# Patient Record
Sex: Male | Born: 1990
Health system: Southern US, Community
[De-identification: ages and names within clinical notes are randomized; demographics above are authoritative.]

## PROBLEM LIST (undated history)

## (undated) ENCOUNTER — Emergency Department (HOSPITAL_COMMUNITY): Disposition: A | Discharge status: Home / Self Care | Payer: Self-pay

## (undated) DIAGNOSIS — J45909 Unspecified asthma, uncomplicated: Secondary | ICD-10-CM

## (undated) DIAGNOSIS — M549 Dorsalgia, unspecified: Secondary | ICD-10-CM

## (undated) HISTORY — PX: TYMPANOSTOMY TUBE PLACEMENT: SHX32

---
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
## 2011-01-08 NOTE — Discharge Summary (Signed)
[name], [name]              ACCOUNT NO.:  [id number]   MEDICAL RECORD NO.:  [id number]          PATIENT TYPE:  INP   LOCATION:  0204                          FACILITY:  [facility]   PHYSICIAN:  Lalla Brothers, MDDATE OF BIRTH:  11/21/1990   DATE OF ADMISSION:  10/25/2005  DATE OF DISCHARGE:  11/01/2005                                 DISCHARGE SUMMARY   IDENTIFICATION:  This 67-1/20-year-old male, eighth grade student at  [employer], was admitted emergently voluntarily in  transfer from [facility] Emergency Department for inpatient  stabilization and treatment of suicide risk and dangerous manic disruptive  behavior.  The patient was injuring his head and hand by striking them on  objects despite 1-1/2 months of outpatient treatment at [facility].  He has not benefited thus far from Wellbutrin 150 mg XL  every morning and clonidine 0.2 mg at bedtime except he can get off to sleep  a little better but cannot stay asleep, becoming cumulatively sleep-  deprived.  For full details, please see the typed admission assessment.   SYNOPSIS OF PRESENT ILLNESS:  The patient had been in the emergency  department September 24, 2005 for suicidal ideation and had a positive urine  cannabis at that time.  The patient is having severe mood swings with  unpredictable loss of control and cannot contract for safety at the time of  admission.  He has a court date for stealing a wallet with the court hearing  November 01, 2005.  Last cannabis reportedly was one week ago.  He smokes one  pack per week of cigarettes for the last year.  He is stressed by father  taking 19 medications including oxygen and insulin and being depressed.  Sister is depressed.  Mother was hospitalized recently for depression,  apparently being discharged one week ago.  The patient has an angulation  deformity of the right fifth metacarpal from a boxer's fracture in November  of  2005.  He is again striking this same fist on objects.  Both parents are  on antidepressants with mother having history of alcohol abuse.  Parents  report they have bipolar disorder.  The patient reports dyspnea episodically  and uses an albuterol inhaler MDI as needed.  He is not sexually active  historically.  The patient is not employed.   INITIAL MENTAL STATUS EXAM:  There was no organicity evident.  The patient  had a highly labile mood, varying from severe dysphoria to hypomanic denial.  Manic activation was often accompanied with rage.  He was unrealistic and  inappropriate in mood for disruptiveness.  He was hypersensitive with  rejection sensitivity.  He was disorganized but had no definite  hallucinations.  He remained passively suicidal, aggressive and self-  destructive.   LABORATORY DATA:  CBC was normal except hemoconcentration with hemoglobin  15.4 with upper limit of normal 14.6 with hematocrit 45.6 with upper limit  of normal 44.  White count was normal at 10,400, MCV at 87.4 and platelet  count 376,000.  Venous blood gas was normal with pH 7.295  and PCO2 of 59  with bicarb 29.  Basic metabolic panel initially revealed sodium normal at  139, potassium 4.6, BUN 13 and random glucose 93.  Basic metabolic panel,  the following day, revealed sodium normal at 139, potassium 4.3, random  glucose 88, creatinine 0.9, calcium 9.9, albumin 4.6, AST 19, ALT 12, GGT  13.  Three days later, on Depakote 1000 mg nightly, the patient's sodium was  normal at 140, potassium 3.9, fasting glucose 89, creatinine 0.9, calcium  9.4, albumin 4, AST 17 and ALT 11.  Lipase was normal at 23.  Free T4 was  normal at 1.26 and TSH at 2.053.  After two nights of Depakote 1000 mg ER at  bedtime, the patient's 10-hour Depakote level was 59 mcg/mL.  After two  additional nights of Depakote 1500 mg ER nightly, the patient's Depakote  level trough value just before the next evening dose was 117 mcg/mL and  the  patient's Depakote was held for one day and restarted at a reduced dose.  Urine drug screen was positive for cannabis; otherwise negative including  blood alcohol and such was not quantitated.  Urinalysis was normal with  specific gravity of 1.021 and pH 7.  RPR was nonreactive.  Urine probe for  gonorrhea and chlamydia trachomatis by DNA amplification were both negative.  X-ray of the right hand in the emergency department revealed an old healed  angulated right fifth metacarpal fracture and mild soft tissue swelling over  third and fourth metacarpal; otherwise negative with a 15 degree angulation.   HOSPITAL COURSE AND TREATMENT:  General medical exam, by Yolande Jolly, P.A.-  C., noted negative exam except contusion of the right hand.  Exam was  otherwise intact and the patient denied sexual activity.  He had significant  orthostasis on admission, being underhydrated and undernourished.  His blood  pressure was 125/74 with heart rate of 54 (supine) and 88/48 with heart rate  of 80 (standing).  On the subsequent hospital day, the blood pressure was  135/72 with heart rate of 53 (supine) and 108/69 with heart rate of 94  (standing).  Vital signs gradually normalized as Depakote was titrated up  and trazodone was used p.r.n. for sleep.  Trazodone was gradually decreased  as Depakote established efficacy.  The patient developed viral pharyngitis  and gastritis like several other peers on the unit, such that his Depakote  was held.  The patient did manifest gradual progress during the hospital  stay.  By the time of discharge, clinically he was having little or no  Depakote side effects and crisis and safety plans were outlined if needed.  The patient was discharged home in improved condition.  He had no other  intraabdominal organ dysfunction.  The patient required no seclusion or  restraint during the hospital stay.  He made some definite progress, particularly in his ability to be  sincere and verbal in family sessions with  parents.  He had to work through his demands to leave which he generally  directed towards parents several times but, by the time of discharge, he had  disengaged from such coping by anger and manipulation.  They addressed  specifically improved family communication and function without substance  use by the time of discharge.  The patient had court to attend and then  would have two days before he returned to school in order to reestablish  function with the family.  Depakote was decreased at the time of discharge.  He  required no seclusion or restraint during the hospital stay.   FINAL DIAGNOSES:  AXIS I:  Bipolar disorder, mixed, severe.  Oppositional  defiant disorder.  Cannabis abuse.  Parent-child problem.  Other specified  family circumstances.  Rule out attention-deficit hyperactivity disorder  (provisional diagnosis).  AXIS II:  Diagnosis deferred.  AXIS III:  Contusion, right hand with healed angulated right fifth  metacarpal fracture from the past, cigarette and cannabis smoking with  episodic dyspnea, to rule out asthma.  AXIS IV:  Stressors:  Family--severe, acute and chronic; phase of life--  severe, acute and chronic; school--moderate, acute and chronic.  AXIS V:  GAF on admission 36; highest in the last year 65; discharge GAF 52.   CONDITION ON DISCHARGE:  The patient is discharged to parents in improved  condition, free of suicidal ideation.   ACTIVITY/DIET:  He follows a regular diet and has no restrictions on  physical activity.  Crisis and safety plans are outlined if needed.  He is  prescribed the following medication.   DISCHARGE MEDICATIONS:  1.  Depakote 500 mg ER, to take 2 tablets every bedtime; quantity #60 with      no refill prescribed.  2.  Trazodone 50 mg tablet, to take 1/2 or 1 at bedtime if needed for      insomnia, quantity #30 with no refill.  3.  He has albuterol inhaler at home, to follow established  directions if      needed.   Wellbutrin and clonidine were discontinued.   FOLLOW UP:  He will see Eugenia Mcalpine November 05, 2005 at 11 a.m. for therapy.  He will see Dr. Lucianne Muss for medication management November 02, 2005 at 10:45 a.m.      Lalla Brothers, MD  Electronically Signed     GEJ/MEDQ  D:  11/05/2005  T:  11/06/2005  Job:  [id number]   cc:   Dr. Leander Rams [facility]  [address]  [place], [place]  fax [phone]   Eugenia Mcalpine  [facility]  [address]  [place], [place]  fax [phone]

## 2011-05-14 LAB — DIFFERENTIAL
Basophils Absolute: 0
Basophils Relative: 0
Eosinophils Absolute: 0.1
Eosinophils Relative: 2
Lymphocytes Relative: 24
Lymphs Abs: 1.7
Monocytes Absolute: 0.7
Monocytes Relative: 10
Neutro Abs: 4.5
Neutrophils Relative %: 64

## 2011-05-14 LAB — CBC
HCT: 44.8
Hemoglobin: 16
MCHC: 35.7
MCV: 85.6
Platelets: 244
RBC: 5.23
RDW: 12.6
WBC: 7.1

## 2011-05-14 LAB — BASIC METABOLIC PANEL
BUN: 13
CO2: 25
Calcium: 9.5
Chloride: 101
Creatinine, Ser: 0.91
Glucose, Bld: 92
Potassium: 3.3 — ABNORMAL LOW
Sodium: 136

## 2011-05-14 LAB — URINALYSIS, ROUTINE W REFLEX MICROSCOPIC
Glucose, UA: NEGATIVE
Hgb urine dipstick: NEGATIVE
Ketones, ur: 15 — AB
Leukocytes, UA: NEGATIVE
Nitrite: NEGATIVE
Protein, ur: 30 — AB
Specific Gravity, Urine: >1.03 — ABNORMAL HIGH
Urobilinogen, UA: 0.2
pH: 6

## 2011-05-14 LAB — RAPID URINE DRUG SCREEN, HOSP PERFORMED
Amphetamines: NOT DETECTED
Barbiturates: NOT DETECTED
Benzodiazepines: NOT DETECTED
Cocaine: NOT DETECTED
Opiates: NOT DETECTED
Tetrahydrocannabinol: POSITIVE — AB

## 2011-05-14 LAB — URINE MICROSCOPIC-ADD ON

## 2011-05-14 LAB — VALPROIC ACID LEVEL: Valproic Acid Lvl: 45 — ABNORMAL LOW

## 2011-05-14 LAB — ETHANOL: Alcohol, Ethyl (B): <5

## 2011-06-07 LAB — DIFFERENTIAL
Basophils Absolute: 0
Eosinophils Relative: 1
Lymphocytes Relative: 22 — ABNORMAL LOW
Lymphs Abs: 1.6
Neutro Abs: 4.8

## 2011-06-07 LAB — URINALYSIS, ROUTINE W REFLEX MICROSCOPIC
Bilirubin Urine: NEGATIVE
Glucose, UA: NEGATIVE
Hgb urine dipstick: NEGATIVE
Ketones, ur: NEGATIVE
Nitrite: NEGATIVE
Protein, ur: NEGATIVE
Specific Gravity, Urine: 1.028
Urobilinogen, UA: 1
pH: 6

## 2011-06-07 LAB — RAPID URINE DRUG SCREEN, HOSP PERFORMED
Amphetamines: NOT DETECTED
Tetrahydrocannabinol: POSITIVE — AB

## 2011-06-07 LAB — HEPATIC FUNCTION PANEL
ALT: 12
ALT: 12
AST: 14
AST: 18
Albumin: 3.8
Albumin: 4
Alkaline Phosphatase: 69 — ABNORMAL LOW
Bilirubin, Direct: 0.1
Bilirubin, Direct: 0.2
Indirect Bilirubin: 1.3 — ABNORMAL HIGH
Total Bilirubin: 0.5
Total Bilirubin: 1.5 — ABNORMAL HIGH
Total Protein: 7.1

## 2011-06-07 LAB — CBC
HCT: 49 — ABNORMAL HIGH
Platelets: 288
RDW: 12.7
WBC: 7.2

## 2011-06-07 LAB — LIPID PANEL
Cholesterol: 152
HDL: 31 — ABNORMAL LOW
LDL Cholesterol: 89
Total CHOL/HDL Ratio: 4.9
Triglycerides: 161 — ABNORMAL HIGH
VLDL: 32

## 2011-06-07 LAB — RPR: RPR Ser Ql: NONREACTIVE

## 2011-06-07 LAB — TSH: TSH: 1.284

## 2011-06-07 LAB — BASIC METABOLIC PANEL
BUN: 10
Calcium: 9.7
Potassium: 3.4 — ABNORMAL LOW
Sodium: 137

## 2011-06-07 LAB — GAMMA GT: GGT: 12

## 2011-06-07 LAB — GC/CHLAMYDIA PROBE AMP, URINE
Chlamydia, Swab/Urine, PCR: NEGATIVE
GC Probe Amp, Urine: NEGATIVE

## 2011-06-07 LAB — T4, FREE: Free T4: 1.12

## 2011-06-07 LAB — ETHANOL: Alcohol, Ethyl (B): <5

## 2011-06-07 LAB — RAPID STREP SCREEN (MED CTR MEBANE ONLY): Streptococcus, Group A Screen (Direct): NEGATIVE

## 2013-02-25 ENCOUNTER — Encounter (HOSPITAL_COMMUNITY): Payer: Self-pay | Admitting: Emergency Medicine

## 2013-02-25 ENCOUNTER — Emergency Department (HOSPITAL_COMMUNITY)
Admission: EM | Admit: 2013-02-25 | Discharge: 2013-02-26 | Disposition: A | Discharge status: Home / Self Care | Payer: Self-pay | Attending: Emergency Medicine | Admitting: Emergency Medicine

## 2013-02-25 DIAGNOSIS — Y929 Unspecified place or not applicable: Secondary | ICD-10-CM | POA: Insufficient documentation

## 2013-02-25 DIAGNOSIS — Y9319 Activity, other involving water and watercraft: Secondary | ICD-10-CM | POA: Insufficient documentation

## 2013-02-25 DIAGNOSIS — H7321 Unspecified myringitis, right ear: Secondary | ICD-10-CM

## 2013-02-25 DIAGNOSIS — S199XXA Unspecified injury of neck, initial encounter: Secondary | ICD-10-CM | POA: Insufficient documentation

## 2013-02-25 DIAGNOSIS — S0993XA Unspecified injury of face, initial encounter: Secondary | ICD-10-CM | POA: Insufficient documentation

## 2013-02-25 DIAGNOSIS — H7290 Unspecified perforation of tympanic membrane, unspecified ear: Secondary | ICD-10-CM | POA: Insufficient documentation

## 2013-02-25 DIAGNOSIS — W1809XA Striking against other object with subsequent fall, initial encounter: Secondary | ICD-10-CM | POA: Insufficient documentation

## 2013-02-25 MED ORDER — ANTIPYRINE-BENZOCAINE 5.4-1.4 % OT SOLN: 3.0000 [drp] | Freq: Four times a day (QID) | OTIC | Status: DC | PRN

## 2013-02-25 MED ORDER — NAPROXEN 500 MG PO TABS: 500.0000 mg | ORAL_TABLET | Freq: Two times a day (BID) | ORAL | Status: DC

## 2013-02-25 MED ORDER — ONDANSETRON 4 MG PO TBDP
8.0000 mg | ORAL_TABLET | Freq: Once | ORAL | Status: AC
  Administered 2013-02-26: 8 mg via ORAL
  Filled 2013-02-25: qty 2

## 2013-02-25 MED ORDER — OXYCODONE-ACETAMINOPHEN 5-325 MG PO TABS
2.0000 | ORAL_TABLET | Freq: Once | ORAL | Status: AC
  Administered 2013-02-25: 2 via ORAL
  Filled 2013-02-25: qty 2

## 2013-02-25 MED ORDER — HYDROCODONE-ACETAMINOPHEN 5-325 MG PO TABS: 2.0000 | ORAL_TABLET | ORAL | Status: DC | PRN
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
## 2013-07-20 ENCOUNTER — Encounter (HOSPITAL_BASED_OUTPATIENT_CLINIC_OR_DEPARTMENT_OTHER): Payer: Self-pay | Admitting: Emergency Medicine

## 2013-07-20 ENCOUNTER — Emergency Department (HOSPITAL_BASED_OUTPATIENT_CLINIC_OR_DEPARTMENT_OTHER)
Admission: EM | Admit: 2013-07-20 | Discharge: 2013-07-20 | Disposition: A | Discharge status: Home / Self Care | Payer: Self-pay | Attending: Emergency Medicine | Admitting: Emergency Medicine

## 2013-07-20 ENCOUNTER — Emergency Department (HOSPITAL_BASED_OUTPATIENT_CLINIC_OR_DEPARTMENT_OTHER): Payer: Self-pay

## 2013-07-20 DIAGNOSIS — X500XXA Overexertion from strenuous movement or load, initial encounter: Secondary | ICD-10-CM | POA: Insufficient documentation

## 2013-07-20 DIAGNOSIS — Z791 Long term (current) use of non-steroidal anti-inflammatories (NSAID): Secondary | ICD-10-CM | POA: Insufficient documentation

## 2013-07-20 DIAGNOSIS — S93602A Unspecified sprain of left foot, initial encounter: Secondary | ICD-10-CM

## 2013-07-20 DIAGNOSIS — Y9239 Other specified sports and athletic area as the place of occurrence of the external cause: Secondary | ICD-10-CM | POA: Insufficient documentation

## 2013-07-20 DIAGNOSIS — Y9351 Activity, roller skating (inline) and skateboarding: Secondary | ICD-10-CM | POA: Insufficient documentation

## 2013-07-20 DIAGNOSIS — S93609A Unspecified sprain of unspecified foot, initial encounter: Secondary | ICD-10-CM | POA: Insufficient documentation

## 2013-07-20 DIAGNOSIS — F17200 Nicotine dependence, unspecified, uncomplicated: Secondary | ICD-10-CM | POA: Insufficient documentation

## 2013-07-20 IMAGING — CR DG ANKLE COMPLETE 3+V*L*
3 series · 3 of 3 positions shown · non-contrast
Comparison: [DATE]

CLINICAL DATA: Skateboarding injury, ankle pain

EXAM:
LEFT ANKLE COMPLETE - 3+ VIEW

[t ankle joint ap left]
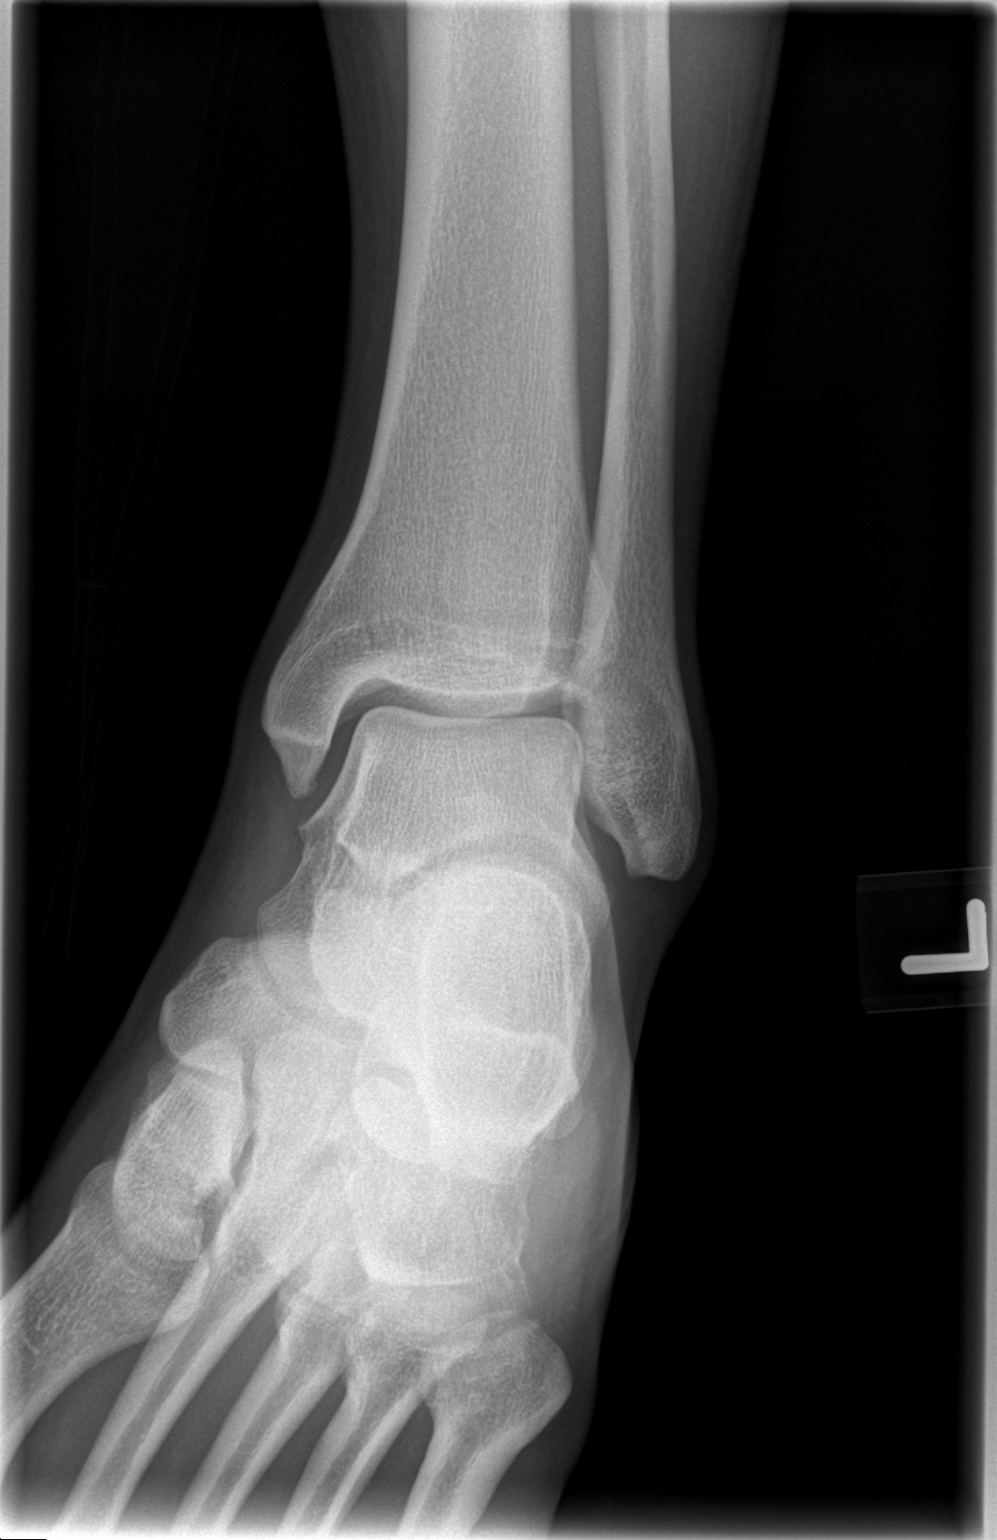

[t ankle joint oblique left]
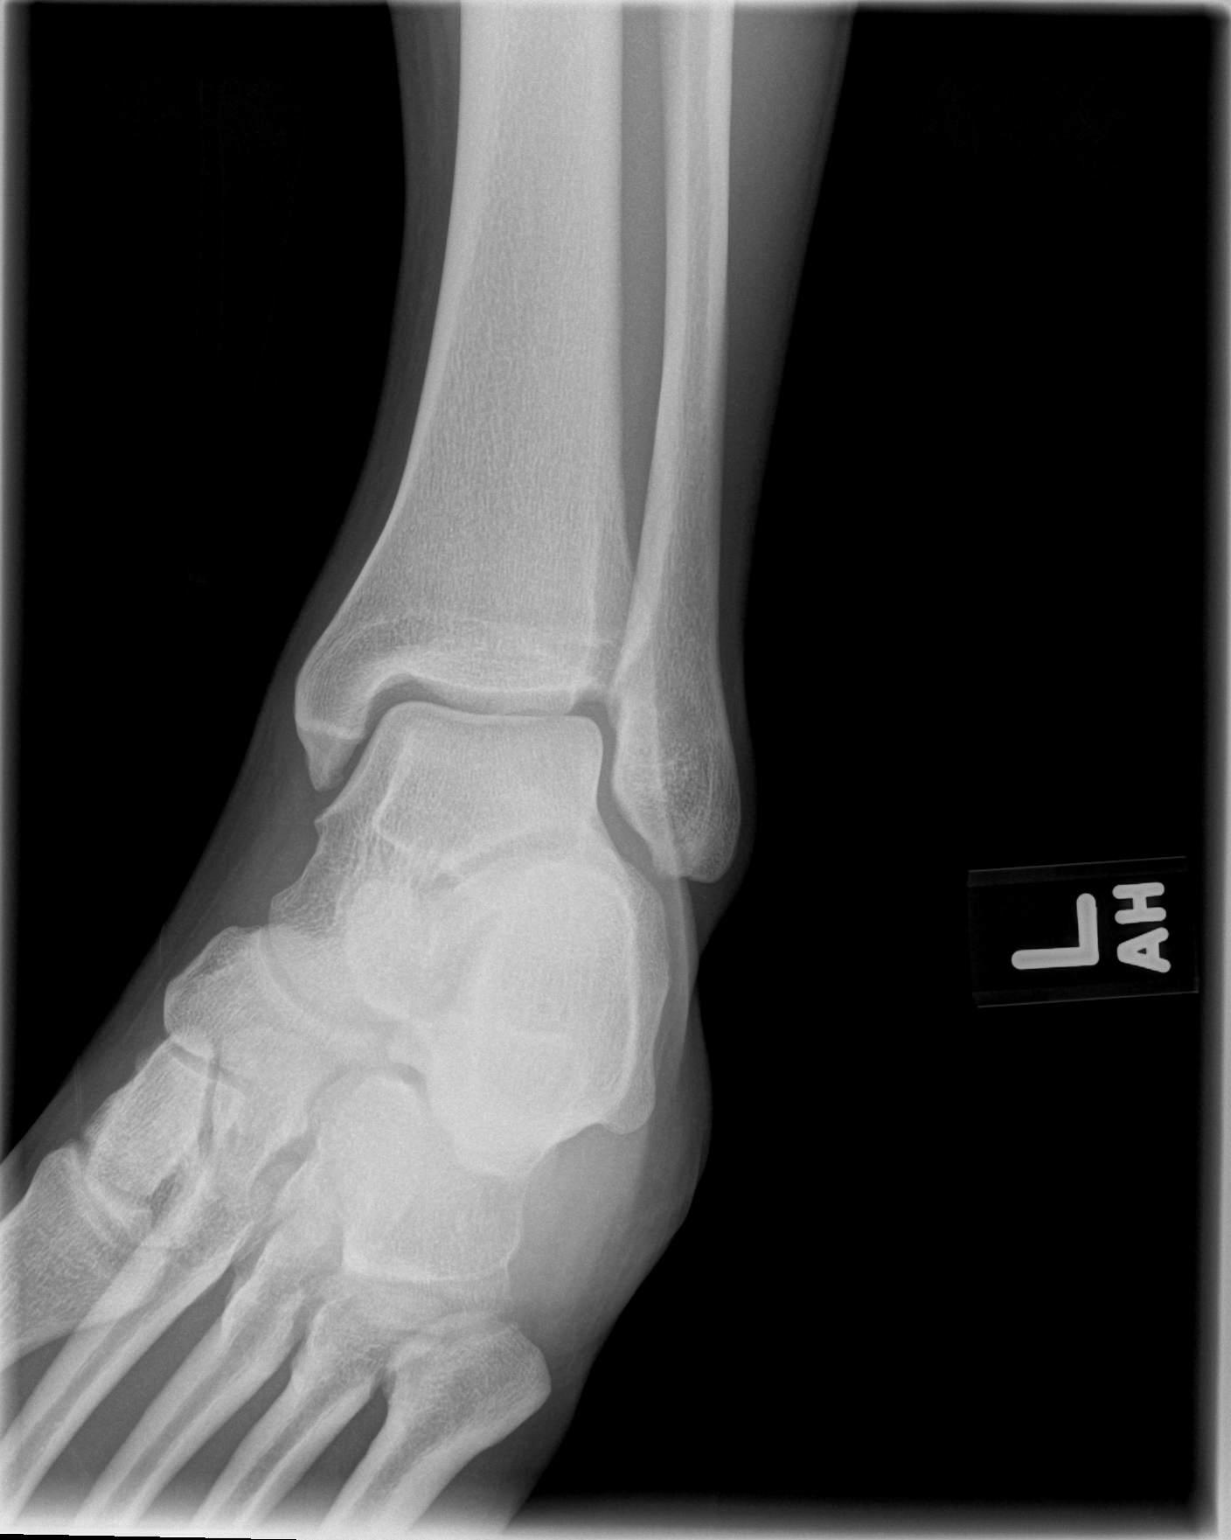

[t ankle joint lat left]
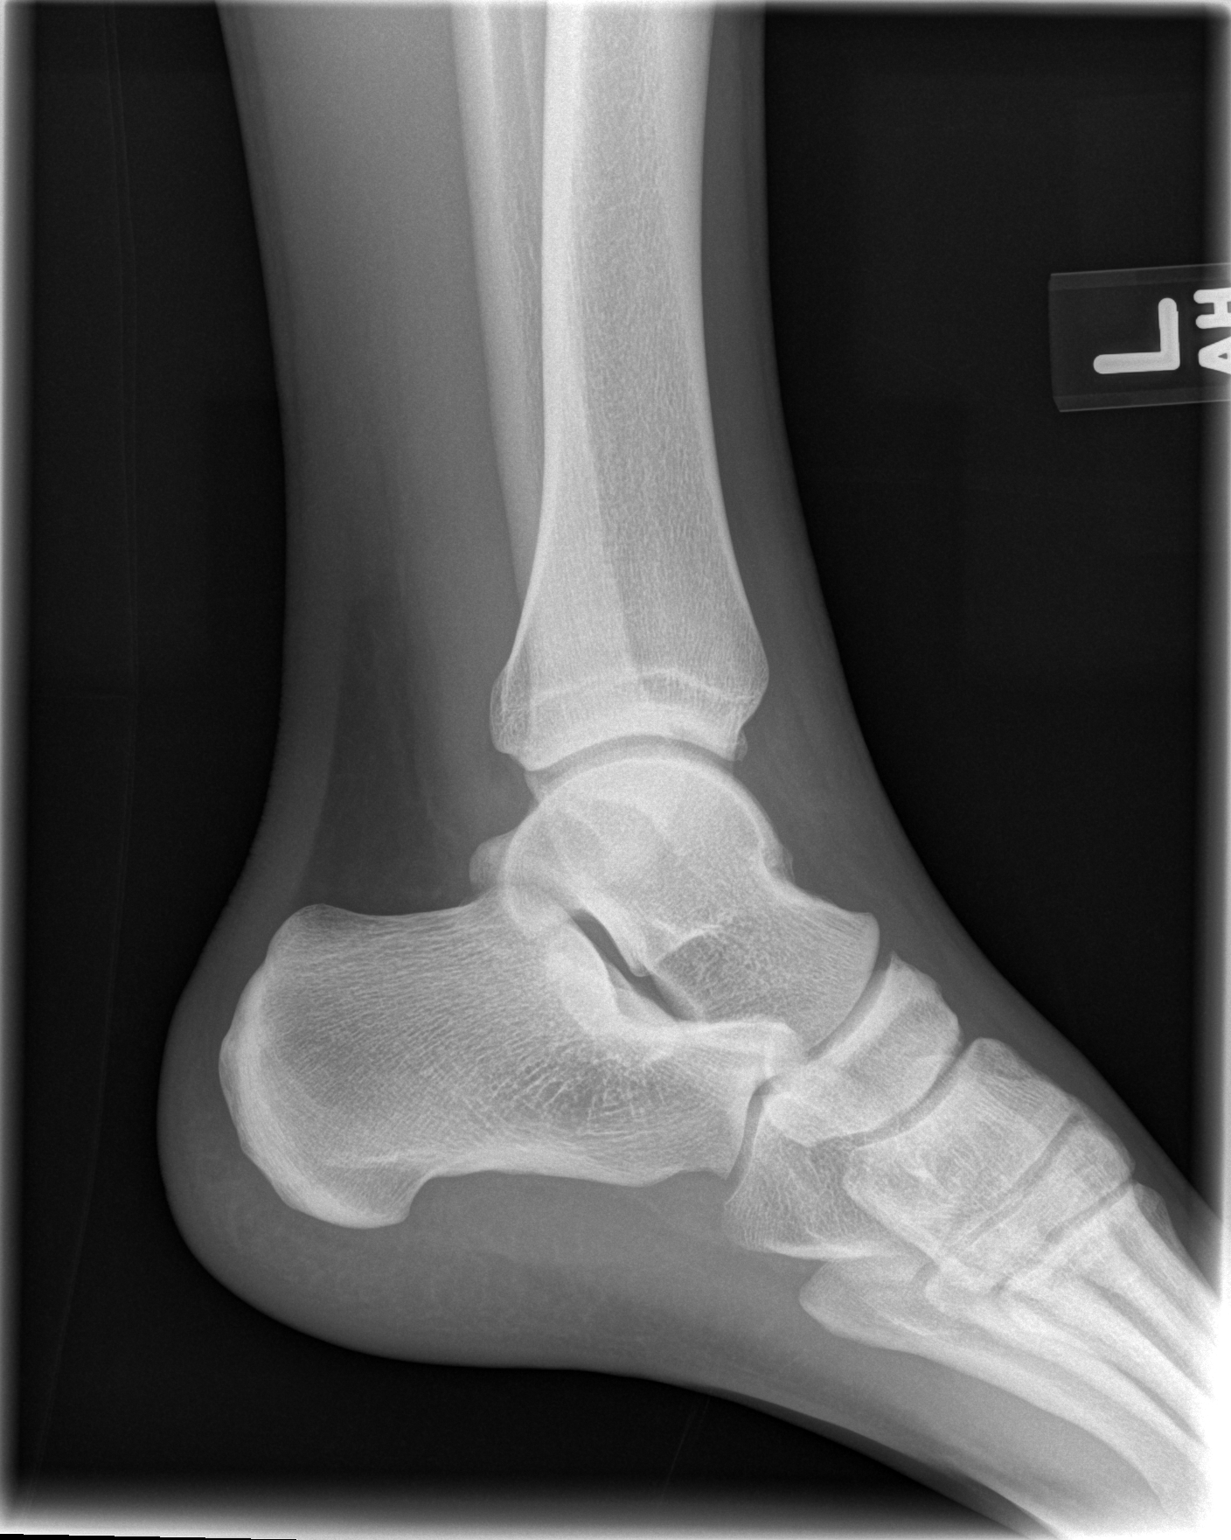

[3 of 3 positions shown; findings below may reference images not displayed]

FINDINGS: No fracture or dislocation is seen.

The ankle mortise is intact.

The base of the fifth metatarsal is unremarkable.

The visualized soft tissues are unremarkable.
IMPRESSION: No fracture or dislocation is seen.

## 2013-07-20 MED ORDER — IBUPROFEN 800 MG PO TABS: 800.0000 mg | ORAL_TABLET | Freq: Three times a day (TID) | ORAL | Status: DC

## 2013-07-20 MED ORDER — HYDROCODONE-ACETAMINOPHEN 5-325 MG PO TABS
2.0000 | ORAL_TABLET | Freq: Once | ORAL | Status: AC
  Administered 2013-07-20: 2 via ORAL
  Filled 2013-07-20: qty 2

## 2013-07-20 MED ORDER — HYDROCODONE-ACETAMINOPHEN 5-325 MG PO TABS: 2.0000 | ORAL_TABLET | ORAL | Status: DC | PRN

## 2013-07-20 NOTE — ED Provider Notes (Signed)
CSN: [id number]     Arrival date & time 07/20/13  2005 History   First MD Initiated Contact with Patient 07/20/13 2138     Chief Complaint  Patient presents with  . Ankle Injury   (Consider location/radiation/quality/duration/timing/severity/associated sxs/prior Treatment) Patient is a 22 y.o. male presenting with lower extremity injury. The history is provided by the patient. No language interpreter was used.  Ankle Injury This is a new problem. The current episode started today. The problem occurs constantly. The problem has been gradually worsening. Associated symptoms include joint swelling and myalgias. Nothing aggravates the symptoms. He has tried nothing for the symptoms. The treatment provided no relief.  Pt turned his ankle while skateboarding  History reviewed. No pertinent past medical history. History reviewed. No pertinent past surgical history. No family history on file. History  Substance Use Topics  . Smoking status: Current Every Day Smoker  . Smokeless tobacco: Not on file  . Alcohol Use: No    Review of Systems  Musculoskeletal: Positive for joint swelling and myalgias.  All other systems reviewed and are negative.    Allergies  Review of patient's allergies indicates no known allergies.  Home Medications   Current Outpatient Rx  Name  Route  Sig  Dispense  Refill  . antipyrine-benzocaine (AURALGAN) otic solution   Right Ear   Place 3 drops into the right ear 4 (four) times daily as needed for pain.   10 mL   0   . HYDROcodone-acetaminophen (NORCO/VICODIN) 5-325 MG per tablet   Oral   Take 2 tablets by mouth every 4 (four) hours as needed for pain.   10 tablet   0   . naproxen (NAPROSYN) 500 MG tablet   Oral   Take 1 tablet (500 mg total) by mouth 2 (two) times daily with a meal.   30 tablet   0    BP 107/62  Pulse 97  Temp(Src) 97.9 F (36.6 C) (Oral)  Resp 16  Ht 5\' 10"  (1.778 m)  Wt 150 lb (68.04 kg)  BMI 21.52 kg/m2  SpO2  99% Physical Exam  Nursing note and vitals reviewed. Constitutional: He appears well-developed and well-nourished.  HENT:  Head: Normocephalic.  Eyes: Pupils are equal, round, and reactive to light.  Cardiovascular: Normal rate.   Musculoskeletal: He exhibits tenderness.  Tender base of 5th metacarpal and foot  Neurological: He is alert.  Skin: Skin is warm.  Psychiatric: He has a normal mood and affect.    ED Course  Procedures (including critical care time) Labs Review Labs Reviewed - No data to display Imaging Review Dg Ankle Complete Left  07/20/2013   CLINICAL DATA:  Skateboarding injury, ankle pain  EXAM: LEFT ANKLE COMPLETE - 3+ VIEW  COMPARISON:  04/04/2005  FINDINGS: No fracture or dislocation is seen.  The ankle mortise is intact.  The base of the fifth metatarsal is unremarkable.  The visualized soft tissues are unremarkable.  IMPRESSION: No fracture or dislocation is seen.   Electronically Signed   By: Charline Bills M.D.   On: 07/20/2013 20:52    EKG Interpretation   None       MDM   1. Foot sprain, left, initial encounter    Ace wrap,  Post op shoe,  Crutches,  Follow up with Dr. Pearletha Forge for recheck in 1 week    Elson Areas, PA-C 07/20/13 2204

## 2013-07-20 NOTE — ED Notes (Signed)
Left ankle injury while skating approx 1 hour PTA

## 2013-07-20 NOTE — ED Provider Notes (Signed)
Medical screening examination/treatment/procedure(s) were performed by non-physician practitioner and as supervising physician I was immediately available for consultation/collaboration.  EKG Interpretation   None         William Clemente Dewey, MD 07/20/13 2310 

## 2014-05-14 ENCOUNTER — Encounter (HOSPITAL_COMMUNITY): Payer: Self-pay | Admitting: Emergency Medicine

## 2014-05-14 ENCOUNTER — Emergency Department (HOSPITAL_COMMUNITY)
Admission: EM | Admit: 2014-05-14 | Discharge: 2014-05-14 | Disposition: A | Discharge status: Home / Self Care | Payer: Self-pay | Attending: Emergency Medicine | Admitting: Emergency Medicine

## 2014-05-14 ENCOUNTER — Emergency Department (HOSPITAL_COMMUNITY): Payer: Self-pay

## 2014-05-14 DIAGNOSIS — Z791 Long term (current) use of non-steroidal anti-inflammatories (NSAID): Secondary | ICD-10-CM | POA: Insufficient documentation

## 2014-05-14 DIAGNOSIS — R05 Cough: Secondary | ICD-10-CM | POA: Insufficient documentation

## 2014-05-14 DIAGNOSIS — F17200 Nicotine dependence, unspecified, uncomplicated: Secondary | ICD-10-CM | POA: Insufficient documentation

## 2014-05-14 DIAGNOSIS — J069 Acute upper respiratory infection, unspecified: Secondary | ICD-10-CM | POA: Insufficient documentation

## 2014-05-14 DIAGNOSIS — J40 Bronchitis, not specified as acute or chronic: Secondary | ICD-10-CM | POA: Insufficient documentation

## 2014-05-14 DIAGNOSIS — R059 Cough, unspecified: Secondary | ICD-10-CM | POA: Insufficient documentation

## 2014-05-14 IMAGING — CR DG CHEST 2V
2 series · 2 of 2 positions shown · non-contrast
Comparison: [DATE].

CLINICAL DATA: Cough.

EXAM:
CHEST  2 VIEW

[view not recorded (1 of 2)]
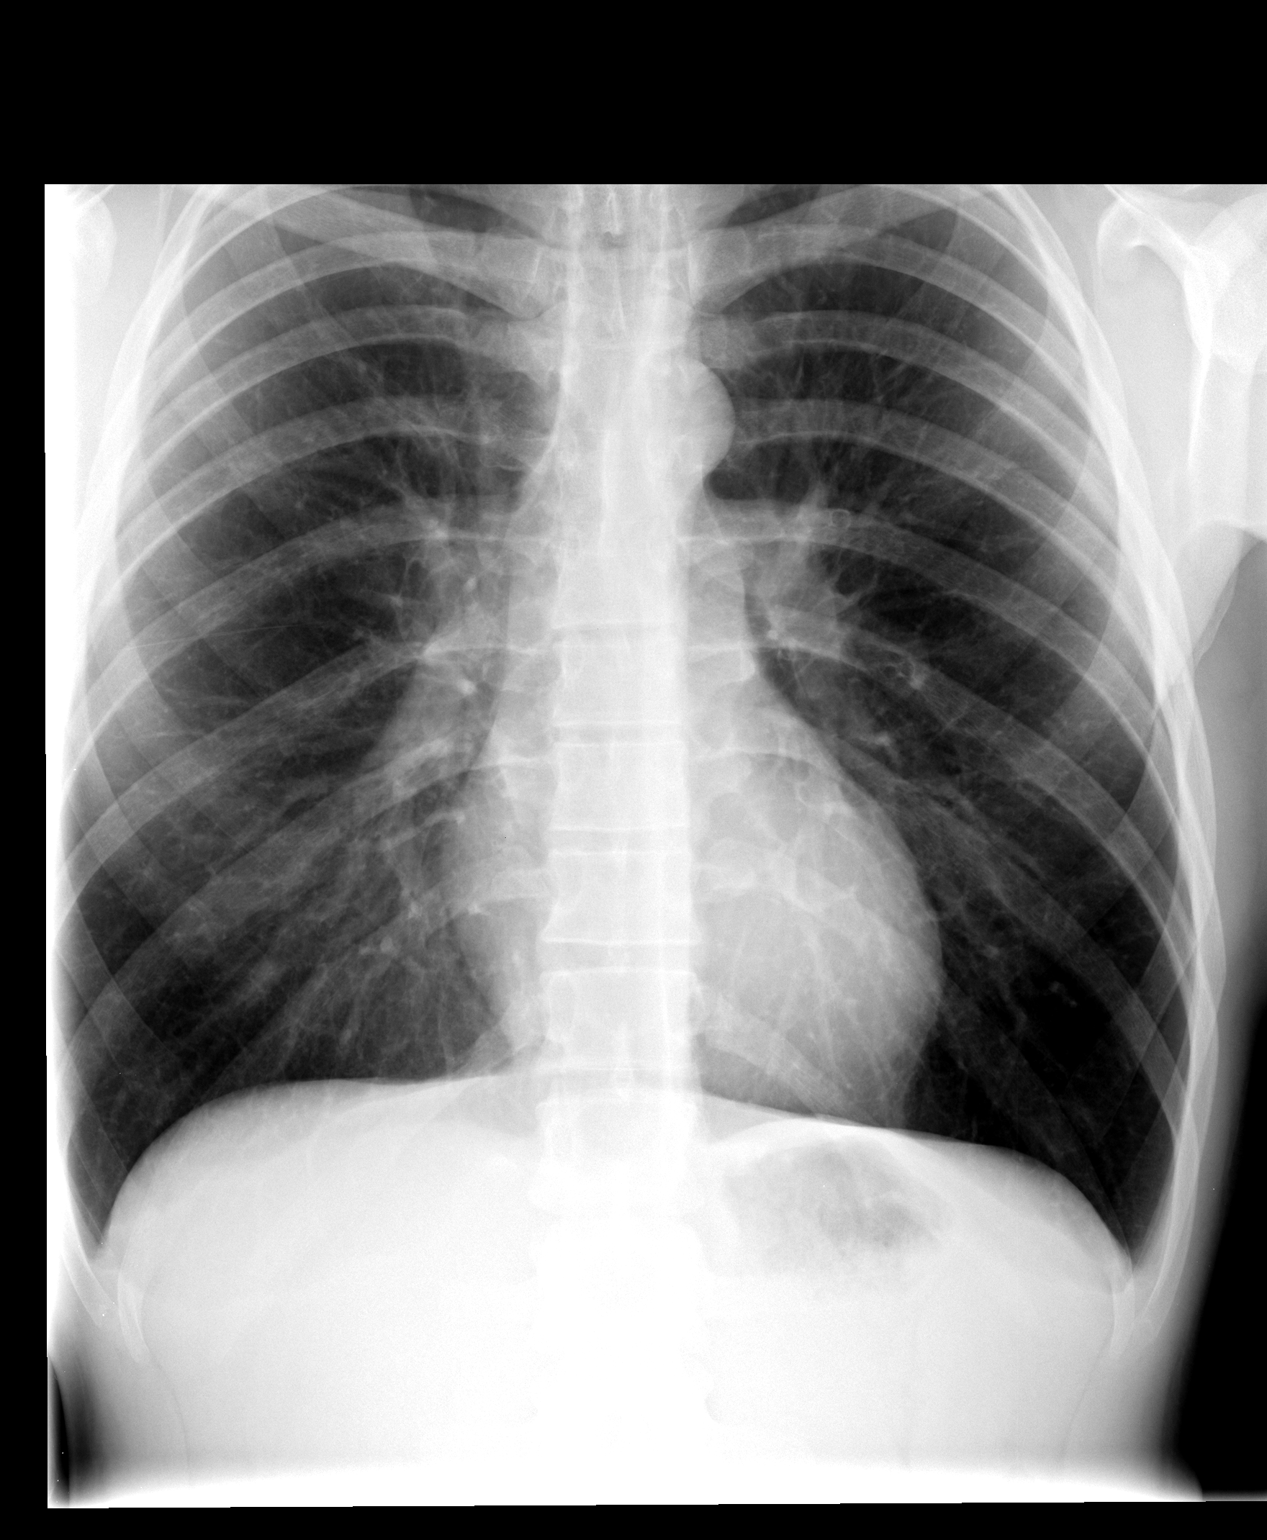

[view not recorded (2 of 2)]
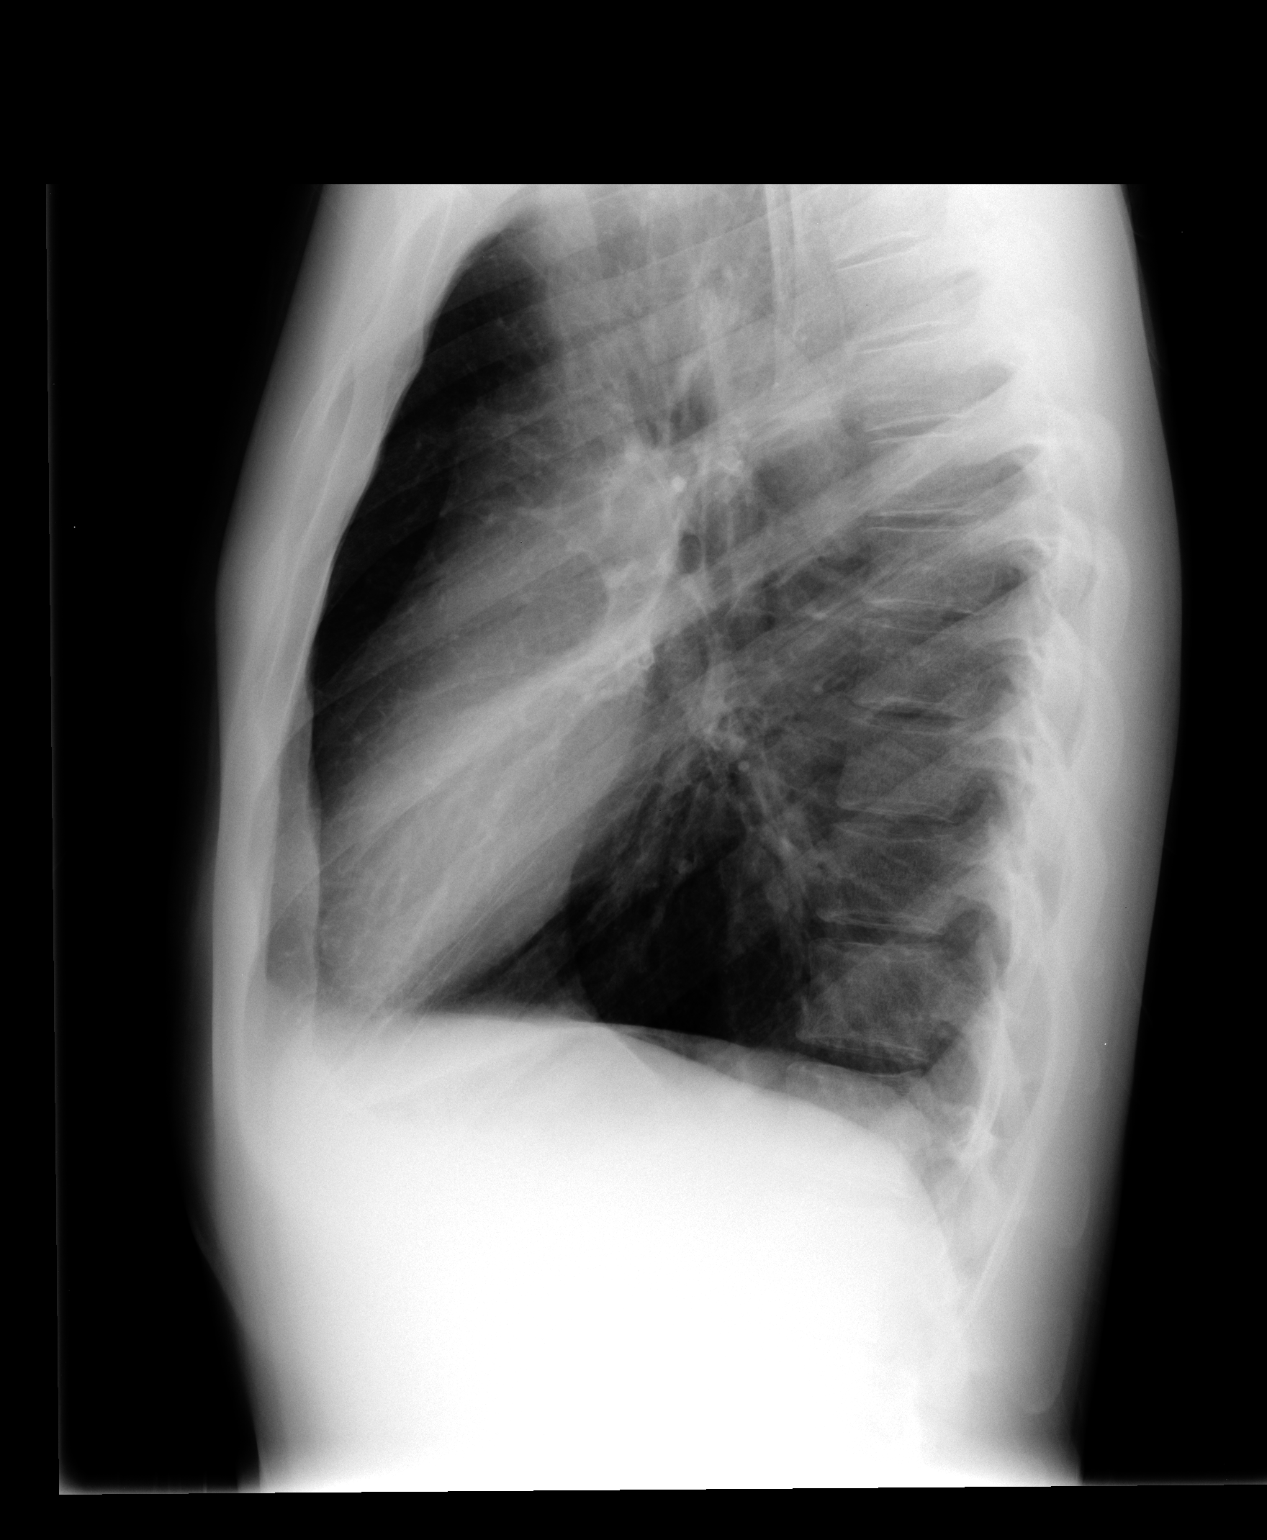

[2 of 2 positions shown; findings below may reference images not displayed]

FINDINGS: The heart size and mediastinal contours are within normal limits. No
pneumothorax or pleural effusion is noted. Mild hyperexpansion of
the lungs is noted. Both lungs are clear. The visualized skeletal
structures are unremarkable.
IMPRESSION: Mild hyperexpansion of the lungs. No acute cardiopulmonary
abnormality seen.

## 2014-05-14 MED ORDER — DEXAMETHASONE 4 MG PO TABS: ORAL_TABLET | ORAL | Status: DC

## 2014-05-14 MED ORDER — PSEUDOEPHEDRINE HCL ER 120 MG PO TB12: 120.0000 mg | ORAL_TABLET | Freq: Two times a day (BID) | ORAL | Status: DC

## 2014-05-14 MED ORDER — IBUPROFEN 800 MG PO TABS
800.0000 mg | ORAL_TABLET | Freq: Once | ORAL | Status: AC
  Administered 2014-05-14: 800 mg via ORAL
  Filled 2014-05-14: qty 1

## 2014-05-14 MED ORDER — HYDROCOD POLST-CHLORPHEN POLST 10-8 MG/5ML PO LQCR
5.0000 mL | Freq: Once | ORAL | Status: AC
  Administered 2014-05-14: 5 mL via ORAL
  Filled 2014-05-14: qty 5

## 2014-05-14 MED ORDER — PROMETHAZINE-CODEINE 6.25-10 MG/5ML PO SYRP: 5.0000 mL | ORAL_SOLUTION | Freq: Four times a day (QID) | ORAL | Status: DC | PRN

## 2014-05-14 MED ORDER — PREDNISONE 10 MG PO TABS
60.0000 mg | ORAL_TABLET | Freq: Once | ORAL | Status: AC
  Administered 2014-05-14: 60 mg via ORAL
  Filled 2014-05-14 (×2): qty 1

## 2014-05-14 NOTE — ED Provider Notes (Signed)
CSN: [id number]     Arrival date & time 05/14/14  1543 History   First MD Initiated Contact with Patient 05/14/14 1631     Chief Complaint  Patient presents with  . Cough     (Consider location/radiation/quality/duration/timing/severity/associated sxs/prior Treatment) Patient is a 23 y.o. male presenting with cough. The history is provided by the patient.  Cough Cough characteristics:  Productive Sputum characteristics:  Yellow Severity:  Moderate Onset quality:  Gradual Duration:  1 day Timing:  Intermittent Progression:  Worsening Chronicity:  New Smoker: yes   Context: sick contacts   Relieved by:  Nothing Ineffective treatments:  None tried Associated symptoms: no chest pain, no eye discharge, no fever, no shortness of breath and no wheezing   Risk factors: no recent travel     History reviewed. No pertinent past medical history. Past Surgical History  Procedure Laterality Date  . Tympanostomy tube placement     History reviewed. No pertinent family history. History  Substance Use Topics  . Smoking status: Current Every Day Smoker  . Smokeless tobacco: Not on file  . Alcohol Use: No    Review of Systems  Constitutional: Negative for fever and activity change.       All ROS Neg except as noted in HPI  HENT: Negative for nosebleeds.   Eyes: Negative for photophobia and discharge.  Respiratory: Positive for cough. Negative for shortness of breath and wheezing.   Cardiovascular: Negative for chest pain and palpitations.  Gastrointestinal: Negative for abdominal pain and blood in stool.  Genitourinary: Negative for dysuria, frequency and hematuria.  Musculoskeletal: Negative for arthralgias, back pain and neck pain.  Skin: Negative.   Neurological: Negative for dizziness, seizures and speech difficulty.  Psychiatric/Behavioral: Negative for hallucinations and confusion.      Allergies  Review of patient's allergies indicates no known allergies.  Home  Medications   Prior to Admission medications   Medication Sig Start Date End Date Taking? Authorizing Provider  antipyrine-benzocaine Lyla Son) otic solution Place 3 drops into the right ear 4 (four) times daily as needed for pain. 02/25/13   Vida Roller, MD  HYDROcodone-acetaminophen (NORCO/VICODIN) 5-325 MG per tablet Take 2 tablets by mouth every 4 (four) hours as needed for pain. 02/25/13   Vida Roller, MD  HYDROcodone-acetaminophen (NORCO/VICODIN) 5-325 MG per tablet Take 2 tablets by mouth every 4 (four) hours as needed. 07/20/13   Elson Areas, PA-C  ibuprofen (ADVIL,MOTRIN) 800 MG tablet Take 1 tablet (800 mg total) by mouth 3 (three) times daily. 07/20/13   Elson Areas, PA-C  naproxen (NAPROSYN) 500 MG tablet Take 1 tablet (500 mg total) by mouth 2 (two) times daily with a meal. 02/25/13   Vida Roller, MD   BP 124/73  Pulse 99  Temp(Src) 98.4 F (36.9 C) (Oral)  Resp 16  Ht  (1.778 m)  Wt 150 lb (68.04 kg)  BMI 21.52 kg/m2  SpO2 100% Physical Exam  Nursing note and vitals reviewed. Constitutional: He is oriented to person, place, and time. He appears well-developed and well-nourished.  Non-toxic appearance.  HENT:  Head: Normocephalic.  Right Ear: Tympanic membrane and external ear normal.  Left Ear: Tympanic membrane and external ear normal.  Nasal congestion.  Eyes: EOM and lids are normal. Pupils are equal, round, and reactive to light.  Neck: Normal range of motion. Neck supple. Carotid bruit is not present.  Cardiovascular: Normal rate, regular rhythm, normal heart sounds, intact distal pulses and normal pulses.  Pulmonary/Chest: Breath sounds normal. No respiratory distress. He exhibits tenderness.  Course breath sounds. No consolidation. Pt speaks in complete sentences.  Abdominal: Soft. Bowel sounds are normal. There is no tenderness. There is no guarding.  Musculoskeletal: Normal range of motion.  Lymphadenopathy:       Head (right side): No  submandibular adenopathy present.       Head (left side): No submandibular adenopathy present.    He has no cervical adenopathy.  Neurological: He is alert and oriented to person, place, and time. He has normal strength. No cranial nerve deficit or sensory deficit.  Skin: Skin is warm and dry.  Psychiatric: He has a normal mood and affect. His speech is normal.    ED Course  Procedures (including critical care time) Labs Review Labs Reviewed - No data to display  Imaging Review Dg Chest 2 View  05/14/2014   CLINICAL DATA:  Cough.  EXAM: CHEST  2 VIEW  COMPARISON:  May 08, 2007.  FINDINGS: The heart size and mediastinal contours are within normal limits. No pneumothorax or pleural effusion is noted. Mild hyperexpansion of the lungs is noted. Both lungs are clear. The visualized skeletal structures are unremarkable.  IMPRESSION: Mild hyperexpansion of the lungs. No acute cardiopulmonary abnormality seen.   Electronically Signed   By: Roque Lias M.D.   On: 05/14/2014 16:40     EKG Interpretation None      MDM He patient denies any history of aspiration. There's been no injury or trauma to the chest, or airway. The patient has not had high fevers. There's been no hemoptysis reported. The patient does state he has a lot of nasal congestion and sinus pressure.  Chest x-ray is negative for acute changes. There is some hyperexpansion of the lungs present. The patient speaks in complete sentences throughout his emergency department visit.  The plan at this time is for the patient to be treated with Tylenol every 4 hours, or ibuprofen every 6 hours. The patient is to use promethazine codeine cough medication, Sudafed, and Decadron. Patient is to see his primary physician, or return to the emergency department if no changes or improvement.    Final diagnoses:  None    **I have reviewed nursing notes, vital signs, and all appropriate lab and imaging results for this  patient.Kathie Dike, PA-C 05/14/14 1715

## 2014-05-14 NOTE — Discharge Instructions (Signed)
Cough, Adult  PLEASE INCREASE WATER AND JUICES. PLEASE USE TYLENOL EVERY 4 HOURS, OR IBUPROFEN EVERY 6 HOURS FOR FEVERS AND ACHING. USE PRESCRIBED MEDICATIONS AS SUGGESTED. PROMETHAZINE-CODEINE COUGH MEDICATION MAY CAUSE DROWSINESS,DO NOT DRIVE, OPERATE MACHINES, OR PARTICIPATE IN ACTIVITY THAT REQUIRES STEADY FOCUS.                                                                                                                                      Cough is a reflex. It helps you clear your throat and airways. A cough can help heal your body. A cough can last 2 or 3 weeks (acute) or may last more than 8 weeks (chronic). Some common causes of a cough can include an infection, allergy, or a cold. HOME CARE  Only take medicine as told by your doctor.  If given, take your medicines (antibiotics) as told. Finish them even if you start to feel better.  Use a cold steam vaporizer or humidifier in your home. This can help loosen thick spit (secretions).  Sleep so you are almost sitting up (semi-upright). Use pillows to do this. This helps reduce coughing.  Rest as needed.  Stop smoking if you smoke. GET HELP RIGHT AWAY IF:  You have yellowish-white fluid (pus) in your thick spit.  Your cough gets worse.  Your medicine does not reduce coughing, and you are losing sleep.  You cough up blood.  You have trouble breathing.  Your pain gets worse and medicine does not help.  You have a fever. MAKE SURE YOU:   Understand these instructions.  Will watch your condition.  Will get help right away if you are not doing well or get worse. Document Released: 04/22/2011 Document Revised: 12/24/2013 Document Reviewed: 04/22/2011 [facility] Patient Information 2015 [place], [place]. This information is not intended to replace advice given to you by your health care provider. Make sure you discuss any questions you have with your health care provider.  Upper Respiratory Infection, Adult An upper  respiratory infection (URI) is also known as the common cold. It is often caused by a type of germ (virus). Colds are easily spread (contagious). You can pass it to others by kissing, coughing, sneezing, or drinking out of the same glass. Usually, you get better in 1 or 2 weeks.  HOME CARE   Only take medicine as told by your doctor.  Use a warm mist humidifier or breathe in steam from a hot shower.  Drink enough water and fluids to keep your pee (urine) clear or pale yellow.  Get plenty of rest.  Return to work when your temperature is back to normal or as told by your doctor. You may use a face mask and wash your hands to stop your cold from spreading. GET HELP RIGHT AWAY IF:   After the first few days, you feel you are getting worse.  You have questions about your medicine.  You have  chills, shortness of breath, or brown or red spit (mucus).  You have yellow or brown snot (nasal discharge) or pain in the face, especially when you bend forward.  You have a fever, puffy (swollen) neck, pain when you swallow, or white spots in the back of your throat.  You have a bad headache, ear pain, sinus pain, or chest pain.  You have a high-pitched whistling sound when you breathe in and out (wheezing).  You have a lasting cough or cough up blood.  You have sore muscles or a stiff neck. MAKE SURE YOU:   Understand these instructions.  Will watch your condition.  Will get help right away if you are not doing well or get worse. Document Released: 01/26/2008 Document Revised: 11/01/2011 Document Reviewed: 11/14/2013 [facility] Patient Information 2015 [place], [place]. This information is not intended to replace advice given to you by your health care provider. Make sure you discuss any questions you have with your health care provider.

## 2014-05-14 NOTE — ED Notes (Signed)
Patient complaining of productive cough since yesterday.

## 2014-05-16 ENCOUNTER — Emergency Department (HOSPITAL_COMMUNITY)
Admission: EM | Admit: 2014-05-16 | Discharge: 2014-05-16 | Disposition: A | Discharge status: Home / Self Care | Payer: Self-pay | Attending: Emergency Medicine | Admitting: Emergency Medicine

## 2014-05-16 ENCOUNTER — Encounter (HOSPITAL_COMMUNITY): Payer: Self-pay | Admitting: Emergency Medicine

## 2014-05-16 DIAGNOSIS — R059 Cough, unspecified: Secondary | ICD-10-CM | POA: Insufficient documentation

## 2014-05-16 DIAGNOSIS — J069 Acute upper respiratory infection, unspecified: Secondary | ICD-10-CM | POA: Insufficient documentation

## 2014-05-16 DIAGNOSIS — IMO0001 Reserved for inherently not codable concepts without codable children: Secondary | ICD-10-CM | POA: Insufficient documentation

## 2014-05-16 DIAGNOSIS — R630 Anorexia: Secondary | ICD-10-CM | POA: Insufficient documentation

## 2014-05-16 DIAGNOSIS — Z791 Long term (current) use of non-steroidal anti-inflammatories (NSAID): Secondary | ICD-10-CM | POA: Insufficient documentation

## 2014-05-16 DIAGNOSIS — Z79899 Other long term (current) drug therapy: Secondary | ICD-10-CM | POA: Insufficient documentation

## 2014-05-16 DIAGNOSIS — R51 Headache: Secondary | ICD-10-CM | POA: Insufficient documentation

## 2014-05-16 DIAGNOSIS — F17200 Nicotine dependence, unspecified, uncomplicated: Secondary | ICD-10-CM | POA: Insufficient documentation

## 2014-05-16 DIAGNOSIS — R05 Cough: Secondary | ICD-10-CM | POA: Insufficient documentation

## 2014-05-16 DIAGNOSIS — J029 Acute pharyngitis, unspecified: Secondary | ICD-10-CM | POA: Insufficient documentation

## 2014-05-16 MED ORDER — DEXAMETHASONE 4 MG PO TABS
6.0000 mg | ORAL_TABLET | Freq: Once | ORAL | Status: AC
  Administered 2014-05-16: 6 mg via ORAL
  Filled 2014-05-16: qty 2

## 2014-05-16 MED ORDER — HYDROCODONE-ACETAMINOPHEN 5-325 MG PO TABS
1.0000 | ORAL_TABLET | Freq: Once | ORAL | Status: AC
  Administered 2014-05-16: 1 via ORAL
  Filled 2014-05-16: qty 1

## 2014-05-16 MED ORDER — ALBUTEROL SULFATE HFA 108 (90 BASE) MCG/ACT IN AERS
2.0000 | INHALATION_SPRAY | RESPIRATORY_TRACT | Status: DC | PRN
  Administered 2014-05-16: 2 via RESPIRATORY_TRACT
  Filled 2014-05-16: qty 6.7

## 2014-05-16 NOTE — Discharge Instructions (Signed)
Upper Respiratory Infection, Adult An upper respiratory infection (URI) is also sometimes known as the common cold. The upper respiratory tract includes the nose, sinuses, throat, trachea, and bronchi. Bronchi are the airways leading to the lungs. Most people improve within 1 week, but symptoms can last up to 2 weeks. A residual cough may last even longer.  CAUSES Many different viruses can infect the tissues lining the upper respiratory tract. The tissues become irritated and inflamed and often become very moist. Mucus production is also common. A cold is contagious. You can easily spread the virus to others by oral contact. This includes kissing, sharing a glass, coughing, or sneezing. Touching your mouth or nose and then touching a surface, which is then touched by another person, can also spread the virus. SYMPTOMS  Symptoms typically develop 1 to 3 days after you come in contact with a cold virus. Symptoms vary from person to person. They may include:  Runny nose.  Sneezing.  Nasal congestion.  Sinus irritation.  Sore throat.  Loss of voice (laryngitis).  Cough.  Fatigue.  Muscle aches.  Loss of appetite.  Headache.  Low-grade fever. DIAGNOSIS  You might diagnose your own cold based on familiar symptoms, since most people get a cold 2 to 3 times a year. Your caregiver can confirm this based on your exam. Most importantly, your caregiver can check that your symptoms are not due to another disease such as strep throat, sinusitis, pneumonia, asthma, or epiglottitis. Blood tests, throat tests, and X-rays are not necessary to diagnose a common cold, but they may sometimes be helpful in excluding other more serious diseases. Your caregiver will decide if any further tests are required. RISKS AND COMPLICATIONS  You may be at risk for a more severe case of the common cold if you smoke cigarettes, have chronic heart disease (such as heart failure) or lung disease (such as asthma), or if  you have a weakened immune system. The very young and very old are also at risk for more serious infections. Bacterial sinusitis, middle ear infections, and bacterial pneumonia can complicate the common cold. The common cold can worsen asthma and chronic obstructive pulmonary disease (COPD). Sometimes, these complications can require emergency medical care and may be life-threatening. PREVENTION  The best way to protect against getting a cold is to practice good hygiene. Avoid oral or hand contact with people with cold symptoms. Wash your hands often if contact occurs. There is no clear evidence that vitamin C, vitamin E, echinacea, or exercise reduces the chance of developing a cold. However, it is always recommended to get plenty of rest and practice good nutrition. TREATMENT  Treatment is directed at relieving symptoms. There is no cure. Antibiotics are not effective, because the infection is caused by a virus, not by bacteria. Treatment may include:  Increased fluid intake. Sports drinks offer valuable electrolytes, sugars, and fluids.  Breathing heated mist or steam (vaporizer or shower).  Eating chicken soup or other clear broths, and maintaining good nutrition.  Getting plenty of rest.  Using gargles or lozenges for comfort.  Controlling fevers with ibuprofen or acetaminophen as directed by your caregiver.  Increasing usage of your inhaler if you have asthma. Zinc gel and zinc lozenges, taken in the first 24 hours of the common cold, can shorten the duration and lessen the severity of symptoms. Pain medicines may help with fever, muscle aches, and throat pain. A variety of non-prescription medicines are available to treat congestion and runny nose. Your caregiver   can make recommendations and may suggest nasal or lung inhalers for other symptoms.  HOME CARE INSTRUCTIONS   Only take over-the-counter or prescription medicines for pain, discomfort, or fever as directed by your  caregiver.  Use a warm mist humidifier or inhale steam from a shower to increase air moisture. This may keep secretions moist and make it easier to breathe.  Drink enough water and fluids to keep your urine clear or pale yellow.  Rest as needed.  Return to work when your temperature has returned to normal or as your caregiver advises. You may need to stay home longer to avoid infecting others. You can also use a face mask and careful hand washing to prevent spread of the virus. SEEK MEDICAL CARE IF:   After the first few days, you feel you are getting worse rather than better.  You need your caregiver's advice about medicines to control symptoms.  You develop chills, worsening shortness of breath, or brown or red sputum. These may be signs of pneumonia.  You develop yellow or brown nasal discharge or pain in the face, especially when you bend forward. These may be signs of sinusitis.  You develop a fever, swollen neck glands, pain with swallowing, or white areas in the back of your throat. These may be signs of strep throat. SEEK IMMEDIATE MEDICAL CARE IF:   You have a fever.  You develop severe or persistent headache, ear pain, sinus pain, or chest pain.  You develop wheezing, a prolonged cough, cough up blood, or have a change in your usual mucus (if you have chronic lung disease).  You develop sore muscles or a stiff neck. Document Released: 02/02/2001 Document Revised: 11/01/2011 Document Reviewed: 11/14/2013 [employer] Patient Information 2015 [employer]. This information is not intended to replace advice given to you by your health care provider. Make sure you discuss any questions you have with your health care provider.  

## 2014-05-16 NOTE — ED Provider Notes (Signed)
CSN: [id number]     Arrival date & time 05/16/14  0127 History   First MD Initiated Contact with Patient 05/16/14 0133     Chief Complaint  Patient presents with  . Cough     (Consider location/radiation/quality/duration/timing/severity/associated sxs/prior Treatment) Patient is a 23 y.o. male presenting with cough. The history is provided by the patient.  Cough Associated symptoms: headaches, myalgias, shortness of breath, sore throat and wheezing   Associated symptoms: no chest pain and no rash    patient presents with a nonproductive cough last few days. Seen in ER yesterday. Was given prescriptions, but states she's unable to afford the medicine and has not gotten better. He does ache all over. Mild shortness of breath. He does smoke. He's been using his father's inhaler.  History reviewed. No pertinent past medical history. Past Surgical History  Procedure Laterality Date  . Tympanostomy tube placement     No family history on file. History  Substance Use Topics  . Smoking status: Current Every Day Smoker  . Smokeless tobacco: Not on file  . Alcohol Use: No    Review of Systems  Constitutional: Positive for appetite change. Negative for activity change.  HENT: Positive for sore throat.   Respiratory: Positive for cough, shortness of breath and wheezing. Negative for chest tightness.   Cardiovascular: Negative for chest pain and leg swelling.  Gastrointestinal: Negative for nausea, vomiting, abdominal pain and diarrhea.  Genitourinary: Negative for flank pain.  Musculoskeletal: Positive for myalgias. Negative for back pain and neck stiffness.  Skin: Negative for rash.  Neurological: Positive for headaches. Negative for weakness.      Allergies  Review of patient's allergies indicates no known allergies.  Home Medications   Prior to Admission medications   Medication Sig Start Date End Date Taking? Authorizing Provider  antipyrine-benzocaine Lyla Son) otic  solution Place 3 drops into the right ear 4 (four) times daily as needed for pain. 02/25/13  Yes Vida Roller, MD  dexamethasone (DECADRON) 4 MG tablet 1 po bid with food 05/14/14  Yes Kathie Dike, PA-C  HYDROcodone-acetaminophen (NORCO/VICODIN) 5-325 MG per tablet Take 2 tablets by mouth every 4 (four) hours as needed for pain. 02/25/13  Yes Vida Roller, MD  HYDROcodone-acetaminophen (NORCO/VICODIN) 5-325 MG per tablet Take 2 tablets by mouth every 4 (four) hours as needed. 07/20/13  Yes Lonia Skinner Sofia, PA-C  ibuprofen (ADVIL,MOTRIN) 800 MG tablet Take 1 tablet (800 mg total) by mouth 3 (three) times daily. 07/20/13  Yes Lonia Skinner Sofia, PA-C  naproxen (NAPROSYN) 500 MG tablet Take 1 tablet (500 mg total) by mouth 2 (two) times daily with a meal. 02/25/13  Yes Vida Roller, MD  promethazine-codeine [facility] WITH CODEINE) 6.25-10 MG/5ML syrup Take 5 mLs by mouth every 6 (six) hours as needed. 05/14/14   Kathie Dike, PA-C  pseudoephedrine (SUDAFED 12 HOUR) 120 MG 12 hr tablet Take 1 tablet (120 mg total) by mouth 2 (two) times daily. 05/14/14   Kathie Dike, PA-C   BP 144/102  Pulse 85  Temp(Src) 97.8 F (36.6 C) (Oral)  Resp 20  Ht  (1.778 m)  Wt 150 lb (68.04 kg)  BMI 21.52 kg/m2  SpO2 97% Physical Exam  Constitutional: He appears well-developed and well-nourished.  Patient appears uncomfortable  HENT:  Mouth/Throat: No oropharyngeal exudate.  Eyes: Pupils are equal, round, and reactive to light.  Neck: Neck supple.  Cardiovascular: Normal rate and regular rhythm.   Pulmonary/Chest: He has wheezes.  Few scattered wheezes.  Abdominal: Soft. There is no tenderness.  Musculoskeletal: He exhibits no edema.  Neurological: He is alert.    ED Course  Procedures (including critical care time) Labs Review Labs Reviewed - No data to display  Imaging Review Dg Chest 2 View  05/14/2014   CLINICAL DATA:  Cough.  EXAM: CHEST  2 VIEW  COMPARISON:  May 08, 2007.   FINDINGS: The heart size and mediastinal contours are within normal limits. No pneumothorax or pleural effusion is noted. Mild hyperexpansion of the lungs is noted. Both lungs are clear. The visualized skeletal structures are unremarkable.  IMPRESSION: Mild hyperexpansion of the lungs. No acute cardiopulmonary abnormality seen.   Electronically Signed   By: Roque Lias M.D.   On: 05/14/2014 16:40     EKG Interpretation None      MDM   Final diagnoses:  URI (upper respiratory infection)    Patient with URI. Unable to afford medicines. Was given Decadron here which hopefully would give him a couple days of relief. Given inhaler. Will need to attempt to fill antitussives.    Juliet Rude. Rubin Payor, MD 05/16/14 [phone]

## 2014-05-16 NOTE — ED Notes (Signed)
Patient states was seen here yesterday and given prescriptions; patient states didn't have the money to pay for them and didn't get them filled.  Patient states he is not feeling any better.

## 2014-05-17 NOTE — ED Provider Notes (Signed)
Medical screening examination/treatment/procedure(s) were performed by non-physician practitioner and as supervising physician I was immediately available for consultation/collaboration.   EKG Interpretation None       Parneet Glantz, MD 05/17/14 2011 

## 2014-06-29 ENCOUNTER — Emergency Department (HOSPITAL_COMMUNITY): Payer: Self-pay

## 2014-06-29 ENCOUNTER — Encounter (HOSPITAL_COMMUNITY): Payer: Self-pay | Admitting: Emergency Medicine

## 2014-06-29 ENCOUNTER — Emergency Department (HOSPITAL_COMMUNITY)
Admission: EM | Admit: 2014-06-29 | Discharge: 2014-06-29 | Disposition: A | Discharge status: Home / Self Care | Payer: Self-pay | Attending: Emergency Medicine | Admitting: Emergency Medicine

## 2014-06-29 DIAGNOSIS — Z720 Tobacco use: Secondary | ICD-10-CM | POA: Insufficient documentation

## 2014-06-29 DIAGNOSIS — Z791 Long term (current) use of non-steroidal anti-inflammatories (NSAID): Secondary | ICD-10-CM | POA: Insufficient documentation

## 2014-06-29 DIAGNOSIS — S31114A Laceration without foreign body of abdominal wall, left lower quadrant without penetration into peritoneal cavity, initial encounter: Secondary | ICD-10-CM | POA: Insufficient documentation

## 2014-06-29 DIAGNOSIS — S31119A Laceration without foreign body of abdominal wall, unspecified quadrant without penetration into peritoneal cavity, initial encounter: Secondary | ICD-10-CM

## 2014-06-29 DIAGNOSIS — Z7952 Long term (current) use of systemic steroids: Secondary | ICD-10-CM | POA: Insufficient documentation

## 2014-06-29 DIAGNOSIS — S79912A Unspecified injury of left hip, initial encounter: Secondary | ICD-10-CM

## 2014-06-29 DIAGNOSIS — Z79899 Other long term (current) drug therapy: Secondary | ICD-10-CM | POA: Insufficient documentation

## 2014-06-29 LAB — CBC WITH DIFFERENTIAL/PLATELET
BASOS PCT: 0 % (ref 0–1)
Basophils Absolute: 0 10*3/uL (ref 0.0–0.1)
EOS ABS: 0 10*3/uL (ref 0.0–0.7)
Eosinophils Relative: 0 % (ref 0–5)
HCT: 48 % (ref 39.0–52.0)
Hemoglobin: 17.3 g/dL — ABNORMAL HIGH (ref 13.0–17.0)
Lymphocytes Relative: 22 % (ref 12–46)
Lymphs Abs: 2.7 10*3/uL (ref 0.7–4.0)
MCH: 30.7 pg (ref 26.0–34.0)
MCHC: 36 g/dL (ref 30.0–36.0)
MCV: 85.1 fL (ref 78.0–100.0)
Monocytes Absolute: 0.8 10*3/uL (ref 0.1–1.0)
Monocytes Relative: 6 % (ref 3–12)
NEUTROS PCT: 72 % (ref 43–77)
Neutro Abs: 9 10*3/uL — ABNORMAL HIGH (ref 1.7–7.7)
PLATELETS: 336 10*3/uL (ref 150–400)
RBC: 5.64 MIL/uL (ref 4.22–5.81)
RDW: 12.1 % (ref 11.5–15.5)
WBC: 12.5 10*3/uL — ABNORMAL HIGH (ref 4.0–10.5)

## 2014-06-29 LAB — URINALYSIS, ROUTINE W REFLEX MICROSCOPIC
Bilirubin Urine: NEGATIVE
Glucose, UA: NEGATIVE mg/dL
Ketones, ur: NEGATIVE mg/dL
LEUKOCYTES UA: NEGATIVE
Nitrite: NEGATIVE
PROTEIN: 100 mg/dL — AB
Specific Gravity, Urine: 1.015 (ref 1.005–1.030)
UROBILINOGEN UA: 0.2 mg/dL (ref 0.0–1.0)
pH: 8 (ref 5.0–8.0)

## 2014-06-29 LAB — BASIC METABOLIC PANEL
Anion gap: 30 — ABNORMAL HIGH (ref 5–15)
BUN: 14 mg/dL (ref 6–23)
CO2: 15 mEq/L — ABNORMAL LOW (ref 19–32)
Calcium: 10.3 mg/dL (ref 8.4–10.5)
Chloride: 91 mEq/L — ABNORMAL LOW (ref 96–112)
Creatinine, Ser: 1.27 mg/dL (ref 0.50–1.35)
GFR, EST NON AFRICAN AMERICAN: 79 mL/min — AB (ref >90)
Glucose, Bld: 212 mg/dL — ABNORMAL HIGH (ref 70–99)
POTASSIUM: 3.3 meq/L — AB (ref 3.7–5.3)
SODIUM: 136 meq/L — AB (ref 137–147)

## 2014-06-29 LAB — URINE MICROSCOPIC-ADD ON
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
## 2014-06-29 MED ORDER — NAPROXEN SODIUM 550 MG PO TABS: 550.0000 mg | ORAL_TABLET | Freq: Two times a day (BID) | ORAL | Status: DC

## 2014-06-29 MED ORDER — FENTANYL CITRATE 0.05 MG/ML IJ SOLN: 100.0000 ug | Freq: Once | INTRAMUSCULAR | Status: DC

## 2014-06-29 MED ORDER — SODIUM CHLORIDE 0.9 % IV SOLN
INTRAVENOUS | Status: DC
  Administered 2014-06-29 (×2): via INTRAVENOUS

## 2014-06-29 MED ORDER — CEFAZOLIN SODIUM 1-5 GM-% IV SOLN
1.0000 g | Freq: Once | INTRAVENOUS | Status: AC
  Administered 2014-06-29: 1 g via INTRAVENOUS
  Filled 2014-06-29: qty 50

## 2014-06-29 MED ORDER — FENTANYL CITRATE 0.05 MG/ML IJ SOLN
100.0000 ug | Freq: Once | INTRAMUSCULAR | Status: AC
  Administered 2014-06-29: 100 ug via INTRAVENOUS

## 2014-06-29 MED ORDER — CEPHALEXIN 500 MG PO CAPS: 500.0000 mg | ORAL_CAPSULE | Freq: Four times a day (QID) | ORAL | Status: DC

## 2014-06-29 MED ORDER — FENTANYL CITRATE 0.05 MG/ML IJ SOLN
100.0000 ug | Freq: Once | INTRAMUSCULAR | Status: AC
  Administered 2014-06-29: 100 ug via INTRAVENOUS
  Filled 2014-06-29: qty 2

## 2014-06-29 MED ORDER — FENTANYL CITRATE 0.05 MG/ML IJ SOLN
INTRAMUSCULAR | Status: AC
  Administered 2014-06-29: 100 ug via INTRAVENOUS
  Filled 2014-06-29: qty 2

## 2014-06-29 MED ORDER — IOHEXOL 300 MG/ML  SOLN
100.0000 mL | Freq: Once | INTRAMUSCULAR | Status: AC | PRN
  Administered 2014-06-29: 100 mL via INTRAVENOUS

## 2014-06-29 MED ORDER — LIDOCAINE-EPINEPHRINE (PF) 1 %-1:200000 IJ SOLN
INTRAMUSCULAR | Status: AC
  Administered 2014-06-29: 06:00:00
  Filled 2014-06-29: qty 10

## 2014-06-29 MED ORDER — LIDOCAINE-EPINEPHRINE (PF) 1 %-1:200000 IJ SOLN
INTRAMUSCULAR | Status: AC
  Administered 2014-06-29: 05:00:00
  Filled 2014-06-29: qty 10

## 2014-06-29 MED ORDER — POVIDONE-IODINE 10 % EX SOLN
CUTANEOUS | Status: AC
  Administered 2014-06-29: 06:00:00
  Filled 2014-06-29: qty 118

## 2014-06-29 NOTE — ED Notes (Signed)
Patients belonging collected and sealed in paper bags at this time
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
## 2014-06-29 NOTE — ED Notes (Signed)
Discharge instructions given, pt demonstrated teach back and verbal understanding. No concerns voiced.  
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
## 2014-06-29 NOTE — ED Notes (Signed)
Wound covered with non adherent dressing and abd pad, secured with surgical tape.
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
## 2014-11-06 ENCOUNTER — Encounter (HOSPITAL_BASED_OUTPATIENT_CLINIC_OR_DEPARTMENT_OTHER): Payer: Self-pay

## 2014-11-06 ENCOUNTER — Emergency Department (HOSPITAL_BASED_OUTPATIENT_CLINIC_OR_DEPARTMENT_OTHER)
Admission: EM | Admit: 2014-11-06 | Discharge: 2014-11-06 | Disposition: A | Discharge status: Home / Self Care | Payer: Self-pay | Attending: Emergency Medicine | Admitting: Emergency Medicine

## 2014-11-06 ENCOUNTER — Emergency Department (HOSPITAL_BASED_OUTPATIENT_CLINIC_OR_DEPARTMENT_OTHER): Payer: Self-pay

## 2014-11-06 DIAGNOSIS — S60221A Contusion of right hand, initial encounter: Secondary | ICD-10-CM | POA: Insufficient documentation

## 2014-11-06 DIAGNOSIS — Y998 Other external cause status: Secondary | ICD-10-CM | POA: Insufficient documentation

## 2014-11-06 DIAGNOSIS — Z720 Tobacco use: Secondary | ICD-10-CM | POA: Insufficient documentation

## 2014-11-06 DIAGNOSIS — Z791 Long term (current) use of non-steroidal anti-inflammatories (NSAID): Secondary | ICD-10-CM | POA: Insufficient documentation

## 2014-11-06 DIAGNOSIS — Y9389 Activity, other specified: Secondary | ICD-10-CM | POA: Insufficient documentation

## 2014-11-06 DIAGNOSIS — Z792 Long term (current) use of antibiotics: Secondary | ICD-10-CM | POA: Insufficient documentation

## 2014-11-06 DIAGNOSIS — Y9289 Other specified places as the place of occurrence of the external cause: Secondary | ICD-10-CM | POA: Insufficient documentation

## 2014-11-06 DIAGNOSIS — W293XXA Contact with powered garden and outdoor hand tools and machinery, initial encounter: Secondary | ICD-10-CM | POA: Insufficient documentation
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
## 2014-11-06 MED ORDER — IBUPROFEN 800 MG PO TABS: 800.0000 mg | ORAL_TABLET | Freq: Three times a day (TID) | ORAL | Status: DC

## 2014-11-06 MED ORDER — IBUPROFEN 800 MG PO TABS
800.0000 mg | ORAL_TABLET | Freq: Once | ORAL | Status: AC
  Administered 2014-11-06: 800 mg via ORAL
  Filled 2014-11-06: qty 1

## 2014-11-06 NOTE — Discharge Instructions (Signed)
Take ibuprofen as directed for pain. Rest, ice and elevate your hands.  Hand Contusion A hand contusion is a deep bruise on your hand area. Contusions are the result of an injury that caused bleeding under the skin. The contusion may turn blue, purple, or yellow. Minor injuries will give you a painless contusion, but more severe contusions may stay painful and swollen for a few weeks. CAUSES  A contusion is usually caused by a blow, trauma, or direct force to an area of the body. SYMPTOMS   Swelling and redness of the injured area.  Discoloration of the injured area.  Tenderness and soreness of the injured area.  Pain. DIAGNOSIS  The diagnosis can be made by taking a history and performing a physical exam. An X-ray, CT scan, or MRI may be needed to determine if there were any associated injuries, such as broken bones (fractures). TREATMENT  Often, the best treatment for a hand contusion is resting, elevating, icing, and applying cold compresses to the injured area. Over-the-counter medicines may also be recommended for pain control. HOME CARE INSTRUCTIONS   Put ice on the injured area.  Put ice in a plastic bag.  Place a towel between your skin and the bag.  Leave the ice on for 15-20 minutes, 03-04 times a day.  Only take over-the-counter or prescription medicines as directed by your caregiver. Your caregiver may recommend avoiding anti-inflammatory medicines (aspirin, ibuprofen, and naproxen) for 48 hours because these medicines may increase bruising.  If told, use an elastic wrap as directed. This can help reduce swelling. You may remove the wrap for sleeping, showering, and bathing. If your fingers become numb, cold, or blue, take the wrap off and reapply it more loosely.  Elevate your hand with pillows to reduce swelling.  Avoid overusing your hand if it is painful. SEEK IMMEDIATE MEDICAL CARE IF:   You have increased redness, swelling, or pain in your hand.  Your  swelling or pain is not relieved with medicines.  You have loss of feeling in your hand or are unable to move your fingers.  Your hand turns cold or blue.  You have pain when you move your fingers.  Your hand becomes warm to the touch.  Your contusion does not improve in 2 days. MAKE SURE YOU:   Understand these instructions.  Will watch your condition.  Will get help right away if you are not doing well or get worse. Document Released: 01/29/2002 Document Revised: 05/03/2012 Document Reviewed: 01/31/2012 [facility] Patient Information 2015 [place], [place]. This information is not intended to replace advice given to you by your health care provider. Make sure you discuss any questions you have with your health care provider. RICE: Routine Care for Injuries The routine care of many injuries includes Rest, Ice, Compression, and Elevation (RICE). HOME CARE INSTRUCTIONS  Rest is needed to allow your body to heal. Routine activities can usually be resumed when comfortable. Injured tendons and bones can take up to 6 weeks to heal. Tendons are the cord-like structures that attach muscle to bone.  Ice following an injury helps keep the swelling down and reduces pain.  Put ice in a plastic bag.  Place a towel between your skin and the bag.  Leave the ice on for 15-20 minutes, 3-4 times a day, or as directed by your health care provider. Do this while awake, for the first 24 to 48 hours. After that, continue as directed by your caregiver.  Compression helps keep swelling down. It  also gives support and helps with discomfort. If an elastic bandage has been applied, it should be removed and reapplied every 3 to 4 hours. It should not be applied tightly, but firmly enough to keep swelling down. Watch fingers or toes for swelling, bluish discoloration, coldness, numbness, or excessive pain. If any of these problems occur, remove the bandage and reapply loosely. Contact your caregiver if these  problems continue.  Elevation helps reduce swelling and decreases pain. With extremities, such as the arms, hands, legs, and feet, the injured area should be placed near or above the level of the heart, if possible. SEEK IMMEDIATE MEDICAL CARE IF:  You have persistent pain and swelling.  You develop redness, numbness, or unexpected weakness.  Your symptoms are getting worse rather than improving after several days. These symptoms may indicate that further evaluation or further X-rays are needed. Sometimes, X-rays may not show a small broken bone (fracture) until 1 week or 10 days later. Make a follow-up appointment with your caregiver. Ask when your X-ray results will be ready. Make sure you get your X-ray results. Document Released: 11/21/2000 Document Revised: 08/14/2013 Document Reviewed: 01/08/2011 [facility] Patient Information 2015 [place] [employer]. This information is not intended to replace advice given to you by your health care provider. Make sure you discuss any questions you have with your health care provider.

## 2014-11-06 NOTE — ED Notes (Signed)
Patient transported to x-ray. ?
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
## 2014-11-06 NOTE — ED Notes (Signed)
Patient transported to X-ray 

## 2014-11-06 NOTE — ED Notes (Signed)
Pt reports a tree trunk rolled onto his right hand today while he was using a chainsaw for trimming. Now c/o right hand pain.

## 2015-10-13 ENCOUNTER — Emergency Department (HOSPITAL_COMMUNITY)
Admission: EM | Admit: 2015-10-13 | Discharge: 2015-10-13 | Discharge status: Against Medical Advice | Payer: Self-pay | Attending: Emergency Medicine | Admitting: Emergency Medicine

## 2015-10-13 ENCOUNTER — Encounter (HOSPITAL_COMMUNITY): Payer: Self-pay

## 2015-10-13 ENCOUNTER — Emergency Department (HOSPITAL_COMMUNITY): Payer: Self-pay

## 2015-10-13 DIAGNOSIS — Y9289 Other specified places as the place of occurrence of the external cause: Secondary | ICD-10-CM | POA: Insufficient documentation

## 2015-10-13 DIAGNOSIS — Y9389 Activity, other specified: Secondary | ICD-10-CM | POA: Insufficient documentation

## 2015-10-13 DIAGNOSIS — T401X1A Poisoning by heroin, accidental (unintentional), initial encounter: Secondary | ICD-10-CM | POA: Insufficient documentation

## 2015-10-13 DIAGNOSIS — F17200 Nicotine dependence, unspecified, uncomplicated: Secondary | ICD-10-CM | POA: Insufficient documentation

## 2015-10-13 DIAGNOSIS — R4182 Altered mental status, unspecified: Secondary | ICD-10-CM | POA: Insufficient documentation

## 2015-10-13 DIAGNOSIS — Y998 Other external cause status: Secondary | ICD-10-CM | POA: Insufficient documentation

## 2015-10-13 LAB — COMPREHENSIVE METABOLIC PANEL
ALBUMIN: 4.8 g/dL (ref 3.5–5.0)
ALK PHOS: 65 U/L (ref 38–126)
ALT: 29 U/L (ref 17–63)
AST: 24 U/L (ref 15–41)
Anion gap: 10 (ref 5–15)
BUN: 11 mg/dL (ref 6–20)
CO2: 26 mmol/L (ref 22–32)
Calcium: 9.5 mg/dL (ref 8.9–10.3)
Chloride: 103 mmol/L (ref 101–111)
Creatinine, Ser: 1.01 mg/dL (ref 0.61–1.24)
GFR calc Af Amer: >60 mL/min (ref >60)
GFR calc non Af Amer: >60 mL/min (ref >60)
GLUCOSE: 123 mg/dL — AB (ref 65–99)
POTASSIUM: 4.5 mmol/L (ref 3.5–5.1)
Sodium: 139 mmol/L (ref 135–145)
Total Bilirubin: 0.6 mg/dL (ref 0.3–1.2)
Total Protein: 7.6 g/dL (ref 6.5–8.1)

## 2015-10-13 LAB — CBC WITH DIFFERENTIAL/PLATELET
BASOS ABS: 0 10*3/uL (ref 0.0–0.1)
BASOS PCT: 0 %
Eosinophils Absolute: 0.1 10*3/uL (ref 0.0–0.7)
Eosinophils Relative: 0 %
HEMATOCRIT: 47.1 % (ref 39.0–52.0)
HEMOGLOBIN: 15.1 g/dL (ref 13.0–17.0)
Lymphocytes Relative: 9 %
Lymphs Abs: 1.1 10*3/uL (ref 0.7–4.0)
MCH: 28.4 pg (ref 26.0–34.0)
MCHC: 32.1 g/dL (ref 30.0–36.0)
MCV: 88.7 fL (ref 78.0–100.0)
Monocytes Absolute: 1 10*3/uL (ref 0.1–1.0)
Monocytes Relative: 8 %
NEUTROS ABS: 10.4 10*3/uL — AB (ref 1.7–7.7)
NEUTROS PCT: 83 %
Platelets: 265 10*3/uL (ref 150–400)
RBC: 5.31 MIL/uL (ref 4.22–5.81)
RDW: 13.1 % (ref 11.5–15.5)
WBC: 12.5 10*3/uL — ABNORMAL HIGH (ref 4.0–10.5)

## 2015-10-13 IMAGING — CT CT HEAD W/O CM
2 series · 17 of 30 positions shown, 20 images · non-contrast
Comparison: None.

CLINICAL DATA: 24-year-old with heroin overdose. Patient given
Narcan prior to arrival to the emergency department. [place]

EXAM:
CT HEAD WITHOUT CONTRAST
TECHNIQUE: Contiguous axial images were obtained from the base of the skull
through the vertex without intravenous contrast.

[Series 2: head w/o · axial · non-contrast · 0.41mm/px · z∈[-117,+3]mm · 9 of 30 slices shown, 12 images]
[im 3/30  brain]
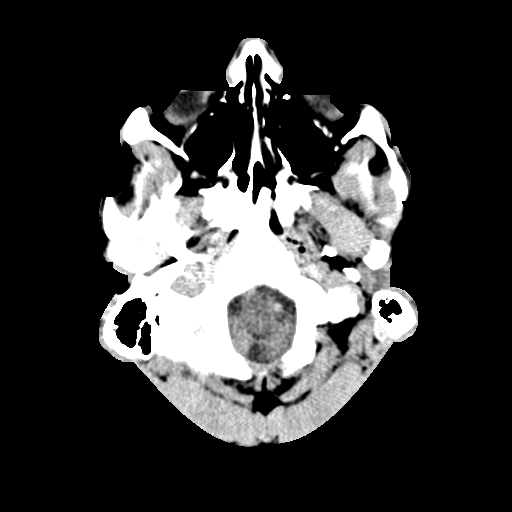
[im 3/30  bone]
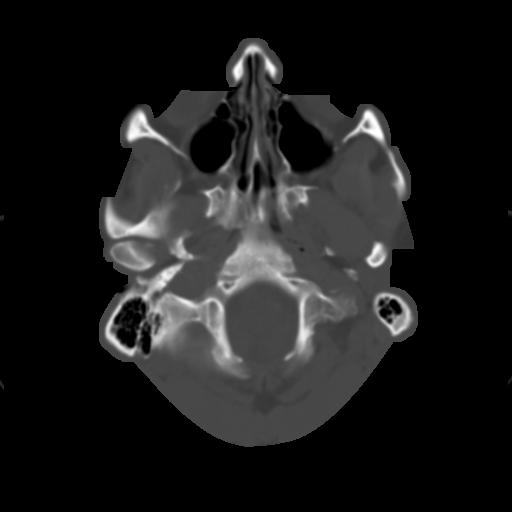
[im 6/30  brain]
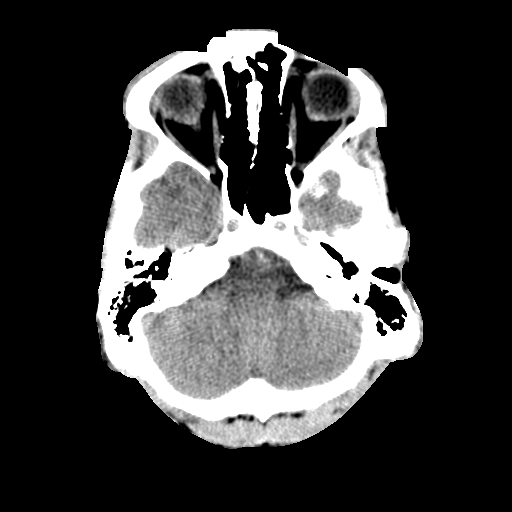
[im 9/30  brain]
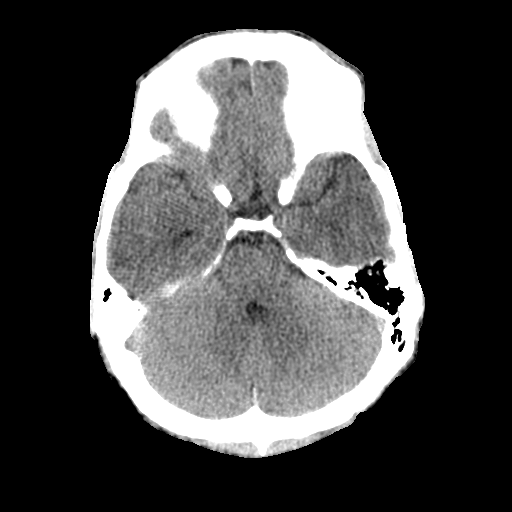
[im 12/30  brain]
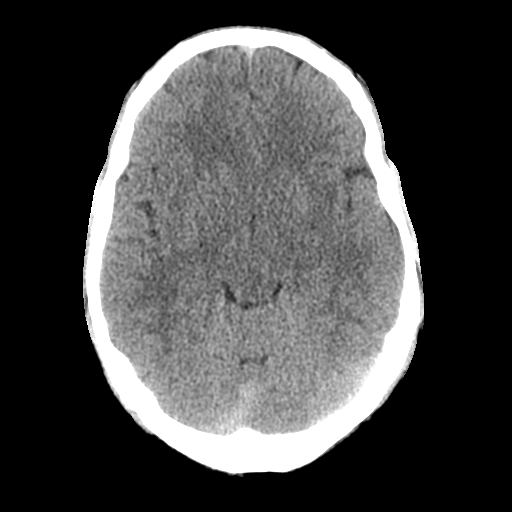
[im 15/30  brain]
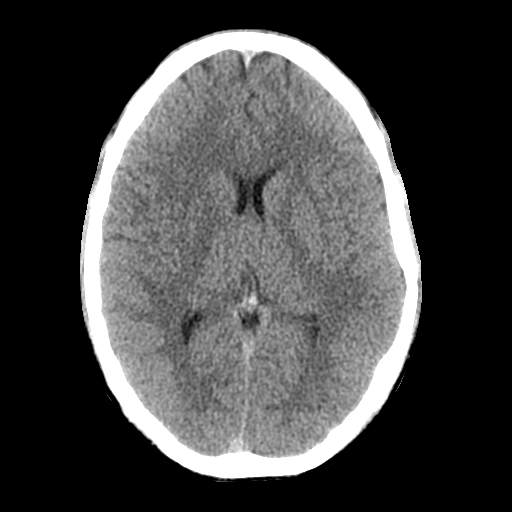
[im 15/30  bone]
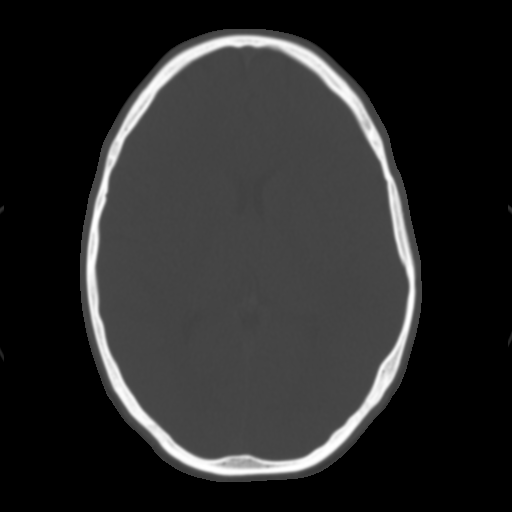
[im 18/30  brain]
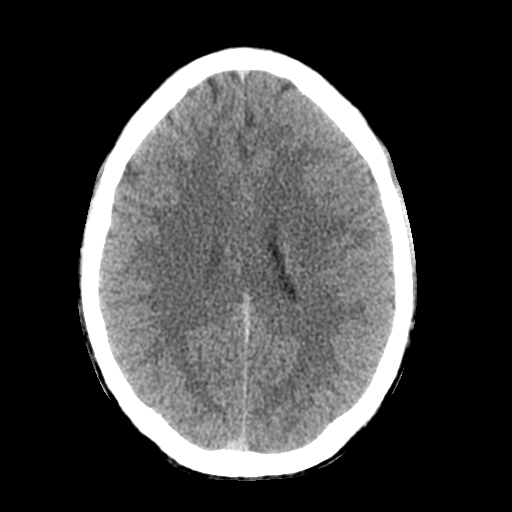
[im 21/30  brain]
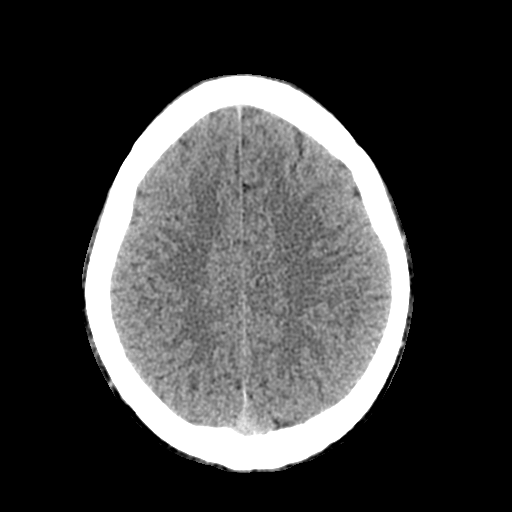
[im 24/30  brain]
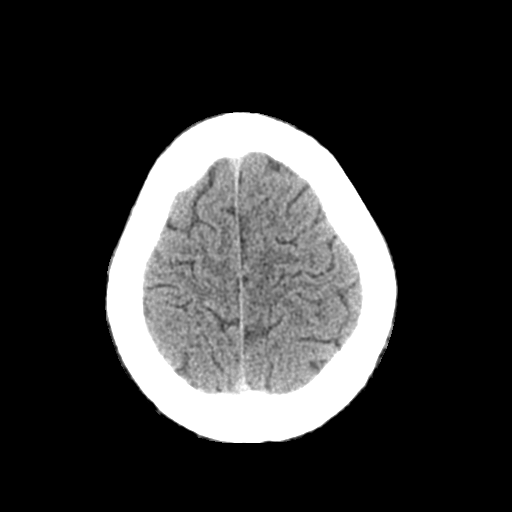
[im 27/30  brain]
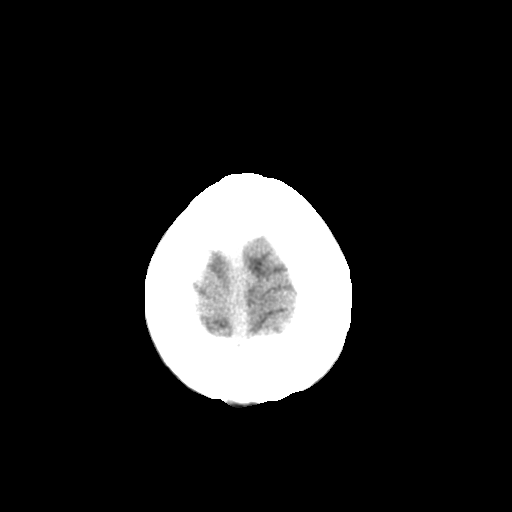
[im 27/30  bone]
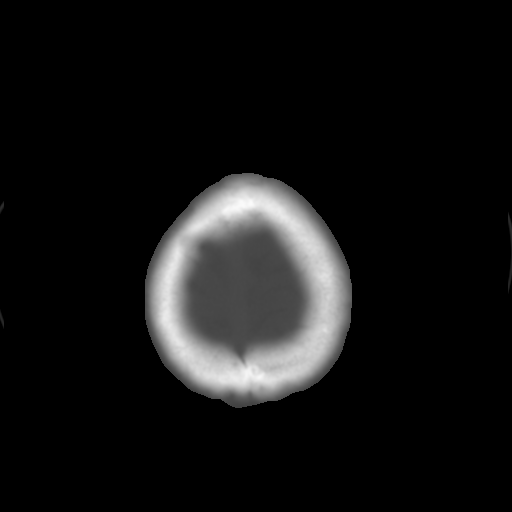

[Series 3: bone windows · axial · 0.41mm/px · z∈[-112,+2]mm · 8 of 50 slices shown]
[im 6/50  bone]
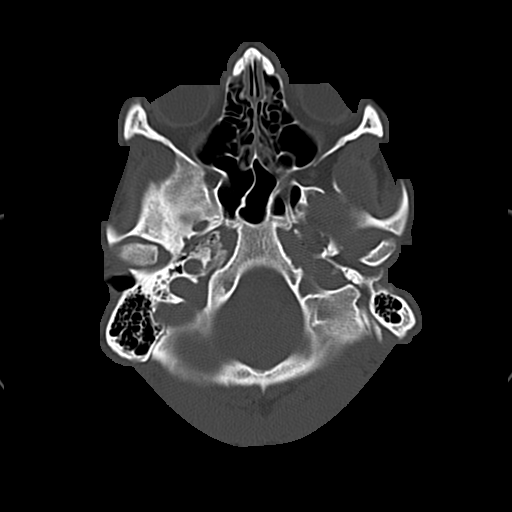
[im 11/50  bone]
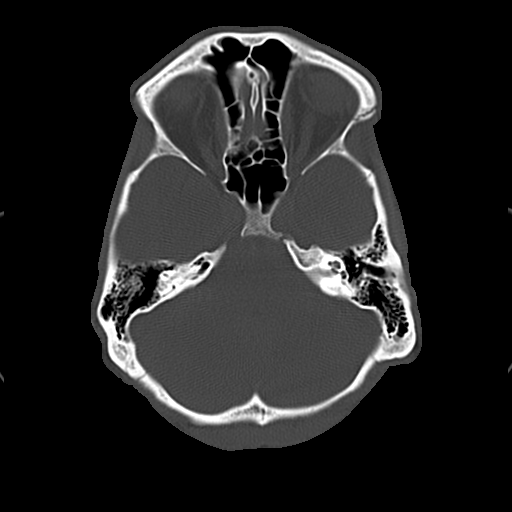
[im 17/50  bone]
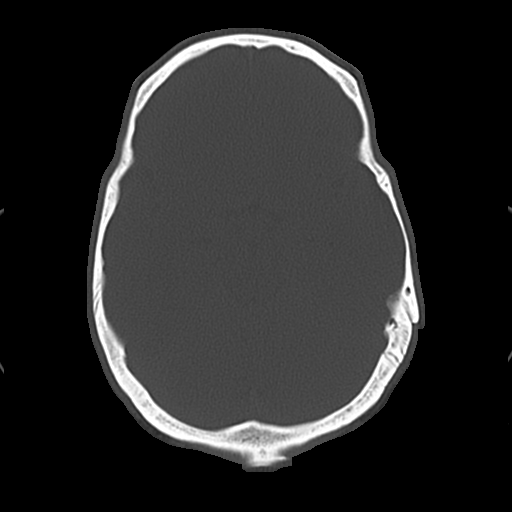
[im 22/50  bone]
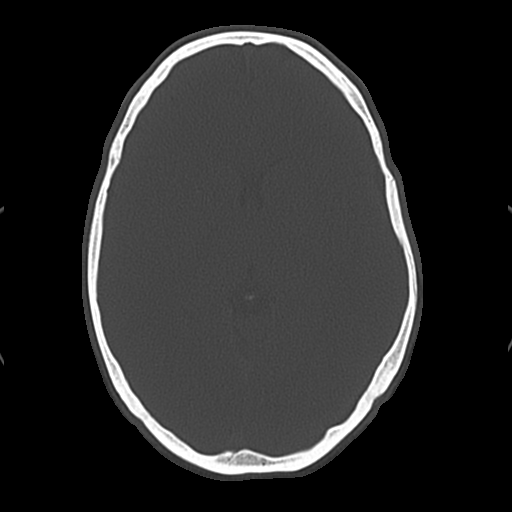
[im 28/50  bone]
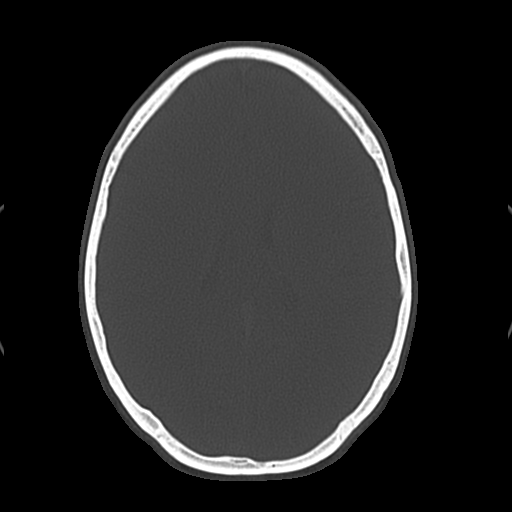
[im 33/50  bone]
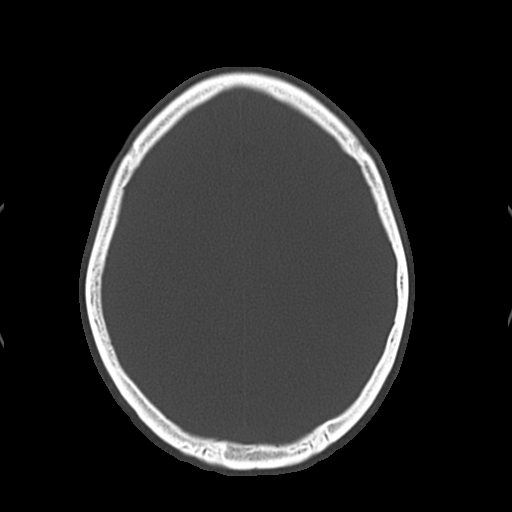
[im 39/50  bone]
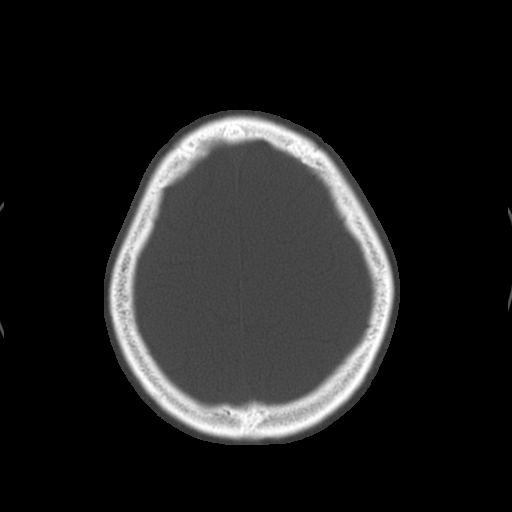
[im 44/50  bone]
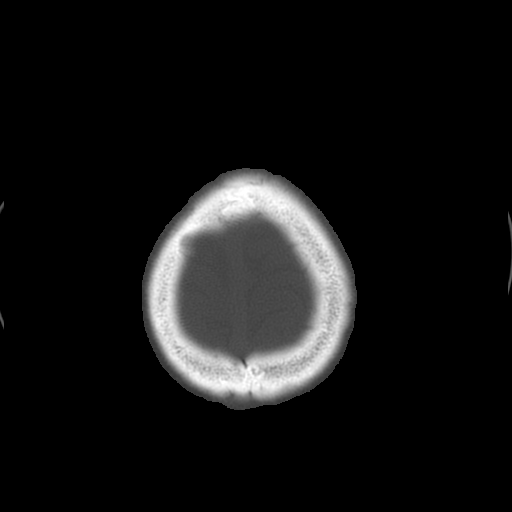

[17 of 30 positions shown; findings below may reference images not displayed]

FINDINGS: Ventricular system normal in size and appearance for age. No mass
lesion. No midline shift. No acute hemorrhage or hematoma. No
extra-axial fluid collections. No evidence of acute infarction. No
focal brain parenchymal abnormalities.

No focal osseous abnormalities involving the skull. Visualized
paranasal sinuses, bilateral mastoid air cells, and bilateral middle
ear cavities well-aerated.
IMPRESSION: Normal examination.

## 2015-10-13 MED ORDER — NALOXONE HCL 2 MG/2ML IJ SOSY
2.0000 mg | PREFILLED_SYRINGE | Freq: Once | INTRAMUSCULAR | Status: AC
  Administered 2015-10-13: 2 mg via INTRAVENOUS
  Filled 2015-10-13: qty 2

## 2015-10-13 MED ORDER — NALOXONE HCL 2 MG/2ML IJ SOSY
1.0000 mg | PREFILLED_SYRINGE | Freq: Once | INTRAMUSCULAR | Status: AC
  Administered 2015-10-13: 1 mg via INTRAVENOUS
  Filled 2015-10-13: qty 2

## 2015-10-13 MED ORDER — ONDANSETRON HCL 4 MG/2ML IJ SOLN
4.0000 mg | Freq: Once | INTRAMUSCULAR | Status: AC
  Administered 2015-10-13: 4 mg via INTRAVENOUS
  Filled 2015-10-13: qty 2

## 2015-10-13 NOTE — ED Notes (Signed)
Pt gave this RN permission to discuss care w/ his mother.    After speaking w/ his mother, Pt decided to leave AMA.
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
## 2015-10-13 NOTE — ED Notes (Signed)
Patient pulled IV out and attempting to leave. EDP notified that the patient was trying to leave. EDP in the room with the patient.

## 2015-10-13 NOTE — ED Notes (Addendum)
Pt removed IV and threatening to leave.  [place] MD made aware.

## 2015-10-13 NOTE — ED Notes (Signed)
Patient transported to CT 

## 2015-10-13 NOTE — ED Provider Notes (Signed)
CSN: [id number]     Arrival date & time 10/13/15  1548 History   First MD Initiated Contact with Patient 10/13/15 1555     Chief Complaint  Patient presents with  . Heroin Overdose      (Consider location/radiation/quality/duration/timing/severity/associated sxs/prior Treatment) Patient is a 25 y.o. male presenting with Overdose. The history is provided by the EMS personnel (The patient was found unresponsive by EMS. He had been using heroin. A bystander gave the patient Narcan and he awoke to where he was confused).  Drug Overdose This is a recurrent problem. The current episode started less than 1 hour ago. The problem occurs constantly. The problem has been gradually improving. Pertinent negatives include no chest pain and no abdominal pain. Nothing aggravates the symptoms. Nothing relieves the symptoms.    History reviewed. No pertinent past medical history. Past Surgical History  Procedure Laterality Date  . Tympanostomy tube placement     History reviewed. No pertinent family history. Social History  Substance Use Topics  . Smoking status: Current Every Day Smoker  . Smokeless tobacco: None  . Alcohol Use: Yes     Comment: weekly    Review of Systems  Unable to perform ROS: Mental status change  Cardiovascular: Negative for chest pain.  Gastrointestinal: Negative for abdominal pain.      Allergies  Review of patient's allergies indicates no known allergies.  Home Medications   Prior to Admission medications   Medication Sig Start Date End Date Taking? Authorizing Provider  acetaminophen (TYLENOL) 500 MG tablet Take 1,000 mg by mouth every 6 (six) hours as needed for moderate pain or headache.   Yes Historical Provider, MD  ibuprofen (ADVIL,MOTRIN) 200 MG tablet Take 800 mg by mouth every 6 (six) hours as needed for headache or moderate pain.   Yes Historical Provider, MD   BP 94/54 mmHg  Pulse 74  Temp(Src) 97.8 F (36.6 C) (Oral)  Resp 14  SpO2 96% Physical  Exam  Constitutional: He appears well-developed.  HENT:  Head: Normocephalic.  Eyes: Conjunctivae and EOM are normal. No scleral icterus.  Neck: Neck supple. No thyromegaly present.  Cardiovascular: Normal rate and regular rhythm.  Exam reveals no gallop and no friction rub.   No murmur heard. Pulmonary/Chest: No stridor. He has no wheezes. He has no rales. He exhibits no tenderness.  Abdominal: He exhibits no distension. There is no tenderness. There is no rebound.  Musculoskeletal: Normal range of motion. He exhibits no edema.  Lymphadenopathy:    He has no cervical adenopathy.  Neurological: He exhibits normal muscle tone. Coordination normal.  Patient lethargic will not answer any questions.  Skin: No rash noted. No erythema.  Psychiatric: He has a normal mood and affect. His behavior is normal.    ED Course  Procedures (including critical care time) Labs Review Labs Reviewed  CBC WITH DIFFERENTIAL/PLATELET - Abnormal; Notable for the following:    WBC 12.5 (*)    Neutro Abs 10.4 (*)    All other components within normal limits  COMPREHENSIVE METABOLIC PANEL - Abnormal; Notable for the following:    Glucose, Bld 123 (*)    All other components within normal limits  URINE RAPID DRUG SCREEN, HOSP PERFORMED    Imaging Review Ct Head Wo Contrast  10/13/2015  CLINICAL DATA:  25 year old with heroin overdose. Patient given Narcan prior to arrival to the emergency department. Patient was found in a parking lot in [place], [place]. EXAM: CT HEAD WITHOUT CONTRAST TECHNIQUE: Contiguous axial  images were obtained from the base of the skull through the vertex without intravenous contrast. COMPARISON:  None. FINDINGS: Ventricular system normal in size and appearance for age. No mass lesion. No midline shift. No acute hemorrhage or hematoma. No extra-axial fluid collections. No evidence of acute infarction. No focal brain parenchymal abnormalities. No focal osseous abnormalities  involving the skull. Visualized paranasal sinuses, bilateral mastoid air cells, and bilateral middle ear cavities well-aerated. IMPRESSION: Normal examination. Electronically Signed   By: Hulan Saas M.D.   On: 10/13/2015 17:43   I have personally reviewed and evaluated these images and lab results as part of my medical decision-making.   EKG Interpretation None     CRITICAL CARE Performed by: Aarika Moon,Darrly L Total critical care time: 45 minutes Critical care time was exclusive of separately billable procedures and treating other patients. Critical care was necessary to treat or prevent imminent or life-threatening deterioration. Critical care was time spent personally by me on the following activities: development of treatment plan with patient and/or surrogate as well as nursing, discussions with consultants, evaluation of patient's response to treatment, examination of patient, obtaining history from patient or surrogate, ordering and performing treatments and interventions, ordering and review of laboratory studies, ordering and review of radiographic studies, pulse oximetry and re-evaluation of patient's condition.   MDM   Final diagnoses:  Accidental heroin overdose, initial encounter    Patient needed 3 different dosages of Narcan while he was in the emergency department. After the third dose he was awake and wanted to leave AMA. I explained that the patient may have a respiratory arrest if he does not take around longer. But he decided to leave    Bethann Berkshire, MD 10/13/15 [phone]

## 2015-10-13 NOTE — ED Notes (Signed)
[employer] at the bedside 

## 2015-10-13 NOTE — ED Notes (Signed)
Info Pt urine would need to be collected

## 2018-03-28 ENCOUNTER — Emergency Department (HOSPITAL_COMMUNITY)
Admission: EM | Admit: 2018-03-28 | Discharge: 2018-03-28 | Disposition: A | Discharge status: Home / Self Care | Payer: Self-pay | Attending: Emergency Medicine | Admitting: Emergency Medicine

## 2018-03-28 ENCOUNTER — Encounter (HOSPITAL_COMMUNITY): Payer: Self-pay | Admitting: Emergency Medicine

## 2018-03-28 ENCOUNTER — Other Ambulatory Visit: Payer: Self-pay

## 2018-03-28 DIAGNOSIS — M545 Low back pain, unspecified: Secondary | ICD-10-CM

## 2018-03-28 DIAGNOSIS — F17200 Nicotine dependence, unspecified, uncomplicated: Secondary | ICD-10-CM | POA: Insufficient documentation

## 2018-03-28 HISTORY — DX: Dorsalgia, unspecified: M54.9

## 2018-03-28 MED ORDER — CYCLOBENZAPRINE HCL 10 MG PO TABS: 10.0000 mg | ORAL_TABLET | Freq: Three times a day (TID) | ORAL | 0 refills | Status: DC | PRN

## 2018-03-28 MED ORDER — PREDNISONE 10 MG PO TABS: ORAL_TABLET | ORAL | 0 refills | Status: DC

## 2018-03-28 MED ORDER — TRAMADOL HCL 50 MG PO TABS: 50.0000 mg | ORAL_TABLET | Freq: Four times a day (QID) | ORAL | 0 refills | Status: DC | PRN

## 2018-03-28 NOTE — Discharge Instructions (Addendum)
Apply ice packs on and off to your back.  Avoid bending or twisting movements for 1 week.  Follow-up with your primary doctor or return to the ER for any worsening symptoms

## 2018-03-28 NOTE — ED Triage Notes (Signed)
Patient complaining of lower back pain since yesterday. Denies injury.

## 2018-03-28 NOTE — ED Notes (Signed)
Pt with lower back pain that started recently, states that he messed up his back while weight lifting a year ago,  States pain radiates to right buttock.

## 2018-03-30 NOTE — ED Provider Notes (Signed)
[facility] EMERGENCY DEPARTMENT Provider Note   CSN: [id number] Arrival date & time: 03/28/18  1204     History   Chief Complaint Chief Complaint  Patient presents with  . Back Pain    HPI [name] is a 27 y.o. male.  HPI  [name] is a 27 y.o. male who presents to the Emergency Department complaining of low back pain for 1 day.  States that he does a lot of bending and lifting at his job, but he is unsure of known injury.  No fall.  He describes the pain as aching and associated with movement.  Pain improves at rest.  He denies abdominal pain, pain numbness or weakness of the lower extremities, fever, chills, urine or bowel changes.  He has tried over-the-counter medications without relief.  He admits to a previous history of low back pain that has been intermittent.  Current pain feels somewhat similar.    Past Medical History:  Diagnosis Date  . Back pain     There are no active problems to display for this patient.   Past Surgical History:  Procedure Laterality Date  . TYMPANOSTOMY TUBE PLACEMENT          Home Medications    Prior to Admission medications   Medication Sig Start Date End Date Taking? Authorizing Provider  acetaminophen (TYLENOL) 500 MG tablet Take 1,000 mg by mouth every 6 (six) hours as needed for moderate pain or headache.    [provider]  cyclobenzaprine (FLEXERIL) 10 MG tablet Take 1 tablet (10 mg total) by mouth 3 (three) times daily as needed. 03/28/18   Valecia Beske, PA-C  ibuprofen (ADVIL,MOTRIN) 200 MG tablet Take 800 mg by mouth every 6 (six) hours as needed for headache or moderate pain.    [provider]  predniSONE (DELTASONE) 10 MG tablet Take 6 tablets day one, 5 tablets day two, 4 tablets day three, 3 tablets day four, 2 tablets day five, then 1 tablet day six 03/28/18   Leslea Vowles, PA-C  traMADol (ULTRAM) 50 MG tablet Take 1 tablet (50 mg total) by mouth every 6 (six) hours as needed.  03/28/18   Pauline Aus, PA-C    Family History History reviewed. No pertinent family history.  Social History Social History   Tobacco Use  . Smoking status: Current Every Day Smoker  . Smokeless tobacco: Never Used  Substance Use Topics  . Alcohol use: Yes    Comment: occasionally  . Drug use: Not Currently    Comment: last use heroin 7-8 months ago     Allergies   Patient has no known allergies.   Review of Systems Review of Systems  Constitutional: Negative for fever.  Respiratory: Negative for shortness of breath.   Gastrointestinal: Negative for abdominal pain, constipation and vomiting.  Genitourinary: Negative for decreased urine volume, difficulty urinating, dysuria, flank pain and hematuria.  Musculoskeletal: Positive for back pain. Negative for joint swelling.  Skin: Negative for rash.  Neurological: Negative for weakness and numbness.  All other systems reviewed and are negative.    Physical Exam Updated Vital Signs BP 136/78 (BP Location: Right Arm)   Pulse 80   Temp 98.3 F (36.8 C) (Oral)   Resp 16   Ht 5\' 9"  (1.753 m)   Wt 73.9 kg   SpO2 97%   BMI 24.07 kg/m   Physical Exam  Constitutional: He is oriented to person, place, and time. He appears well-developed and well-nourished. No distress.  HENT:  Head: Normocephalic and atraumatic.  Neck: Normal range of motion. Neck supple.  Cardiovascular: Normal rate, regular rhythm and intact distal pulses.  DP pulses are strong and palpable bilaterally  Pulmonary/Chest: Effort normal and breath sounds normal. No respiratory distress.  Abdominal: Soft. He exhibits no distension. There is no tenderness.  Musculoskeletal: He exhibits tenderness. He exhibits no edema.       Lumbar back: He exhibits tenderness and pain. He exhibits normal range of motion, no swelling, no deformity, no laceration and normal pulse.  Diffuse ttp of the lower lumbar spine bilateral lumbar paraspinal muscles.  Pt has 5/5  strength against resistance of bilateral lower extremities.  Negative straight leg raise bilaterally.  Hip flexors and extensors are intact.   Neurological: He is alert and oriented to person, place, and time. He has normal strength. No sensory deficit. He exhibits normal muscle tone. Coordination and gait normal.  Reflex Scores:      Patellar reflexes are 2+ on the right side and 2+ on the left side.      Achilles reflexes are 2+ on the right side and 2+ on the left side. Skin: Skin is warm and dry. Capillary refill takes less than 2 seconds. No rash noted.  Nursing note and vitals reviewed.    ED Treatments / Results  Labs (all labs ordered are listed, but only abnormal results are displayed) Labs Reviewed - No data to display  EKG None  Radiology No results found.  Procedures Procedures (including critical care time)  Medications Ordered in ED Medications - No data to display   Initial Impression / Assessment and Plan / ED Course  I have reviewed the triage vital signs and the nursing notes.  Pertinent labs & imaging results that were available during my care of the patient were reviewed by me and considered in my medical decision making (see chart for details).     Patient is well-appearing.  Ambulatory with a steady gait.  No focal neuro deficits on exam.  No concerning symptoms for emergent neurological process.  Pain is reproducible with palpation.  I feel that is likely musculoskeletal.  Patient agrees to treatment plan and close PCP follow-up if not improving.  Return precautions discussed.  Final Clinical Impressions(s) / ED Diagnoses   Final diagnoses:  Acute midline low back pain without sciatica    ED Discharge Orders         Ordered    predniSONE (DELTASONE) 10 MG tablet     03/28/18 1456    cyclobenzaprine (FLEXERIL) 10 MG tablet  3 times daily PRN     03/28/18 1456    traMADol (ULTRAM) 50 MG tablet  Every 6 hours PRN     03/28/18 1456             Nazareth Norenberg, Babette Relicammy, PA-C 03/30/18 1403    Samuel JesterMcManus, Kathleen, DO 03/31/18 [phone]

## 2018-07-17 ENCOUNTER — Emergency Department (HOSPITAL_COMMUNITY)
Admission: EM | Admit: 2018-07-17 | Discharge: 2018-07-17 | Payer: Self-pay | Attending: Emergency Medicine | Admitting: Emergency Medicine

## 2018-07-17 ENCOUNTER — Other Ambulatory Visit: Payer: Self-pay

## 2018-07-17 ENCOUNTER — Encounter (HOSPITAL_COMMUNITY): Payer: Self-pay | Admitting: Emergency Medicine

## 2018-07-17 DIAGNOSIS — R410 Disorientation, unspecified: Secondary | ICD-10-CM | POA: Insufficient documentation

## 2018-07-17 DIAGNOSIS — F1920 Other psychoactive substance dependence, uncomplicated: Secondary | ICD-10-CM | POA: Insufficient documentation

## 2018-07-17 DIAGNOSIS — M79605 Pain in left leg: Secondary | ICD-10-CM | POA: Insufficient documentation

## 2018-07-17 DIAGNOSIS — F1510 Other stimulant abuse, uncomplicated: Secondary | ICD-10-CM | POA: Insufficient documentation

## 2018-07-17 DIAGNOSIS — F1110 Opioid abuse, uncomplicated: Secondary | ICD-10-CM | POA: Insufficient documentation

## 2018-07-17 DIAGNOSIS — M79601 Pain in right arm: Secondary | ICD-10-CM | POA: Insufficient documentation

## 2018-07-17 DIAGNOSIS — F1910 Other psychoactive substance abuse, uncomplicated: Secondary | ICD-10-CM | POA: Insufficient documentation

## 2018-07-17 DIAGNOSIS — M79604 Pain in right leg: Secondary | ICD-10-CM | POA: Insufficient documentation

## 2018-07-17 DIAGNOSIS — F17200 Nicotine dependence, unspecified, uncomplicated: Secondary | ICD-10-CM | POA: Insufficient documentation

## 2018-07-17 DIAGNOSIS — M79602 Pain in left arm: Secondary | ICD-10-CM | POA: Insufficient documentation

## 2018-07-17 NOTE — ED Provider Notes (Signed)
[facility] EMERGENCY DEPARTMENT Provider Note   CSN: [id number] Arrival date & time: 07/17/18  0515     History   Chief Complaint Chief Complaint  Patient presents with  . Medical Clearance    HPI [name] is a 27 y.o. male.  HPI  27 year old male comes in with chief complaint of medical clearance.  Patient is brought in here by police department.  Allegedly patient was found at a gas station and was lying on a table with syringe in front of him.  Patient admits to heroin and meth use, however he is not 100% sure when he last used.  He recalls sitting in a truck, with [place] however he states that he does not remember anything that transpired since then and he is not sure how he ended up the place where police picked him up.  Currently patient is complaining of pain to his upper and lower extremities bilaterally.  He has no chest pain, shortness of breath or headaches.  Past Medical History:  Diagnosis Date  . Back pain     There are no active problems to display for this patient.   Past Surgical History:  Procedure Laterality Date  . TYMPANOSTOMY TUBE PLACEMENT          Home Medications    Prior to Admission medications   Medication Sig Start Date End Date Taking? Authorizing Provider  acetaminophen (TYLENOL) 500 MG tablet Take 1,000 mg by mouth every 6 (six) hours as needed for moderate pain or headache.    [provider]  cyclobenzaprine (FLEXERIL) 10 MG tablet Take 1 tablet (10 mg total) by mouth 3 (three) times daily as needed. 03/28/18   Triplett, Tammy, PA-C  ibuprofen (ADVIL,MOTRIN) 200 MG tablet Take 800 mg by mouth every 6 (six) hours as needed for headache or moderate pain.    [provider]  predniSONE (DELTASONE) 10 MG tablet Take 6 tablets day one, 5 tablets day two, 4 tablets day three, 3 tablets day four, 2 tablets day five, then 1 tablet day six 03/28/18   Triplett, Tammy, PA-C  traMADol  (ULTRAM) 50 MG tablet Take 1 tablet (50 mg total) by mouth every 6 (six) hours as needed. 03/28/18   Triplett, Babette Relicammy, PA-C    Family History No family history on file.  Social History Social History   Tobacco Use  . Smoking status: Current Every Day Smoker  . Smokeless tobacco: Never Used  Substance Use Topics  . Alcohol use: Yes    Comment: occasionally  . Drug use: Yes    Types: IV, Methamphetamines     Allergies   Patient has no known allergies.   Review of Systems Review of Systems  Constitutional: Positive for activity change.  Respiratory: Negative for shortness of breath.   Cardiovascular: Negative for chest pain.  Musculoskeletal: Positive for arthralgias and myalgias.  Neurological: Negative for headaches.     Physical Exam Updated Vital Signs BP 121/63   Pulse 99   Resp 17   Wt 73.9 kg   SpO2 100%   BMI 24.06 kg/m   Physical Exam  Constitutional: He appears well-developed.  Tearful and emotionally labile  HENT:  Head: Atraumatic.  Eyes: Pupils are equal, round, and reactive to light. EOM are normal.  3 mm and equal  Neck: Neck supple.  Cardiovascular:  Mild tachycardia  Pulmonary/Chest: Effort normal. He has no wheezes.  Abdominal: Soft. There is no tenderness.  Musculoskeletal: He exhibits  tenderness. He exhibits no edema or deformity.  Tenderness over the left lower extremity however patient is able to ambulate  Neurological: He is alert.  Skin: Skin is warm.  Nursing note and vitals reviewed.    ED Treatments / Results  Labs (all labs ordered are listed, but only abnormal results are displayed) Labs Reviewed - No data to display  EKG EKG Interpretation  Date/Time:  Monday July 17 2018 06:01:45 EST Ventricular Rate:  82 PR Interval:    QRS Duration: 91 QT Interval:  383 QTC Calculation: 448 R Axis:   83 Text Interpretation:  Sinus rhythm ST elevation suggests acute pericarditis No acute changes Nonspecific ST abnormality No  significant change since last tracing Confirmed by Derwood Kaplan [phone]) on 07/17/2018 6:13:06 AM   Radiology No results found.  Procedures Procedures (including critical care time)  Medications Ordered in ED Medications - No data to display   Initial Impression / Assessment and Plan / ED Course  I have reviewed the triage vital signs and the nursing notes.  Pertinent labs & imaging results that were available during my care of the patient were reviewed by me and considered in my medical decision making (see chart for details).     Patient brought in by PD for medical clearance.  Patient was allegedly found passed out at a table with syringe in front of him.  Patient has history of meth and heroin abuse.  He is tearful and is confused as to what happened and how he ended up at the location where he was found by PD.  He is complaining of body aches and tingling in his fingers bilaterally.  Patient does not have any evidence of trauma to his head and there is no gross deformity anywhere.  Patient is ambulating.  He is complaining of pain in his left knee, which appears to be slightly erythematous without any edema.  Patient is able to flex his knee bilaterally without significant discomfort.  Screening EKG has been ordered.  No indication for lab work-up at this time given that patient is currently alert and oriented x3 and has no chest pain or shortness of breath.  Final Clinical Impressions(s) / ED Diagnoses   Final diagnoses:  Polysubstance abuse (HCC)  Confused but orients easily    ED Discharge Orders    None       Derwood Kaplan, MD 07/17/18 [phone]

## 2018-07-17 NOTE — ED Triage Notes (Addendum)
Pt brought in via [employer] for medical clearance. Pt was found at a gas station with a syringe lying on the table in front of him. Pt states he does not remember doing any drugs tonight. Pt admits to previous drug use including heroine and meth. Pt states he does "not remember what happened tonight or I how I got here."

## 2018-11-06 ENCOUNTER — Emergency Department (HOSPITAL_COMMUNITY)
Admission: EM | Admit: 2018-11-06 | Discharge: 2018-11-07 | Disposition: A | Discharge status: Home / Self Care | Payer: Self-pay | Attending: Emergency Medicine | Admitting: Emergency Medicine

## 2018-11-06 ENCOUNTER — Encounter (HOSPITAL_COMMUNITY): Payer: Self-pay | Admitting: Emergency Medicine

## 2018-11-06 ENCOUNTER — Other Ambulatory Visit: Payer: Self-pay

## 2018-11-06 DIAGNOSIS — F1110 Opioid abuse, uncomplicated: Secondary | ICD-10-CM | POA: Insufficient documentation

## 2018-11-06 DIAGNOSIS — F17200 Nicotine dependence, unspecified, uncomplicated: Secondary | ICD-10-CM | POA: Insufficient documentation

## 2018-11-06 DIAGNOSIS — J45909 Unspecified asthma, uncomplicated: Secondary | ICD-10-CM | POA: Insufficient documentation

## 2018-11-06 HISTORY — DX: Unspecified asthma, uncomplicated: J45.909

## 2018-11-06 LAB — CBC WITH DIFFERENTIAL/PLATELET
Abs Immature Granulocytes: 0.04 10*3/uL (ref 0.00–0.07)
Basophils Absolute: 0 10*3/uL (ref 0.0–0.1)
Basophils Relative: 0 %
Eosinophils Absolute: 0.1 10*3/uL (ref 0.0–0.5)
Eosinophils Relative: 1 %
HCT: 44.8 % (ref 39.0–52.0)
Hemoglobin: 15.6 g/dL (ref 13.0–17.0)
Immature Granulocytes: 0 %
LYMPHS PCT: 20 %
Lymphs Abs: 2.1 10*3/uL (ref 0.7–4.0)
MCH: 29.4 pg (ref 26.0–34.0)
MCHC: 34.8 g/dL (ref 30.0–36.0)
MCV: 84.4 fL (ref 80.0–100.0)
MONO ABS: 1.4 10*3/uL — AB (ref 0.1–1.0)
Monocytes Relative: 13 %
Neutro Abs: 7.2 10*3/uL (ref 1.7–7.7)
Neutrophils Relative %: 66 %
Platelets: 324 10*3/uL (ref 150–400)
RBC: 5.31 MIL/uL (ref 4.22–5.81)
RDW: 11.9 % (ref 11.5–15.5)
WBC: 10.8 10*3/uL — AB (ref 4.0–10.5)
nRBC: 0 % (ref 0.0–0.2)

## 2018-11-06 LAB — URINALYSIS, ROUTINE W REFLEX MICROSCOPIC
BILIRUBIN URINE: NEGATIVE
Bacteria, UA: NONE SEEN
GLUCOSE, UA: NEGATIVE mg/dL
Ketones, ur: 20 mg/dL — AB
Leukocytes,Ua: NEGATIVE
NITRITE: NEGATIVE
PROTEIN: NEGATIVE mg/dL
Specific Gravity, Urine: 1.003 — ABNORMAL LOW (ref 1.005–1.030)
pH: 7 (ref 5.0–8.0)

## 2018-11-06 LAB — RAPID URINE DRUG SCREEN, HOSP PERFORMED
Amphetamines: POSITIVE — AB
BARBITURATES: NOT DETECTED
Benzodiazepines: NOT DETECTED
Cocaine: POSITIVE — AB
OPIATES: POSITIVE — AB
TETRAHYDROCANNABINOL: NOT DETECTED

## 2018-11-06 LAB — BASIC METABOLIC PANEL
Anion gap: 15 (ref 5–15)
BUN: 11 mg/dL (ref 6–20)
CHLORIDE: 99 mmol/L (ref 98–111)
CO2: 20 mmol/L — ABNORMAL LOW (ref 22–32)
CREATININE: 0.99 mg/dL (ref 0.61–1.24)
Calcium: 9.3 mg/dL (ref 8.9–10.3)
GFR calc Af Amer: >60 mL/min (ref >60)
GFR calc non Af Amer: >60 mL/min (ref >60)
GLUCOSE: 97 mg/dL (ref 70–99)
Potassium: 3.1 mmol/L — ABNORMAL LOW (ref 3.5–5.1)
SODIUM: 134 mmol/L — AB (ref 135–145)

## 2018-11-06 NOTE — ED Notes (Signed)
Patient states "It's hard to tell you how my symptoms. Its just weird. I'm really tired but I can't sleep." States last use of heroin, xanax, cocaine was yesterday. Denies SI. States "I need more help than just detox. I need help staying off of the drugs." Patient has several bags of items and food with him. States he is homeless.

## 2018-11-06 NOTE — ED Provider Notes (Signed)
[facility] EMERGENCY DEPARTMENT Provider Note   CSN: [id number] Arrival date & time: 11/06/18  2126    History   Chief Complaint Chief Complaint  Patient presents with  . detox    HPI [name] is a 28 y.o. male.     Patient recently relapsed from heroin.  Life stressors include just getting out of jail, a restraining order by his ex-girlfriend who mother did his child, lack of money, lack of home.  No suicidal or homicidal ideation.  He has had a heroin problem intermittently for 5 years.  Yesterday he used heroin, Xanax, cocaine.  No evidence of psychosis.  Severity of symptoms is moderate.     Past Medical History:  Diagnosis Date  . Asthma   . Back pain     There are no active problems to display for this patient.   Past Surgical History:  Procedure Laterality Date  . TYMPANOSTOMY TUBE PLACEMENT          Home Medications    Prior to Admission medications   Medication Sig Start Date End Date Taking? Authorizing Provider  acetaminophen (TYLENOL) 500 MG tablet Take 1,000 mg by mouth every 6 (six) hours as needed for moderate pain or headache.    [provider]  cyclobenzaprine (FLEXERIL) 10 MG tablet Take 1 tablet (10 mg total) by mouth 3 (three) times daily as needed. 03/28/18   Triplett, Tammy, PA-C  ibuprofen (ADVIL,MOTRIN) 200 MG tablet Take 800 mg by mouth every 6 (six) hours as needed for headache or moderate pain.    [provider]  predniSONE (DELTASONE) 10 MG tablet Take 6 tablets day one, 5 tablets day two, 4 tablets day three, 3 tablets day four, 2 tablets day five, then 1 tablet day six 03/28/18   Triplett, Tammy, PA-C  traMADol (ULTRAM) 50 MG tablet Take 1 tablet (50 mg total) by mouth every 6 (six) hours as needed. 03/28/18   Triplett, Babette Relic, PA-C    Family History No family history on file.  Social History Social History   Tobacco Use  . Smoking status: Current Every Day Smoker  . Smokeless tobacco: Never Used   Substance Use Topics  . Alcohol use: Yes    Comment: occasionally  . Drug use: Yes    Types: IV, Methamphetamines    Comment: heroin     Allergies   Patient has no known allergies.   Review of Systems Review of Systems  All other systems reviewed and are negative.    Physical Exam Updated Vital Signs BP (!) 119/101 (BP Location: Right Arm)   Pulse (!) 127   Temp 98 F (36.7 C) (Oral)   Resp 18   Ht 5\' 9"  (1.753 m)   Wt 68 kg   SpO2 98%   BMI 22.15 kg/m   Physical Exam Vitals signs and nursing note reviewed.  Constitutional:      Appearance: He is well-developed.  HENT:     Head: Normocephalic and atraumatic.  Eyes:     Conjunctiva/sclera: Conjunctivae normal.  Neck:     Musculoskeletal: Neck supple.  Cardiovascular:     Rate and Rhythm: Normal rate and regular rhythm.  Pulmonary:     Effort: Pulmonary effort is normal.     Breath sounds: Normal breath sounds.  Abdominal:     General: Bowel sounds are normal.     Palpations: Abdomen is soft.  Musculoskeletal: Normal range of motion.  Skin:    General: Skin is warm and  dry.  Neurological:     Mental Status: He is alert and oriented to person, place, and time.  Psychiatric:     Comments: Flat affect, depressed      ED Treatments / Results  Labs (all labs ordered are listed, but only abnormal results are displayed) Labs Reviewed  CBC WITH DIFFERENTIAL/PLATELET - Abnormal; Notable for the following components:      Result Value   WBC 10.8 (*)    Monocytes Absolute 1.4 (*)    All other components within normal limits  BASIC METABOLIC PANEL - Abnormal; Notable for the following components:   Sodium 134 (*)    Potassium 3.1 (*)    CO2 20 (*)    All other components within normal limits  RAPID URINE DRUG SCREEN, HOSP PERFORMED - Abnormal; Notable for the following components:   Opiates POSITIVE (*)    Cocaine POSITIVE (*)    Amphetamines POSITIVE (*)    All other components within normal limits   URINALYSIS, ROUTINE W REFLEX MICROSCOPIC - Abnormal; Notable for the following components:   Color, Urine STRAW (*)    Specific Gravity, Urine 1.003 (*)    Hgb urine dipstick SMALL (*)    Ketones, ur 20 (*)    All other components within normal limits    EKG None  Radiology No results found.  Procedures Procedures (including critical care time)  Medications Ordered in ED Medications - No data to display   Initial Impression / Assessment and Plan / ED Course  I have reviewed the triage vital signs and the nursing notes.  Pertinent labs & imaging results that were available during my care of the patient were reviewed by me and considered in my medical decision making (see chart for details).        Patient presents with polysubstance abuse.  He is most concerned about heroin.  He is not psychotic.  Resource guide given.  Final Clinical Impressions(s) / ED Diagnoses   Final diagnoses:  Opiate abuse, episodic [facility])    ED Discharge Orders    None       Donnetta Hutching, MD 11/06/18 2324

## 2018-11-06 NOTE — Discharge Instructions (Addendum)
Resource guide given.  Recommend Narcotics Anonymous.

## 2018-11-06 NOTE — ED Triage Notes (Signed)
Pt states he wants help de-toxing from heroin. States recently got out of jail and has relapsed and now wants help. Last use was yesterday evening.

## 2020-05-10 ENCOUNTER — Inpatient Hospital Stay (HOSPITAL_COMMUNITY)
Admission: EM | Admit: 2020-05-10 | Discharge: 2020-07-02 | DRG: 871 | Disposition: A | Discharge status: Home / Self Care | Payer: Self-pay | Attending: Internal Medicine | Admitting: Internal Medicine

## 2020-05-10 ENCOUNTER — Emergency Department (HOSPITAL_COMMUNITY): Payer: Self-pay

## 2020-05-10 ENCOUNTER — Other Ambulatory Visit: Payer: Self-pay

## 2020-05-10 ENCOUNTER — Inpatient Hospital Stay (HOSPITAL_COMMUNITY): Payer: Self-pay

## 2020-05-10 ENCOUNTER — Encounter (HOSPITAL_COMMUNITY): Payer: Self-pay | Admitting: Family Medicine

## 2020-05-10 DIAGNOSIS — E8770 Fluid overload, unspecified: Secondary | ICD-10-CM | POA: Diagnosis not present

## 2020-05-10 DIAGNOSIS — H1132 Conjunctival hemorrhage, left eye: Secondary | ICD-10-CM | POA: Diagnosis not present

## 2020-05-10 DIAGNOSIS — F1910 Other psychoactive substance abuse, uncomplicated: Secondary | ICD-10-CM | POA: Diagnosis present

## 2020-05-10 DIAGNOSIS — S42125A Nondisplaced fracture of acromial process, left shoulder, initial encounter for closed fracture: Secondary | ICD-10-CM | POA: Diagnosis not present

## 2020-05-10 DIAGNOSIS — Z9889 Other specified postprocedural states: Secondary | ICD-10-CM

## 2020-05-10 DIAGNOSIS — L03116 Cellulitis of left lower limb: Secondary | ICD-10-CM | POA: Diagnosis present

## 2020-05-10 DIAGNOSIS — Y92239 Unspecified place in hospital as the place of occurrence of the external cause: Secondary | ICD-10-CM | POA: Diagnosis not present

## 2020-05-10 DIAGNOSIS — J189 Pneumonia, unspecified organism: Secondary | ICD-10-CM

## 2020-05-10 DIAGNOSIS — D649 Anemia, unspecified: Secondary | ICD-10-CM | POA: Diagnosis present

## 2020-05-10 DIAGNOSIS — R0902 Hypoxemia: Secondary | ICD-10-CM

## 2020-05-10 DIAGNOSIS — L0390 Cellulitis, unspecified: Secondary | ICD-10-CM | POA: Diagnosis present

## 2020-05-10 DIAGNOSIS — B965 Pseudomonas (aeruginosa) (mallei) (pseudomallei) as the cause of diseases classified elsewhere: Secondary | ICD-10-CM | POA: Diagnosis present

## 2020-05-10 DIAGNOSIS — N179 Acute kidney failure, unspecified: Secondary | ICD-10-CM

## 2020-05-10 DIAGNOSIS — E861 Hypovolemia: Secondary | ICD-10-CM | POA: Diagnosis present

## 2020-05-10 DIAGNOSIS — F1120 Opioid dependence, uncomplicated: Secondary | ICD-10-CM | POA: Diagnosis present

## 2020-05-10 DIAGNOSIS — F321 Major depressive disorder, single episode, moderate: Secondary | ICD-10-CM | POA: Diagnosis present

## 2020-05-10 DIAGNOSIS — L02416 Cutaneous abscess of left lower limb: Secondary | ICD-10-CM | POA: Diagnosis present

## 2020-05-10 DIAGNOSIS — F419 Anxiety disorder, unspecified: Secondary | ICD-10-CM | POA: Diagnosis present

## 2020-05-10 DIAGNOSIS — W06XXXA Fall from bed, initial encounter: Secondary | ICD-10-CM | POA: Diagnosis not present

## 2020-05-10 DIAGNOSIS — M79601 Pain in right arm: Secondary | ICD-10-CM | POA: Diagnosis not present

## 2020-05-10 DIAGNOSIS — N170 Acute kidney failure with tubular necrosis: Secondary | ICD-10-CM | POA: Diagnosis present

## 2020-05-10 DIAGNOSIS — A419 Sepsis, unspecified organism: Secondary | ICD-10-CM | POA: Diagnosis present

## 2020-05-10 DIAGNOSIS — M25512 Pain in left shoulder: Secondary | ICD-10-CM | POA: Diagnosis present

## 2020-05-10 DIAGNOSIS — N183 Chronic kidney disease, stage 3 unspecified: Secondary | ICD-10-CM | POA: Diagnosis present

## 2020-05-10 DIAGNOSIS — S43492A Other sprain of left shoulder joint, initial encounter: Secondary | ICD-10-CM | POA: Diagnosis present

## 2020-05-10 DIAGNOSIS — R109 Unspecified abdominal pain: Secondary | ICD-10-CM

## 2020-05-10 DIAGNOSIS — E872 Acidosis: Secondary | ICD-10-CM | POA: Diagnosis present

## 2020-05-10 DIAGNOSIS — F17200 Nicotine dependence, unspecified, uncomplicated: Secondary | ICD-10-CM | POA: Diagnosis present

## 2020-05-10 DIAGNOSIS — A4102 Sepsis due to Methicillin resistant Staphylococcus aureus: Principal | ICD-10-CM | POA: Diagnosis present

## 2020-05-10 DIAGNOSIS — M60862 Other myositis, left lower leg: Secondary | ICD-10-CM | POA: Diagnosis present

## 2020-05-10 DIAGNOSIS — F332 Major depressive disorder, recurrent severe without psychotic features: Secondary | ICD-10-CM | POA: Diagnosis present

## 2020-05-10 DIAGNOSIS — M609 Myositis, unspecified: Secondary | ICD-10-CM

## 2020-05-10 DIAGNOSIS — R7881 Bacteremia: Secondary | ICD-10-CM | POA: Diagnosis present

## 2020-05-10 DIAGNOSIS — R079 Chest pain, unspecified: Secondary | ICD-10-CM

## 2020-05-10 DIAGNOSIS — Z5320 Procedure and treatment not carried out because of patient's decision for unspecified reasons: Secondary | ICD-10-CM | POA: Diagnosis not present

## 2020-05-10 DIAGNOSIS — N19 Unspecified kidney failure: Secondary | ICD-10-CM | POA: Diagnosis present

## 2020-05-10 DIAGNOSIS — I76 Septic arterial embolism: Secondary | ICD-10-CM | POA: Diagnosis not present

## 2020-05-10 DIAGNOSIS — M5127 Other intervertebral disc displacement, lumbosacral region: Secondary | ICD-10-CM | POA: Diagnosis present

## 2020-05-10 DIAGNOSIS — M25562 Pain in left knee: Secondary | ICD-10-CM | POA: Diagnosis present

## 2020-05-10 DIAGNOSIS — E46 Unspecified protein-calorie malnutrition: Secondary | ICD-10-CM | POA: Diagnosis present

## 2020-05-10 DIAGNOSIS — F1990 Other psychoactive substance use, unspecified, uncomplicated: Secondary | ICD-10-CM | POA: Diagnosis present

## 2020-05-10 DIAGNOSIS — Z5902 Unsheltered homelessness: Secondary | ICD-10-CM

## 2020-05-10 DIAGNOSIS — R6520 Severe sepsis without septic shock: Secondary | ICD-10-CM | POA: Diagnosis present

## 2020-05-10 DIAGNOSIS — E871 Hypo-osmolality and hyponatremia: Secondary | ICD-10-CM | POA: Diagnosis present

## 2020-05-10 DIAGNOSIS — M00062 Staphylococcal arthritis, left knee: Secondary | ICD-10-CM | POA: Diagnosis present

## 2020-05-10 DIAGNOSIS — E875 Hyperkalemia: Secondary | ICD-10-CM | POA: Diagnosis not present

## 2020-05-10 DIAGNOSIS — R0600 Dyspnea, unspecified: Secondary | ICD-10-CM

## 2020-05-10 DIAGNOSIS — B9562 Methicillin resistant Staphylococcus aureus infection as the cause of diseases classified elsewhere: Secondary | ICD-10-CM

## 2020-05-10 DIAGNOSIS — Z23 Encounter for immunization: Secondary | ICD-10-CM

## 2020-05-10 DIAGNOSIS — J90 Pleural effusion, not elsewhere classified: Secondary | ICD-10-CM

## 2020-05-10 DIAGNOSIS — Z6825 Body mass index (BMI) 25.0-25.9, adult: Secondary | ICD-10-CM

## 2020-05-10 DIAGNOSIS — L03114 Cellulitis of left upper limb: Secondary | ICD-10-CM | POA: Diagnosis present

## 2020-05-10 DIAGNOSIS — K5649 Other impaction of intestine: Secondary | ICD-10-CM | POA: Diagnosis not present

## 2020-05-10 DIAGNOSIS — M009 Pyogenic arthritis, unspecified: Secondary | ICD-10-CM | POA: Diagnosis present

## 2020-05-10 DIAGNOSIS — J9601 Acute respiratory failure with hypoxia: Secondary | ICD-10-CM | POA: Diagnosis not present

## 2020-05-10 DIAGNOSIS — E0781 Sick-euthyroid syndrome: Secondary | ICD-10-CM | POA: Diagnosis not present

## 2020-05-10 DIAGNOSIS — G4700 Insomnia, unspecified: Secondary | ICD-10-CM | POA: Diagnosis not present

## 2020-05-10 DIAGNOSIS — R0781 Pleurodynia: Secondary | ICD-10-CM

## 2020-05-10 DIAGNOSIS — R748 Abnormal levels of other serum enzymes: Secondary | ICD-10-CM | POA: Diagnosis present

## 2020-05-10 DIAGNOSIS — M25462 Effusion, left knee: Secondary | ICD-10-CM | POA: Diagnosis present

## 2020-05-10 DIAGNOSIS — B182 Chronic viral hepatitis C: Secondary | ICD-10-CM | POA: Diagnosis present

## 2020-05-10 DIAGNOSIS — W19XXXA Unspecified fall, initial encounter: Secondary | ICD-10-CM

## 2020-05-10 DIAGNOSIS — E860 Dehydration: Secondary | ICD-10-CM | POA: Diagnosis present

## 2020-05-10 DIAGNOSIS — Z20822 Contact with and (suspected) exposure to covid-19: Secondary | ICD-10-CM | POA: Diagnosis present

## 2020-05-10 LAB — CBC WITH DIFFERENTIAL/PLATELET
Abs Immature Granulocytes: 0.12 10*3/uL — ABNORMAL HIGH (ref 0.00–0.07)
Basophils Absolute: 0 10*3/uL (ref 0.0–0.1)
Basophils Relative: 0 %
Eosinophils Absolute: 0.3 10*3/uL (ref 0.0–0.5)
Eosinophils Relative: 2 %
HCT: 31.4 % — ABNORMAL LOW (ref 39.0–52.0)
Hemoglobin: 11 g/dL — ABNORMAL LOW (ref 13.0–17.0)
Immature Granulocytes: 1 %
Lymphocytes Relative: 6 %
Lymphs Abs: 1 10*3/uL (ref 0.7–4.0)
MCH: 28.9 pg (ref 26.0–34.0)
MCHC: 35 g/dL (ref 30.0–36.0)
MCV: 82.4 fL (ref 80.0–100.0)
Monocytes Absolute: 1.3 10*3/uL — ABNORMAL HIGH (ref 0.1–1.0)
Monocytes Relative: 7 %
Neutro Abs: 14.9 10*3/uL — ABNORMAL HIGH (ref 1.7–7.7)
Neutrophils Relative %: 84 %
Platelets: 433 10*3/uL — ABNORMAL HIGH (ref 150–400)
RBC: 3.81 MIL/uL — ABNORMAL LOW (ref 4.22–5.81)
RDW: 12.6 % (ref 11.5–15.5)
WBC: 17.6 10*3/uL — ABNORMAL HIGH (ref 4.0–10.5)
nRBC: 0 % (ref 0.0–0.2)

## 2020-05-10 LAB — RAPID URINE DRUG SCREEN, HOSP PERFORMED
Amphetamines: POSITIVE — AB
Barbiturates: NOT DETECTED
Benzodiazepines: NOT DETECTED
Cocaine: NOT DETECTED
Opiates: POSITIVE — AB
Tetrahydrocannabinol: NOT DETECTED

## 2020-05-10 LAB — URINALYSIS, ROUTINE W REFLEX MICROSCOPIC
Bilirubin Urine: NEGATIVE
Glucose, UA: NEGATIVE mg/dL
Ketones, ur: NEGATIVE mg/dL
Leukocytes,Ua: NEGATIVE
Nitrite: NEGATIVE
Protein, ur: 30 mg/dL — AB
Specific Gravity, Urine: 1.012 (ref 1.005–1.030)
pH: 5 (ref 5.0–8.0)

## 2020-05-10 LAB — I-STAT CHEM 8, ED
BUN: 104 mg/dL — ABNORMAL HIGH (ref 6–20)
Calcium, Ion: 0.97 mmol/L — ABNORMAL LOW (ref 1.15–1.40)
Chloride: 96 mmol/L — ABNORMAL LOW (ref 98–111)
Creatinine, Ser: 10.7 mg/dL — ABNORMAL HIGH (ref 0.61–1.24)
Glucose, Bld: 100 mg/dL — ABNORMAL HIGH (ref 70–99)
HCT: 32 % — ABNORMAL LOW (ref 39.0–52.0)
Hemoglobin: 10.9 g/dL — ABNORMAL LOW (ref 13.0–17.0)
Potassium: 4.6 mmol/L (ref 3.5–5.1)
Sodium: 129 mmol/L — ABNORMAL LOW (ref 135–145)
TCO2: 18 mmol/L — ABNORMAL LOW (ref 22–32)

## 2020-05-10 LAB — COMPREHENSIVE METABOLIC PANEL
ALT: 26 U/L (ref 0–44)
AST: 25 U/L (ref 15–41)
Albumin: 2.2 g/dL — ABNORMAL LOW (ref 3.5–5.0)
Alkaline Phosphatase: 60 U/L (ref 38–126)
Anion gap: 17 — ABNORMAL HIGH (ref 5–15)
BUN: 91 mg/dL — ABNORMAL HIGH (ref 6–20)
CO2: 19 mmol/L — ABNORMAL LOW (ref 22–32)
Calcium: 8 mg/dL — ABNORMAL LOW (ref 8.9–10.3)
Chloride: 92 mmol/L — ABNORMAL LOW (ref 98–111)
Creatinine, Ser: 10.19 mg/dL — ABNORMAL HIGH (ref 0.61–1.24)
GFR calc Af Amer: 7 mL/min — ABNORMAL LOW (ref >60)
GFR calc non Af Amer: 6 mL/min — ABNORMAL LOW (ref >60)
Glucose, Bld: 116 mg/dL — ABNORMAL HIGH (ref 70–99)
Potassium: 4.6 mmol/L (ref 3.5–5.1)
Sodium: 128 mmol/L — ABNORMAL LOW (ref 135–145)
Total Bilirubin: 0.3 mg/dL (ref 0.3–1.2)
Total Protein: 6.6 g/dL (ref 6.5–8.1)

## 2020-05-10 LAB — PROTIME-INR
INR: 1 (ref 0.8–1.2)
Prothrombin Time: 12.9 seconds (ref 11.4–15.2)

## 2020-05-10 LAB — LACTIC ACID, PLASMA
Lactic Acid, Venous: 0.9 mmol/L (ref 0.5–1.9)
Lactic Acid, Venous: 1.1 mmol/L (ref 0.5–1.9)

## 2020-05-10 LAB — CK: Total CK: 525 U/L — ABNORMAL HIGH (ref 49–397)

## 2020-05-10 LAB — SODIUM, URINE, RANDOM: Sodium, Ur: 20 mmol/L

## 2020-05-10 LAB — CREATININE, URINE, RANDOM: Creatinine, Urine: 170.6 mg/dL

## 2020-05-10 LAB — LIPASE, BLOOD: Lipase: 102 U/L — ABNORMAL HIGH (ref 11–51)

## 2020-05-10 LAB — SARS CORONAVIRUS 2 BY RT PCR (HOSPITAL ORDER, PERFORMED IN ~~LOC~~ HOSPITAL LAB): SARS Coronavirus 2: NEGATIVE

## 2020-05-10 LAB — APTT: aPTT: 35 seconds (ref 24–36)

## 2020-05-10 IMAGING — CR DG SHOULDER 2+V*L*
2 series · 2 of 2 positions shown · non-contrast
Comparison: None.

CLINICAL DATA: Left shoulder pain

EXAM:
LEFT SHOULDER - 2+ VIEW

[x shoulder ap left (1 of 2)]
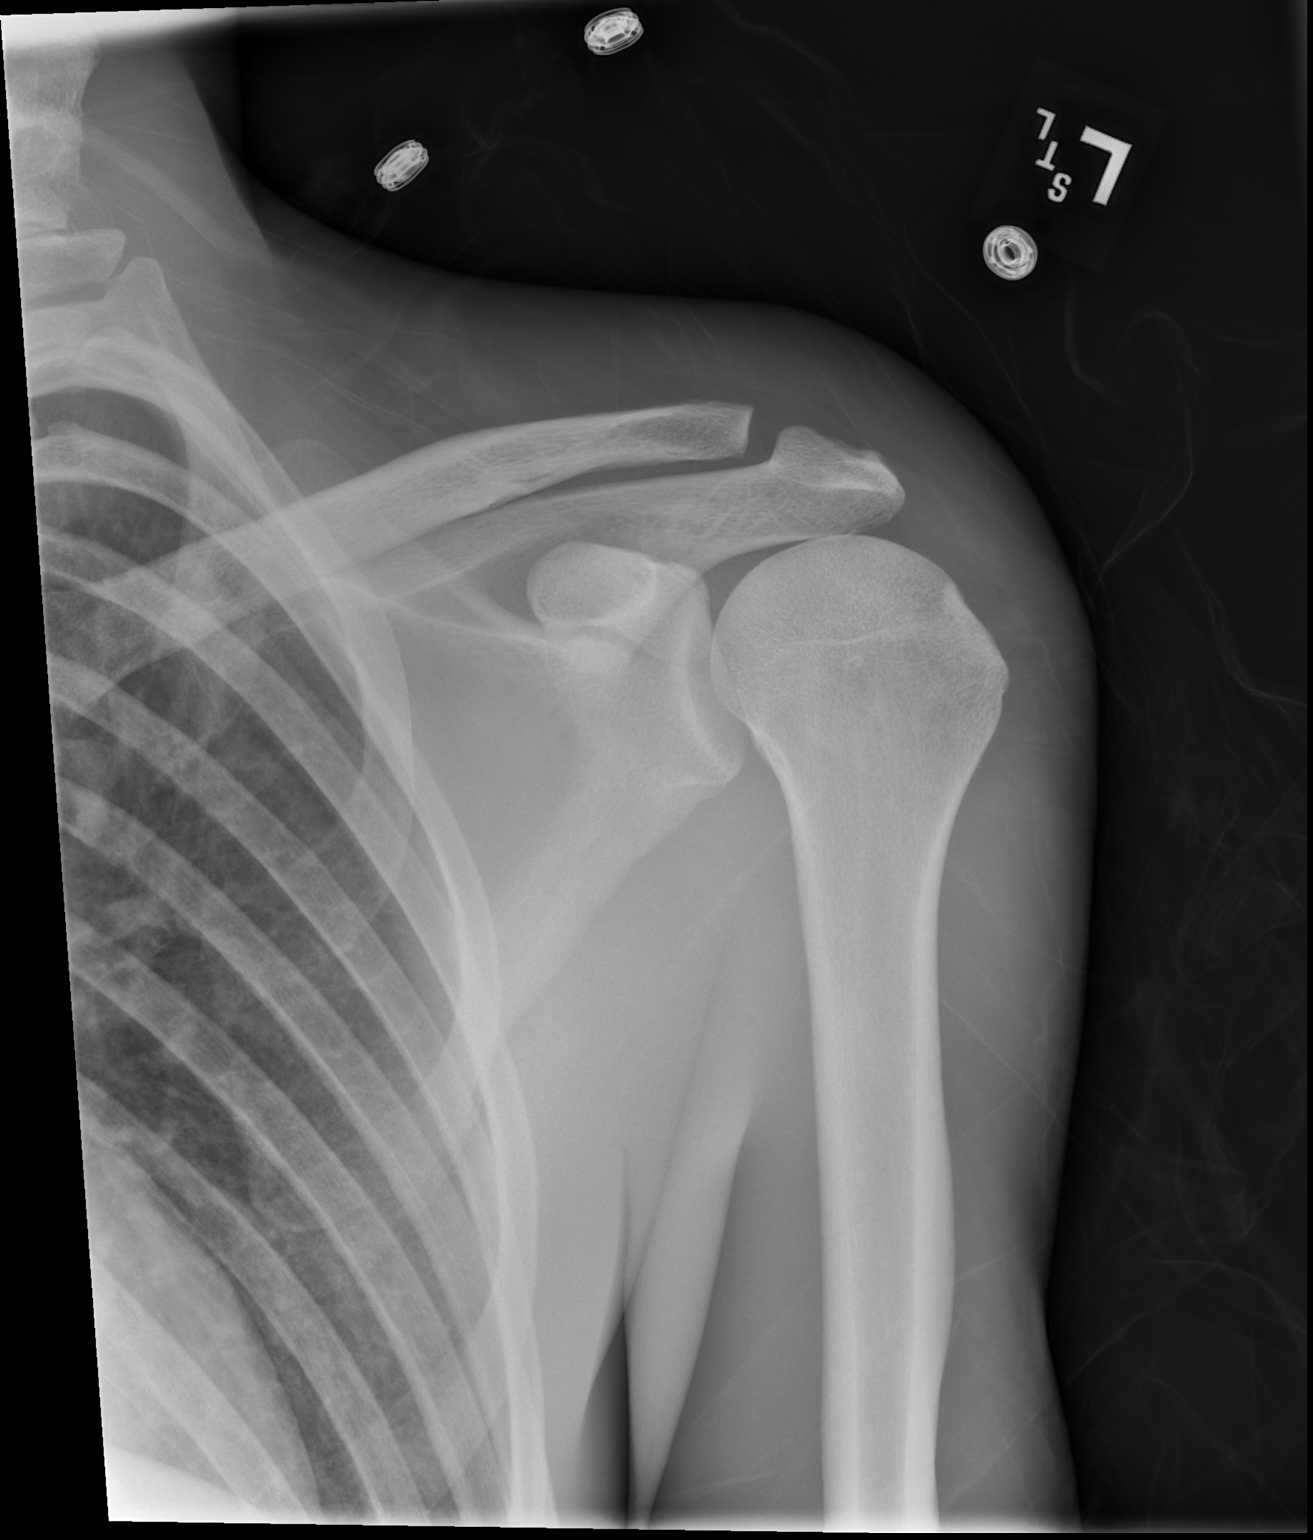

[x shoulder ap left (2 of 2)]
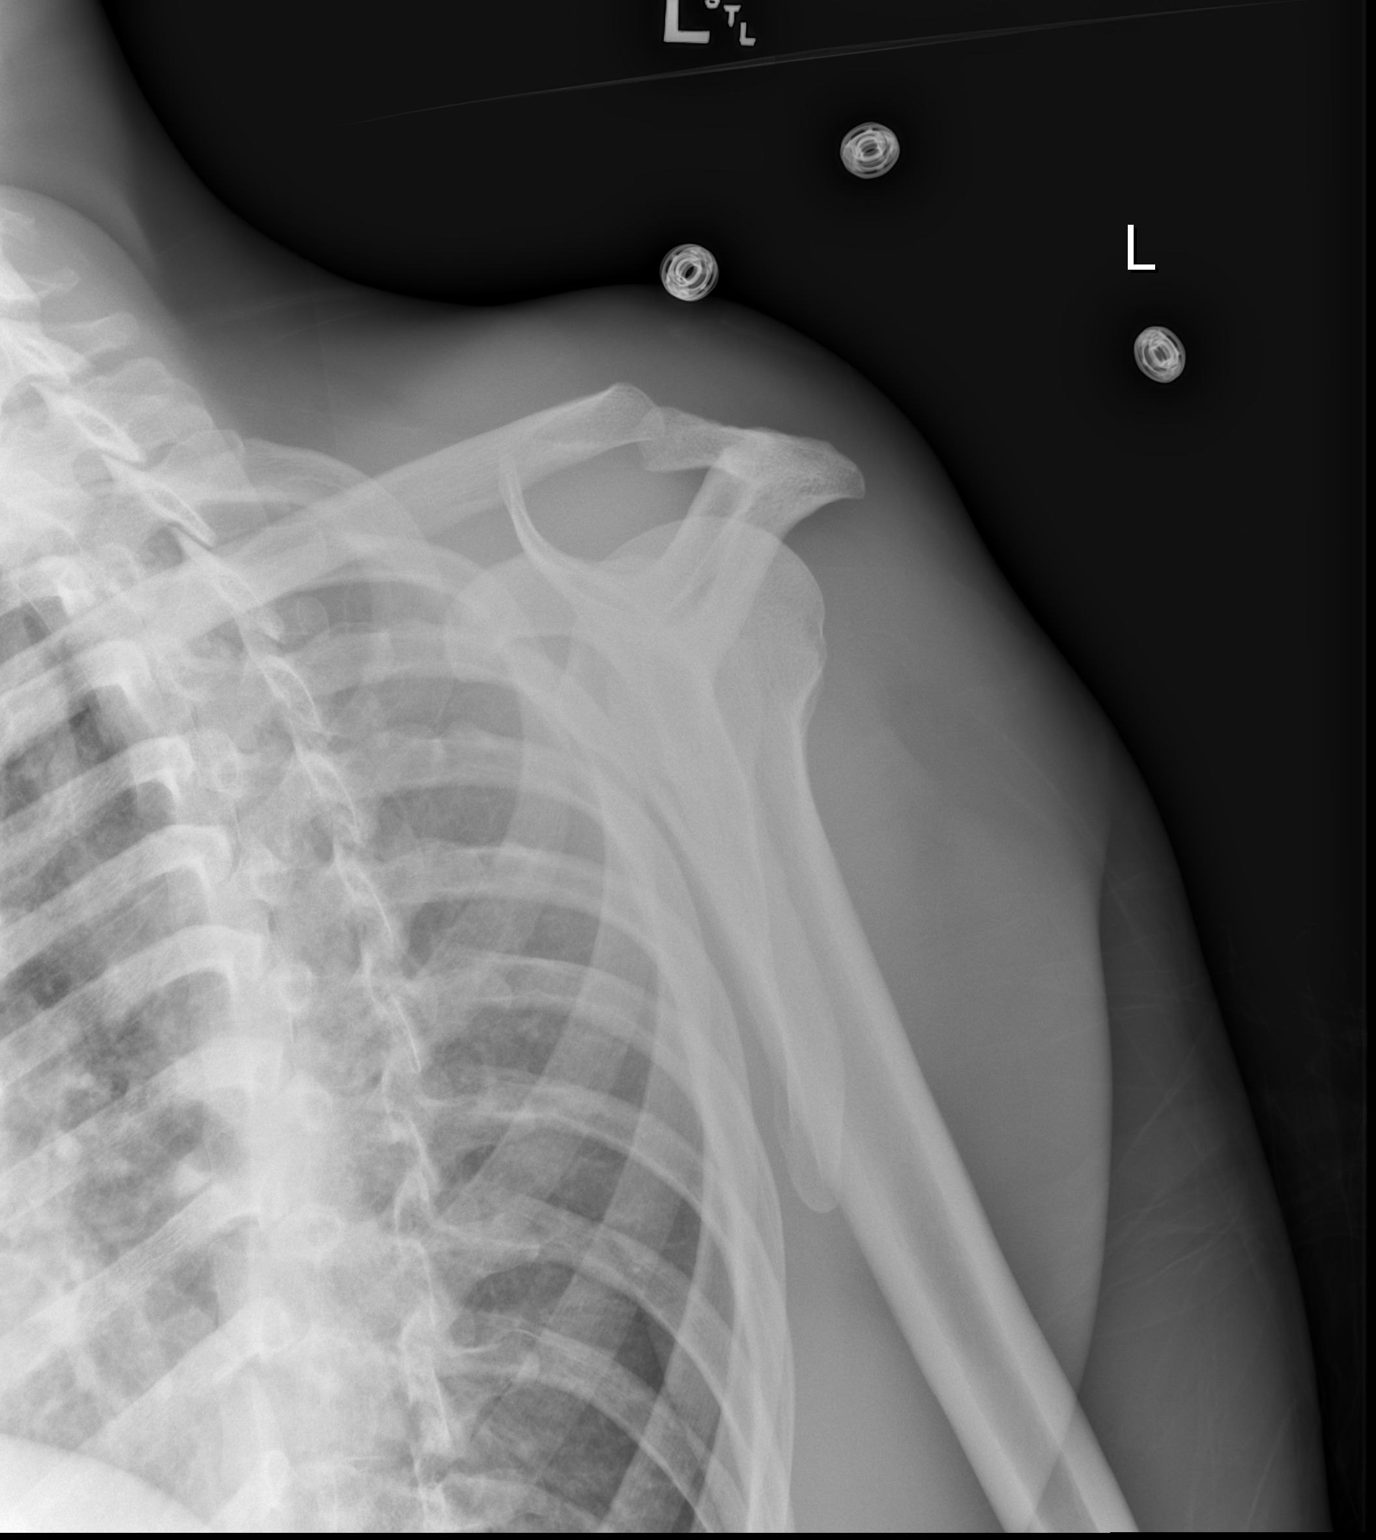

[2 of 2 positions shown; findings below may reference images not displayed]

FINDINGS: Frontal and transscapular views of the left shoulder demonstrate no
fracture, subluxation, or dislocation. Joint spaces are well
preserved. Left chest is clear.
IMPRESSION: 1. Unremarkable left shoulder.

## 2020-05-10 IMAGING — CR DG CHEST 1V PORT
1 series · 1 of 1 positions shown · non-contrast
Comparison: [DATE]

CLINICAL DATA: Sepsis, left shoulder pain

EXAM:
PORTABLE CHEST 1 VIEW

[x chest ap]
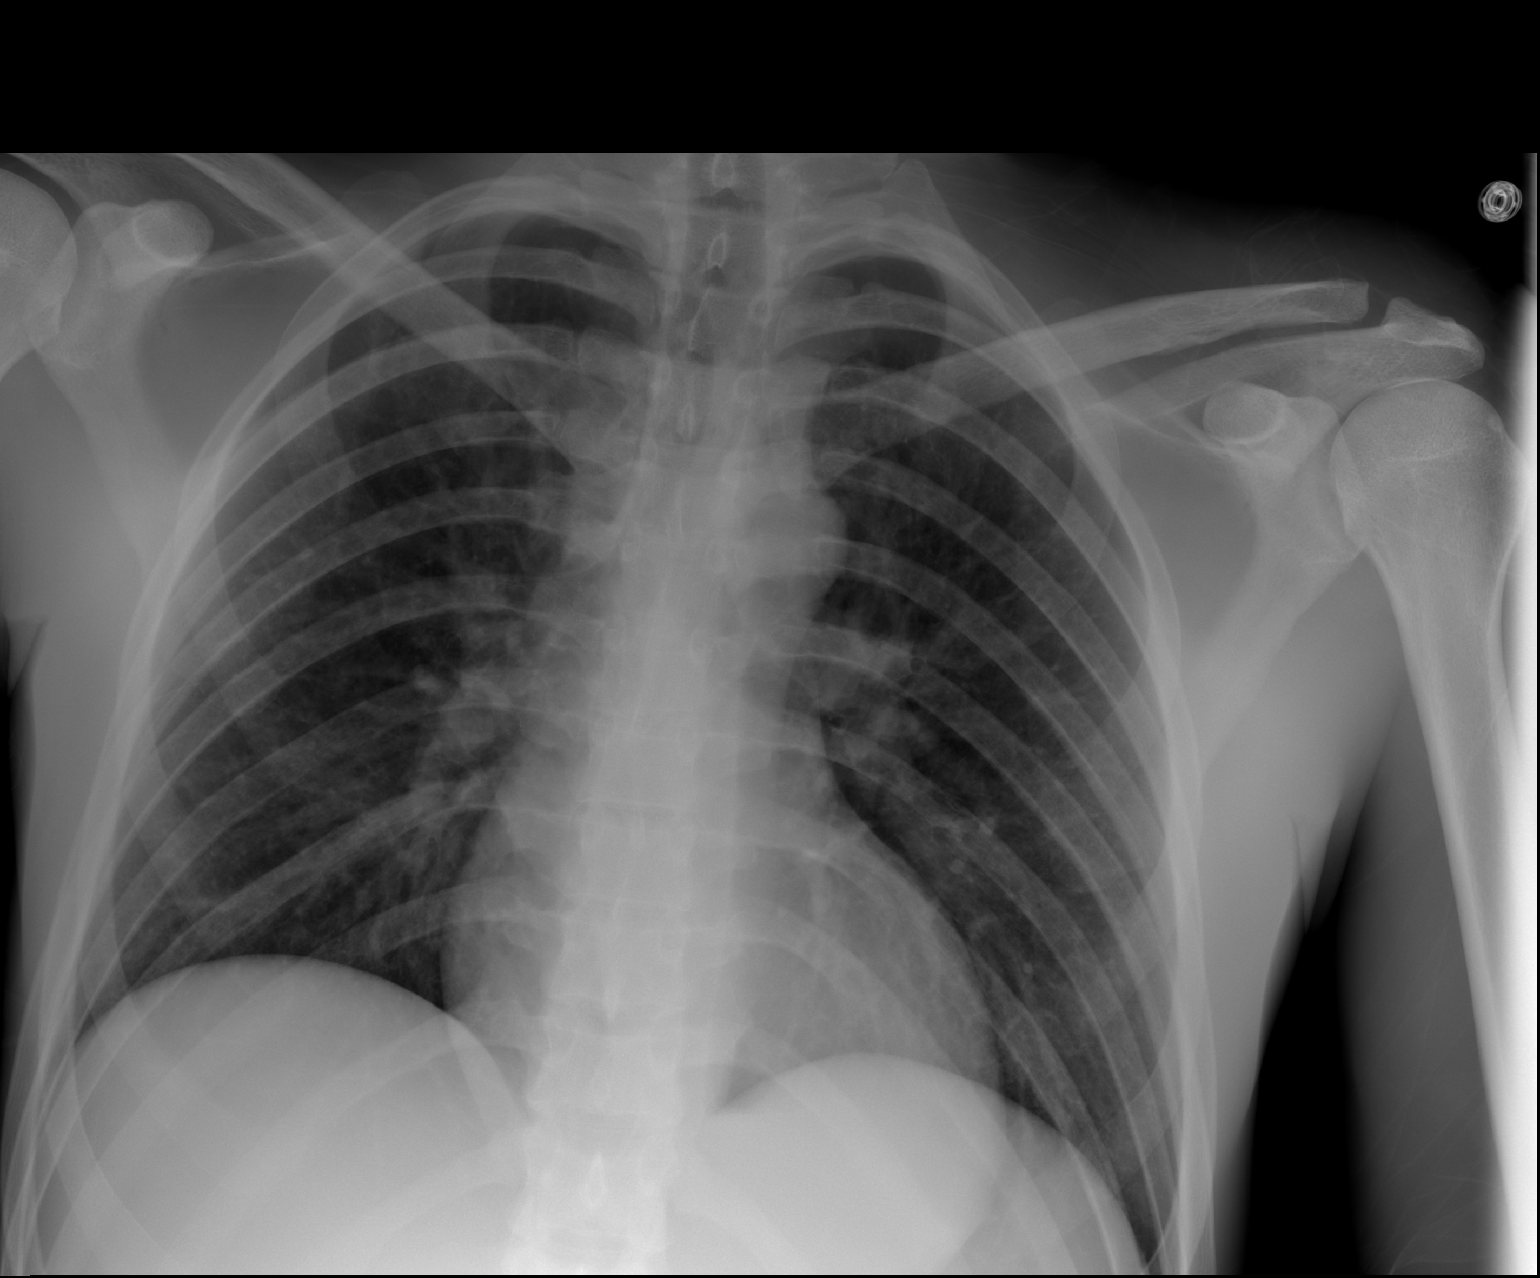

[1 of 1 positions shown; findings below may reference images not displayed]

FINDINGS: Single frontal view of the chest demonstrates an unremarkable
cardiac silhouette. No airspace disease, effusion, or pneumothorax.
No acute bony abnormalities.
IMPRESSION: 1. No acute intrathoracic process.
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
## 2020-05-10 IMAGING — CR DG KNEE 1-2V*L*
2 series · 2 of 2 positions shown · non-contrast
Comparison: None.

CLINICAL DATA: Left knee pain and swelling

EXAM:
LEFT KNEE - 1-2 VIEW

[x knee ap left (1 of 2)]
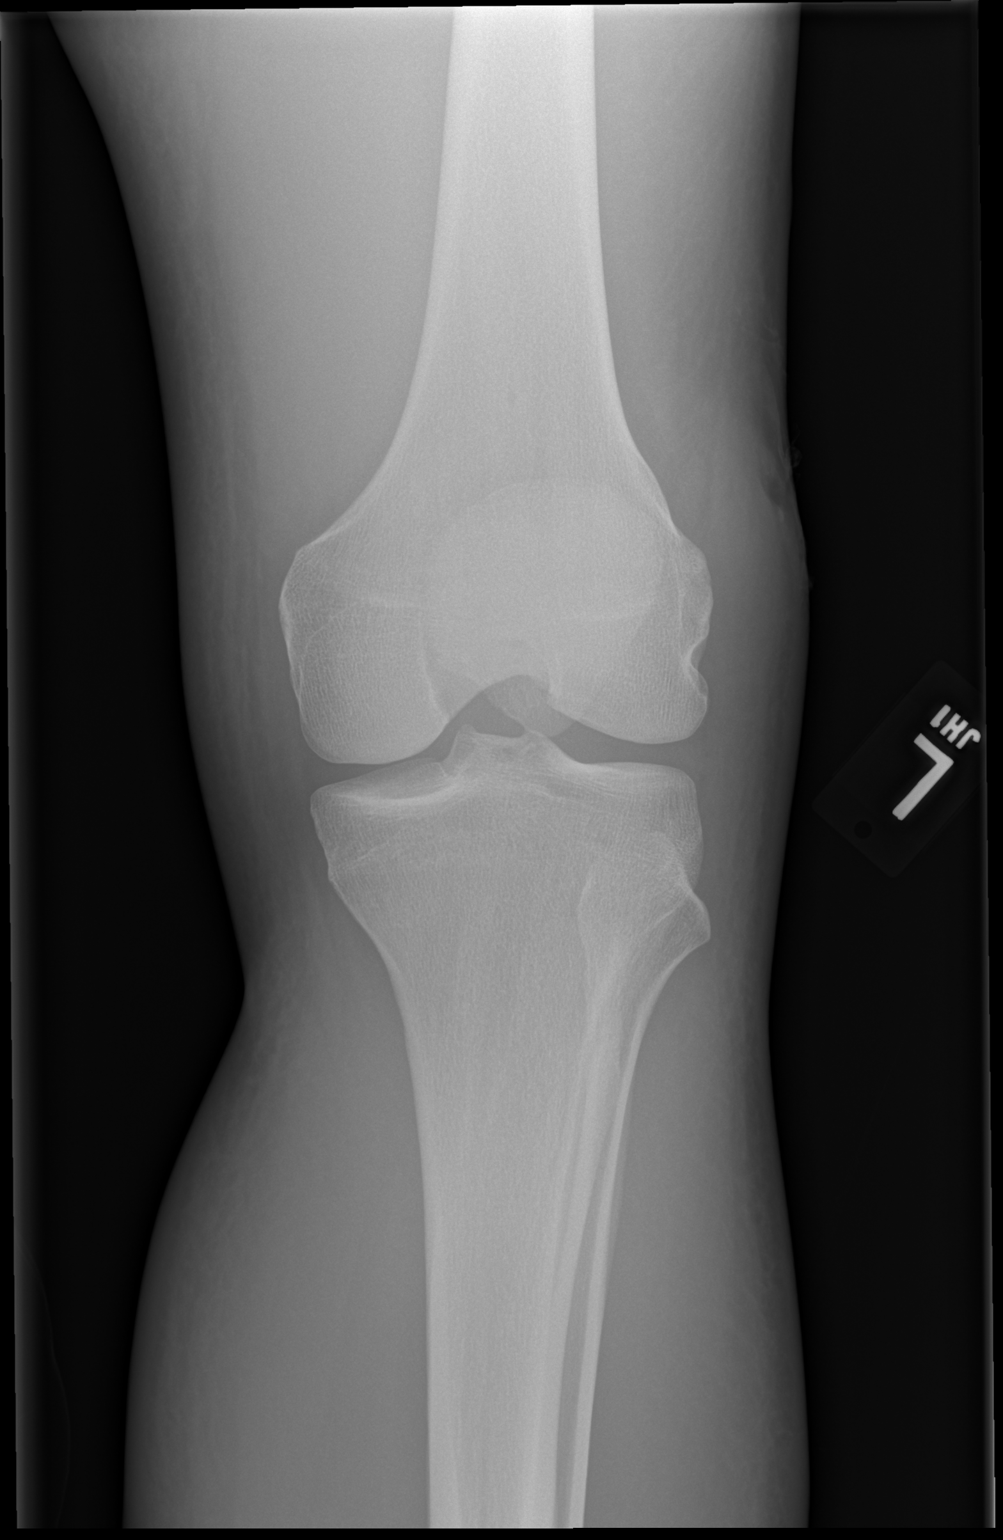

[x knee ap left (2 of 2)]
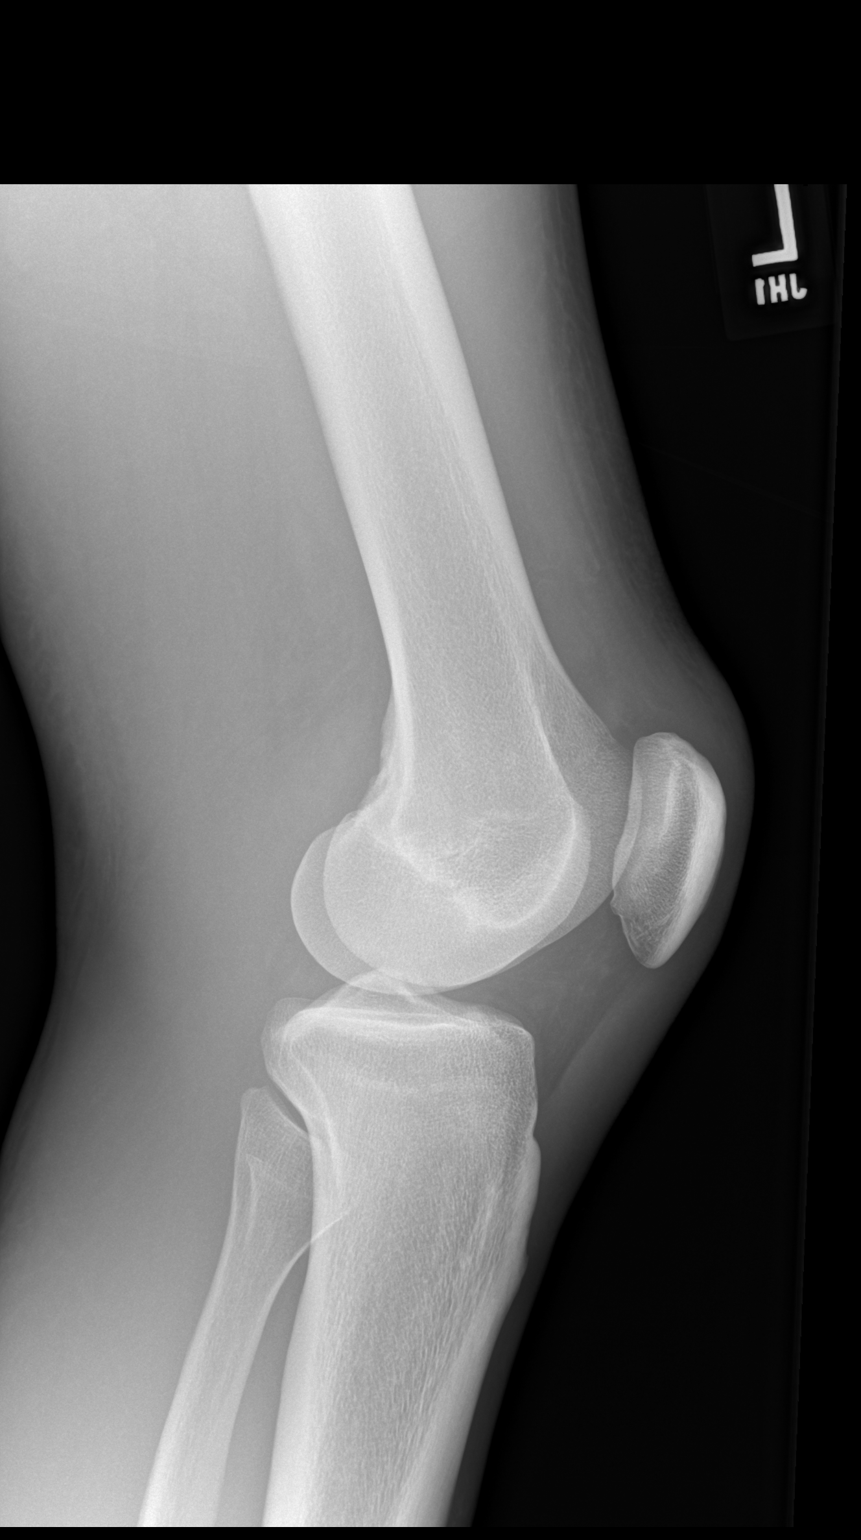

[2 of 2 positions shown; findings below may reference images not displayed]

FINDINGS: Frontal and lateral views of the left knee demonstrate no fracture,
subluxation, or dislocation. Joint spaces are well preserved. No
joint effusion. There is mild prepatellar soft tissue edema.
IMPRESSION: 1. Prepatellar soft tissue edema.  No acute bony abnormality.

## 2020-05-10 MED ORDER — ONDANSETRON HCL 4 MG PO TABS
4.0000 mg | ORAL_TABLET | Freq: Four times a day (QID) | ORAL | Status: DC | PRN
  Filled 2020-05-10: qty 1

## 2020-05-10 MED ORDER — LACTATED RINGERS IV SOLN: INTRAVENOUS | Status: AC

## 2020-05-10 MED ORDER — HYDROMORPHONE HCL 1 MG/ML IJ SOLN
0.5000 mg | INTRAMUSCULAR | Status: DC | PRN
  Administered 2020-05-11: 1 mg via INTRAVENOUS
  Administered 2020-05-11: 0.5 mg via INTRAVENOUS
  Administered 2020-05-11 – 2020-05-14 (×18): 1 mg via INTRAVENOUS
  Filled 2020-05-10 (×20): qty 1

## 2020-05-10 MED ORDER — ACETAMINOPHEN 325 MG PO TABS
650.0000 mg | ORAL_TABLET | Freq: Four times a day (QID) | ORAL | Status: DC | PRN
  Administered 2020-05-11 – 2020-06-29 (×16): 650 mg via ORAL
  Filled 2020-05-10 (×18): qty 2

## 2020-05-10 MED ORDER — LACTATED RINGERS IV BOLUS (SEPSIS)
1000.0000 mL | Freq: Once | INTRAVENOUS | Status: AC
  Administered 2020-05-10: 1000 mL via INTRAVENOUS

## 2020-05-10 MED ORDER — ONDANSETRON HCL 4 MG/2ML IJ SOLN
4.0000 mg | Freq: Four times a day (QID) | INTRAMUSCULAR | Status: DC | PRN
  Administered 2020-05-26 – 2020-06-04 (×2): 4 mg via INTRAVENOUS
  Filled 2020-05-10 (×3): qty 2

## 2020-05-10 MED ORDER — VANCOMYCIN VARIABLE DOSE PER UNSTABLE RENAL FUNCTION (PHARMACIST DOSING): Status: DC

## 2020-05-10 MED ORDER — HEPARIN SODIUM (PORCINE) 5000 UNIT/ML IJ SOLN
5000.0000 [IU] | Freq: Three times a day (TID) | INTRAMUSCULAR | Status: DC
  Administered 2020-05-11 – 2020-05-27 (×44): 5000 [IU] via SUBCUTANEOUS
  Filled 2020-05-10 (×48): qty 1

## 2020-05-10 MED ORDER — MORPHINE SULFATE (PF) 4 MG/ML IV SOLN
4.0000 mg | Freq: Once | INTRAVENOUS | Status: AC
Start: 2020-05-10 — End: 2020-05-10
  Administered 2020-05-10: 4 mg via INTRAVENOUS
  Filled 2020-05-10: qty 1

## 2020-05-10 MED ORDER — SODIUM CHLORIDE 0.9 % IV SOLN
2.0000 g | Freq: Once | INTRAVENOUS | Status: AC
  Administered 2020-05-10: 2 g via INTRAVENOUS
  Filled 2020-05-10: qty 20

## 2020-05-10 MED ORDER — VANCOMYCIN HCL IN DEXTROSE 1-5 GM/200ML-% IV SOLN
1000.0000 mg | Freq: Once | INTRAVENOUS | Status: AC
  Administered 2020-05-10: 1000 mg via INTRAVENOUS
  Filled 2020-05-10: qty 200

## 2020-05-10 MED ORDER — SODIUM CHLORIDE 0.9 % IV SOLN: 2.0000 g | INTRAVENOUS | Status: DC

## 2020-05-10 MED ORDER — ACETAMINOPHEN 650 MG RE SUPP: 650.0000 mg | Freq: Four times a day (QID) | RECTAL | Status: DC | PRN
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
## 2020-05-10 NOTE — ED Notes (Signed)
Pt said he is not getting up for the rest of the night because he is tired.

## 2020-05-10 NOTE — ED Notes (Signed)
Called MRI and left message at [phone]

## 2020-05-10 NOTE — ED Triage Notes (Signed)
Patient bib gems reports left shoulder pain and left knee pain. Pain rated 10/10. Ems states patient was evaluated yesterday but refused treatment. Patient's left leg is swollen twice the size of right leg, patient states his leg is not the problem. Patient states that his shoulder is the problem. Patient states that he will walk out of the door. Patient appears to have slowed speech and delayed responses. Patient denies alcohol or drug usage today.  VS WNL w EMS
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
## 2020-05-10 NOTE — Progress Notes (Signed)
A consult was received from an ED physician for vancomycin per pharmacy dosing (for an indication other than meningitis). The patient's profile has been reviewed for ht/wt/allergies/indication/available labs. A one time order has been placed for the above antibiotics.  Further antibiotics/pharmacy consults should be ordered by admitting physician if indicated.                       Bernadene Person, PharmD, BCPS [phone] 05/10/2020, 6:48 PM

## 2020-05-11 ENCOUNTER — Inpatient Hospital Stay (HOSPITAL_COMMUNITY): Payer: Self-pay

## 2020-05-11 ENCOUNTER — Other Ambulatory Visit: Payer: Self-pay

## 2020-05-11 ENCOUNTER — Encounter (HOSPITAL_COMMUNITY): Payer: Self-pay | Admitting: Family Medicine

## 2020-05-11 DIAGNOSIS — R7881 Bacteremia: Secondary | ICD-10-CM

## 2020-05-11 DIAGNOSIS — M7989 Other specified soft tissue disorders: Secondary | ICD-10-CM

## 2020-05-11 DIAGNOSIS — M009 Pyogenic arthritis, unspecified: Secondary | ICD-10-CM

## 2020-05-11 DIAGNOSIS — F1990 Other psychoactive substance use, unspecified, uncomplicated: Secondary | ICD-10-CM

## 2020-05-11 DIAGNOSIS — B9562 Methicillin resistant Staphylococcus aureus infection as the cause of diseases classified elsewhere: Secondary | ICD-10-CM

## 2020-05-11 HISTORY — DX: Pyogenic arthritis, unspecified: M00.9

## 2020-05-11 HISTORY — DX: Methicillin resistant Staphylococcus aureus infection as the cause of diseases classified elsewhere: B95.62

## 2020-05-11 HISTORY — DX: Other psychoactive substance use, unspecified, uncomplicated: F19.90

## 2020-05-11 HISTORY — DX: Bacteremia: R78.81

## 2020-05-11 LAB — HEPATITIS PANEL, ACUTE
HCV Ab: REACTIVE — AB
Hep A IgM: NONREACTIVE
Hep B C IgM: NONREACTIVE
Hepatitis B Surface Ag: NONREACTIVE

## 2020-05-11 LAB — CBC
HCT: 33.3 % — ABNORMAL LOW (ref 39.0–52.0)
Hemoglobin: 11.4 g/dL — ABNORMAL LOW (ref 13.0–17.0)
MCH: 29 pg (ref 26.0–34.0)
MCHC: 34.2 g/dL (ref 30.0–36.0)
MCV: 84.7 fL (ref 80.0–100.0)
Platelets: 406 10*3/uL — ABNORMAL HIGH (ref 150–400)
RBC: 3.93 MIL/uL — ABNORMAL LOW (ref 4.22–5.81)
RDW: 12.9 % (ref 11.5–15.5)
WBC: 17.5 10*3/uL — ABNORMAL HIGH (ref 4.0–10.5)
nRBC: 0 % (ref 0.0–0.2)

## 2020-05-11 LAB — BLOOD CULTURE ID PANEL (REFLEXED) - BCID2

## 2020-05-11 LAB — BASIC METABOLIC PANEL
Anion gap: 17 — ABNORMAL HIGH (ref 5–15)
BUN: 95 mg/dL — ABNORMAL HIGH (ref 6–20)
CO2: 17 mmol/L — ABNORMAL LOW (ref 22–32)
Calcium: 7.8 mg/dL — ABNORMAL LOW (ref 8.9–10.3)
Chloride: 95 mmol/L — ABNORMAL LOW (ref 98–111)
Creatinine, Ser: 8.31 mg/dL — ABNORMAL HIGH (ref 0.61–1.24)
GFR calc Af Amer: 9 mL/min — ABNORMAL LOW (ref >60)
GFR calc non Af Amer: 8 mL/min — ABNORMAL LOW (ref >60)
Glucose, Bld: 94 mg/dL (ref 70–99)
Potassium: 4.4 mmol/L (ref 3.5–5.1)
Sodium: 129 mmol/L — ABNORMAL LOW (ref 135–145)

## 2020-05-11 LAB — HIV ANTIBODY (ROUTINE TESTING W REFLEX): HIV Screen 4th Generation wRfx: NONREACTIVE
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
## 2020-05-11 IMAGING — US US RENAL
1 series · 14 of 25 positions shown · non-contrast
Comparison: [DATE]

CLINICAL DATA: Renal failure

EXAM:
RENAL / URINARY TRACT ULTRASOUND COMPLETE

[Series 1: us renal · 14 of 28 slices shown]
[im 1/28]
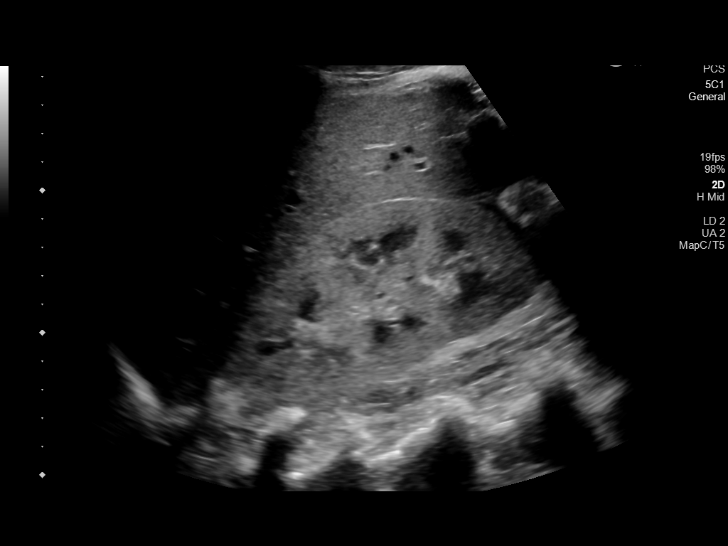
[im 3/28]
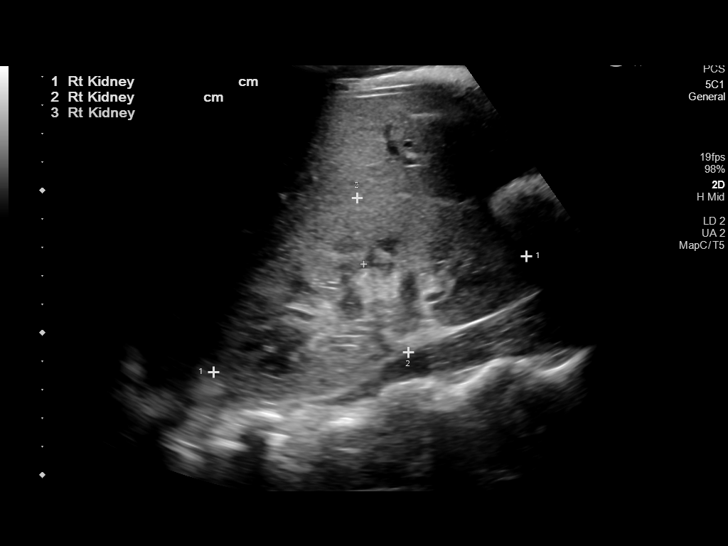
[im 5/28]
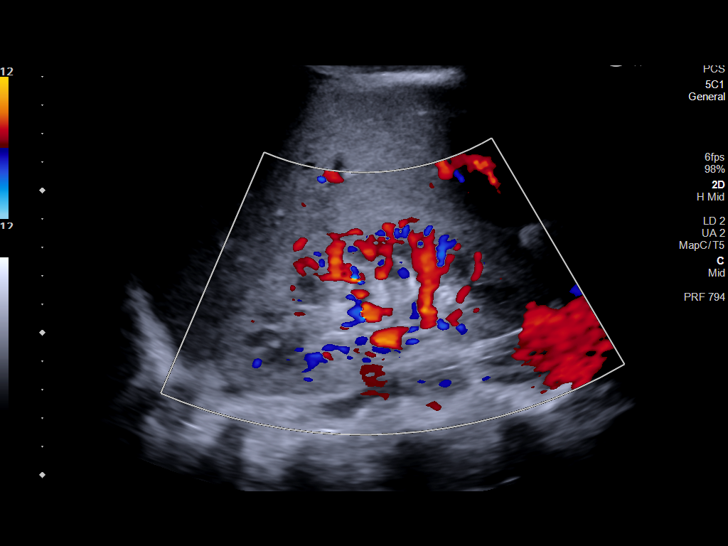
[im 7/28]
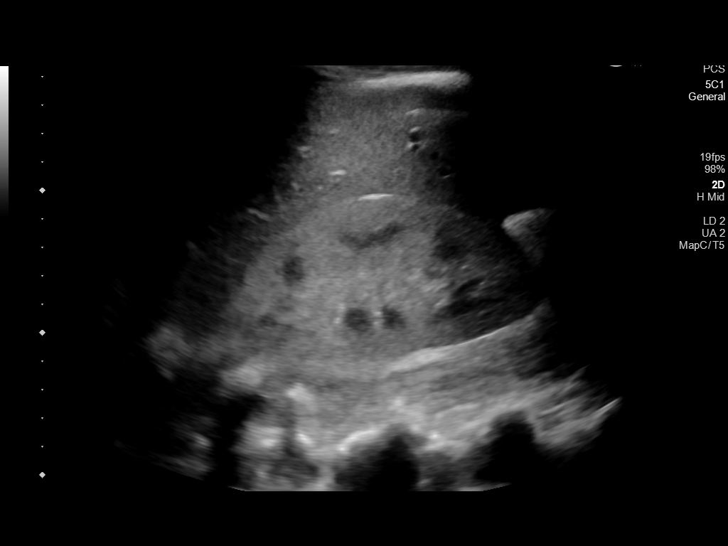
[im 10/28]
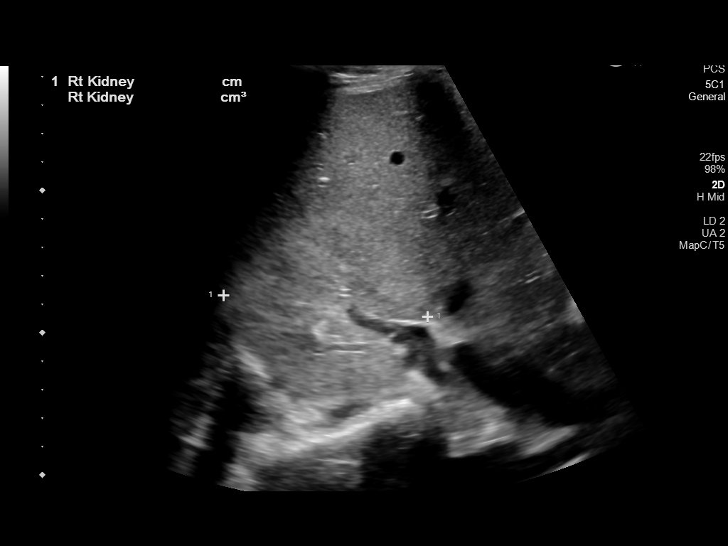
[im 11/28]
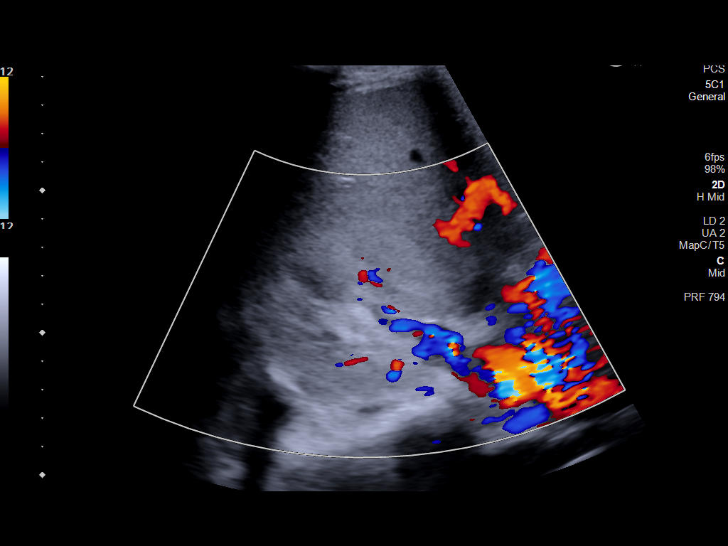
[im 13/28]
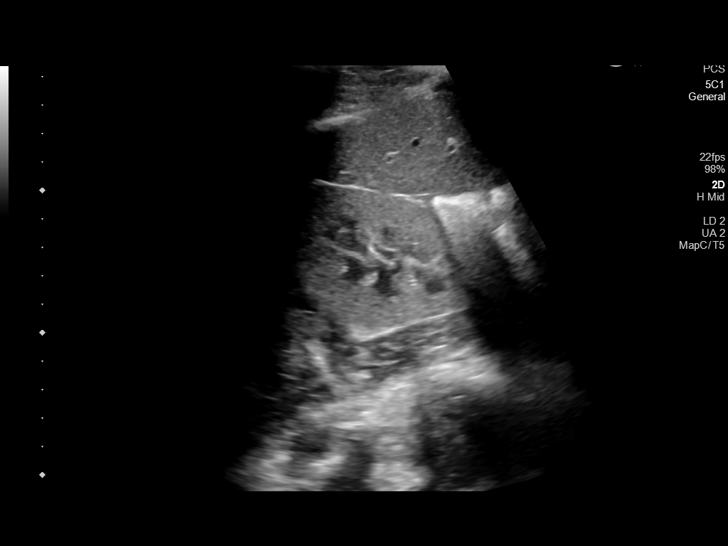
[im 15/28]
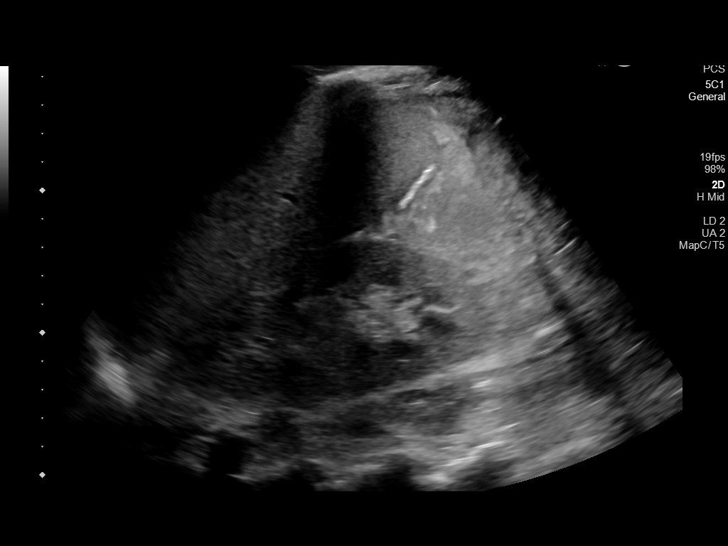
[im 17/28]
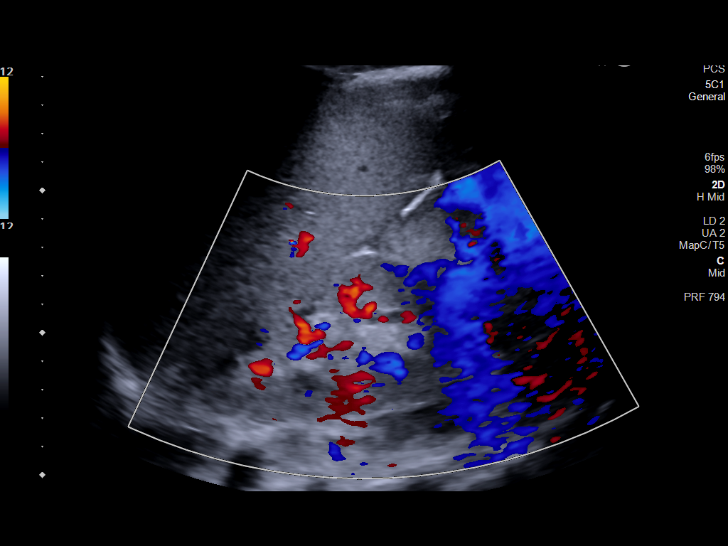
[im 19/28]
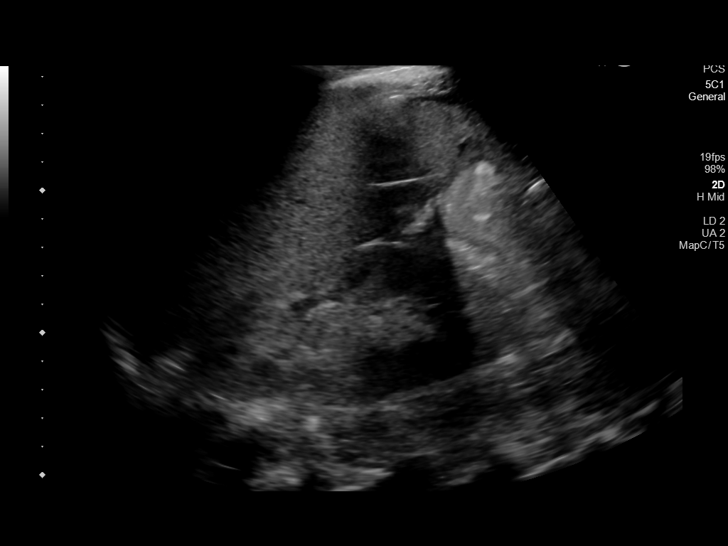
[im 21/28]
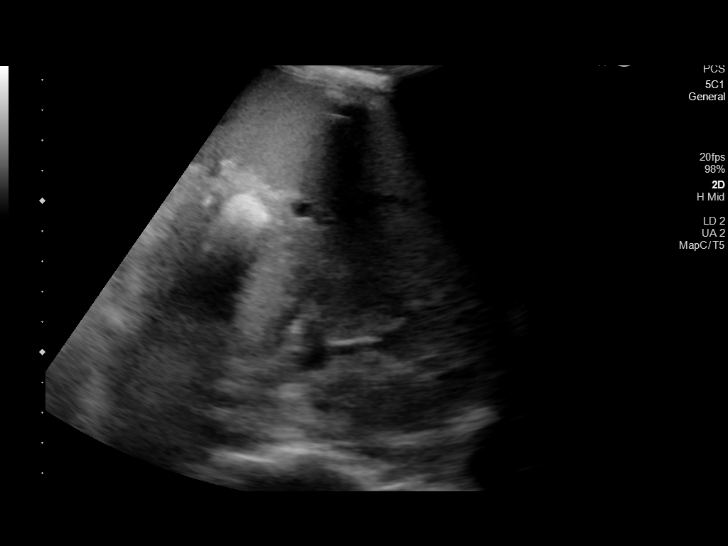
[im 23/28]
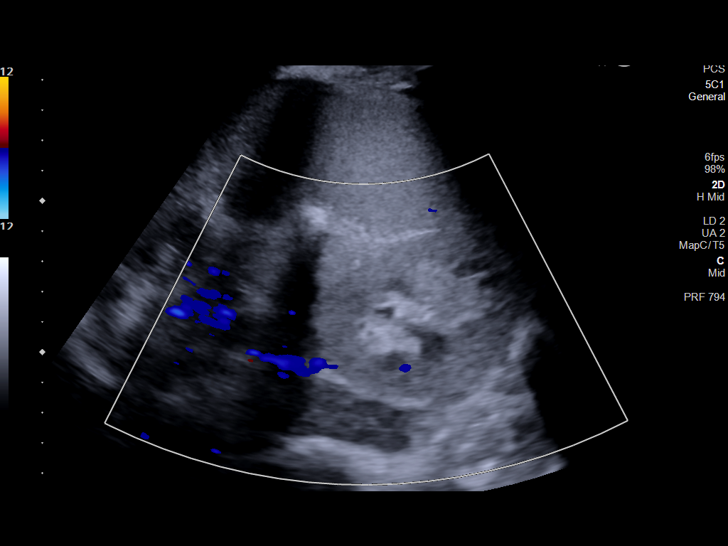
[im 25/28]
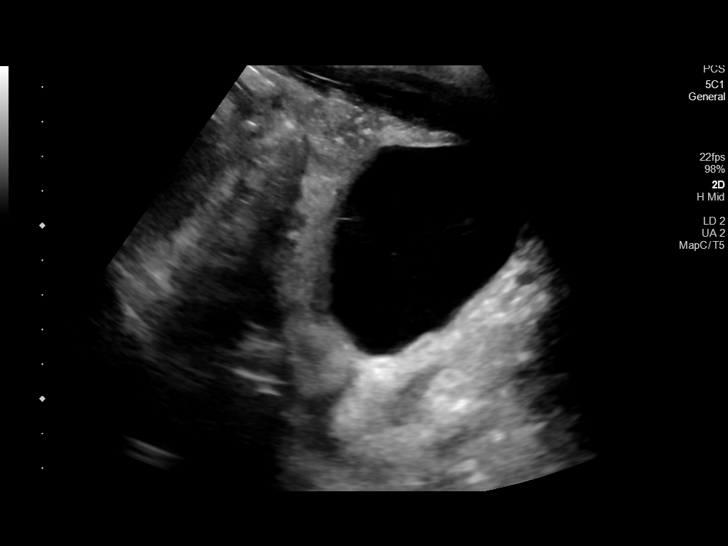
[im 28/28]
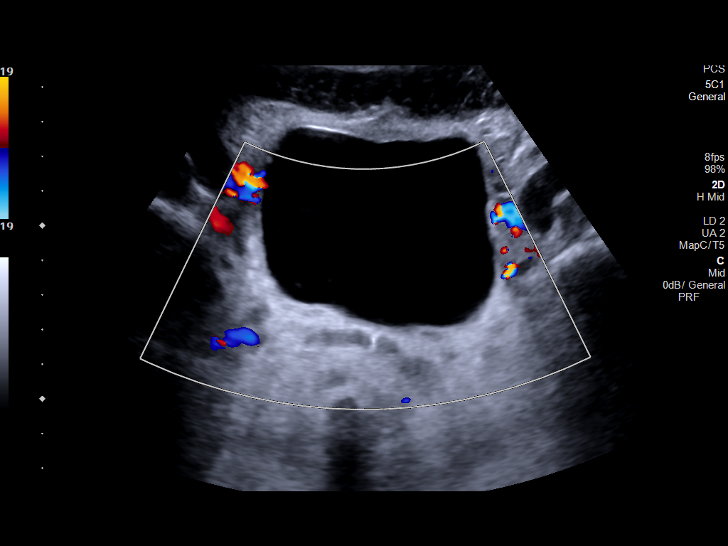

[14 of 25 positions shown; findings below may reference images not displayed]

FINDINGS: Right Kidney:

Renal measurements: 11.7 x 5.7 x 7.2 cm = volume: 252.6 mL. Diffuse
increased renal echotexture consistent with medical renal disease.
No mass or hydronephrosis.

Left Kidney:

Renal measurements: 11.6 x 5.5 x 6.7 cm = volume: 222.9 mL. Diffuse
increased renal echotexture consistent with medical renal disease.
No mass or hydronephrosis.

Bladder:

Appears normal for degree of bladder distention.

Other:

None.
IMPRESSION: 1. Increased renal cortical echotexture bilaterally, compatible with
medical renal disease.

## 2020-05-11 IMAGING — MR MR KNEE*L* W/O CM
4 of 6 series · 10 of 40 positions shown · non-contrast
Comparison: Radiograph same day

CLINICAL DATA: Septic arthritis, knee pain and swelling

EXAM:
MRI OF THE LEFT KNEE WITHOUT CONTRAST
TECHNIQUE: Multiplanar, multisequence MR imaging of the knee was performed. No
intravenous contrast was administered.

[Series 3: T2 fat-sat · axial · 4.0mm · 0.31mm/px · z∈[-76,+44]mm · 3 of 34 slices shown (1 of 2)]
[im 5/34]
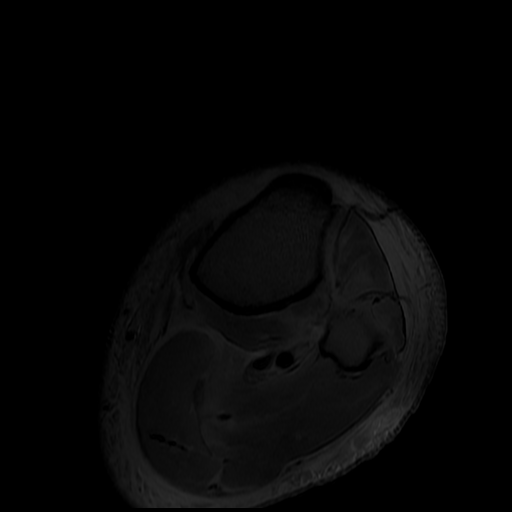
[im 19/34]
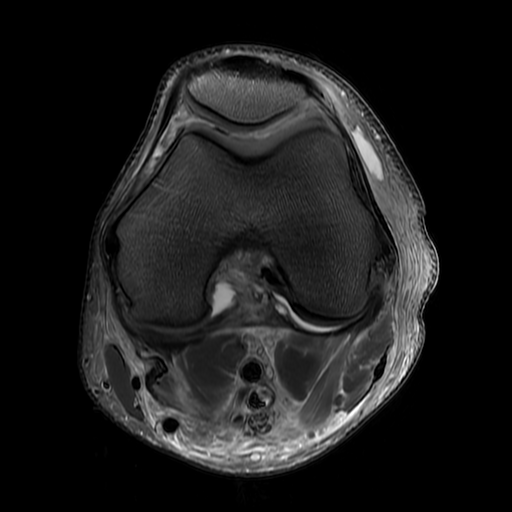
[im 29/34]
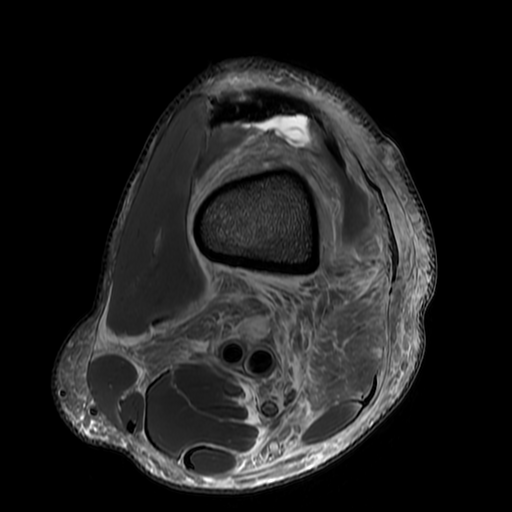

[Series 5: PD fat-sat · coronal · 4.0mm · 0.16mm/px · 3 of 30 slices shown (1 of 2)]
[im 6/30]
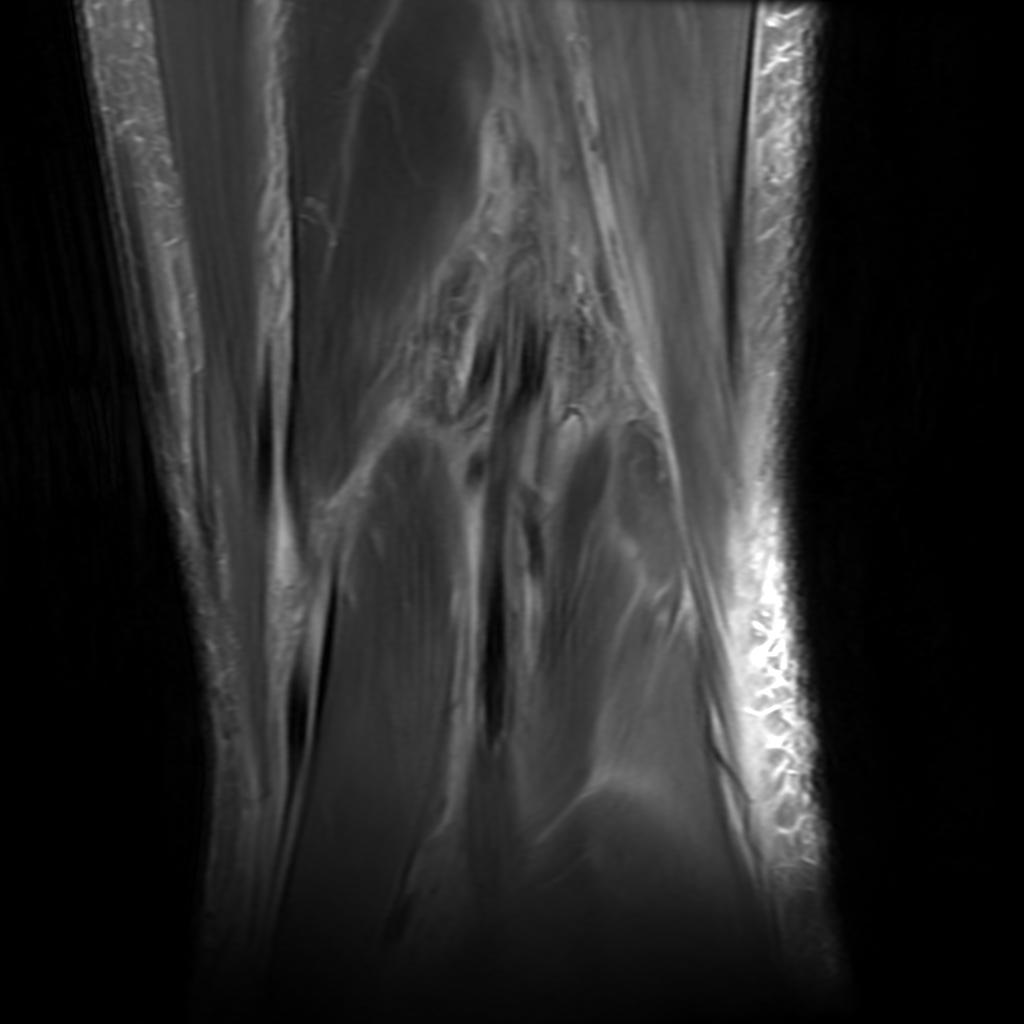
[im 18/30]
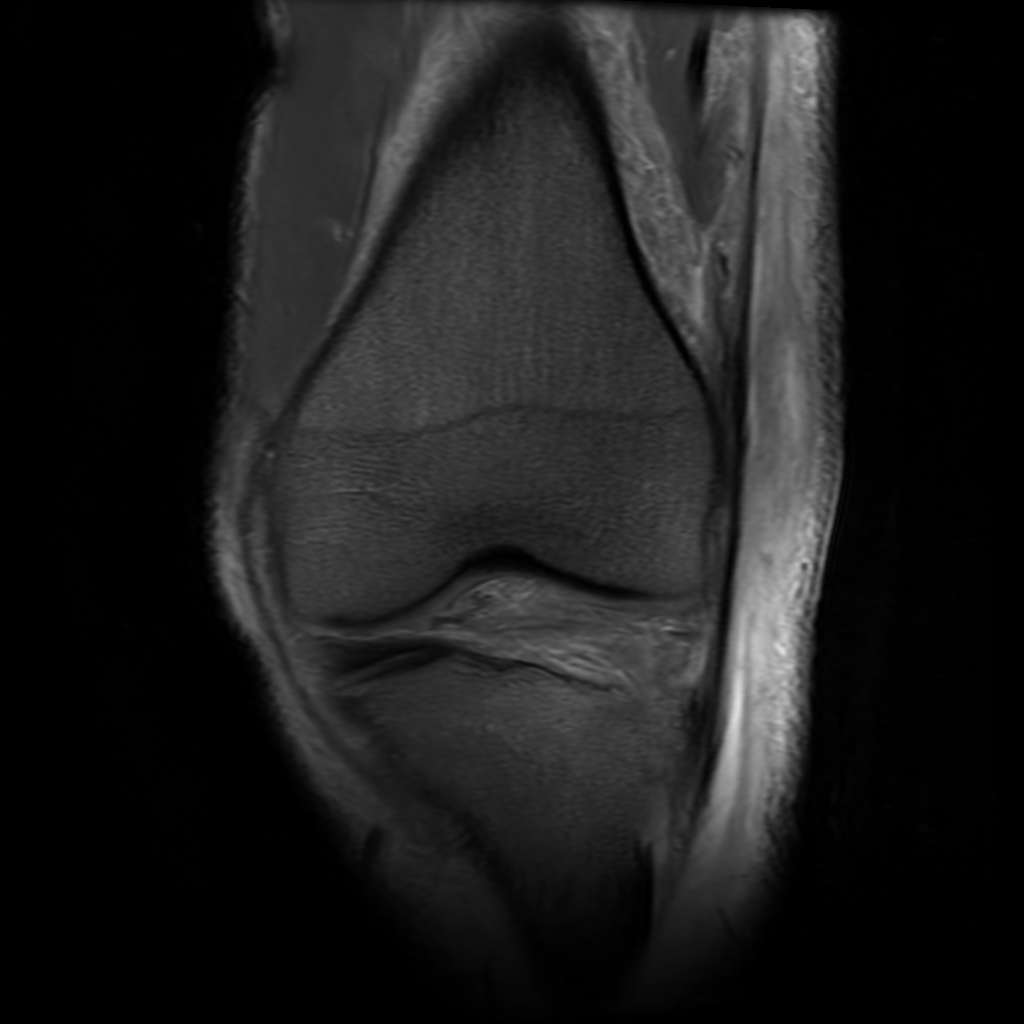
[im 30/30]
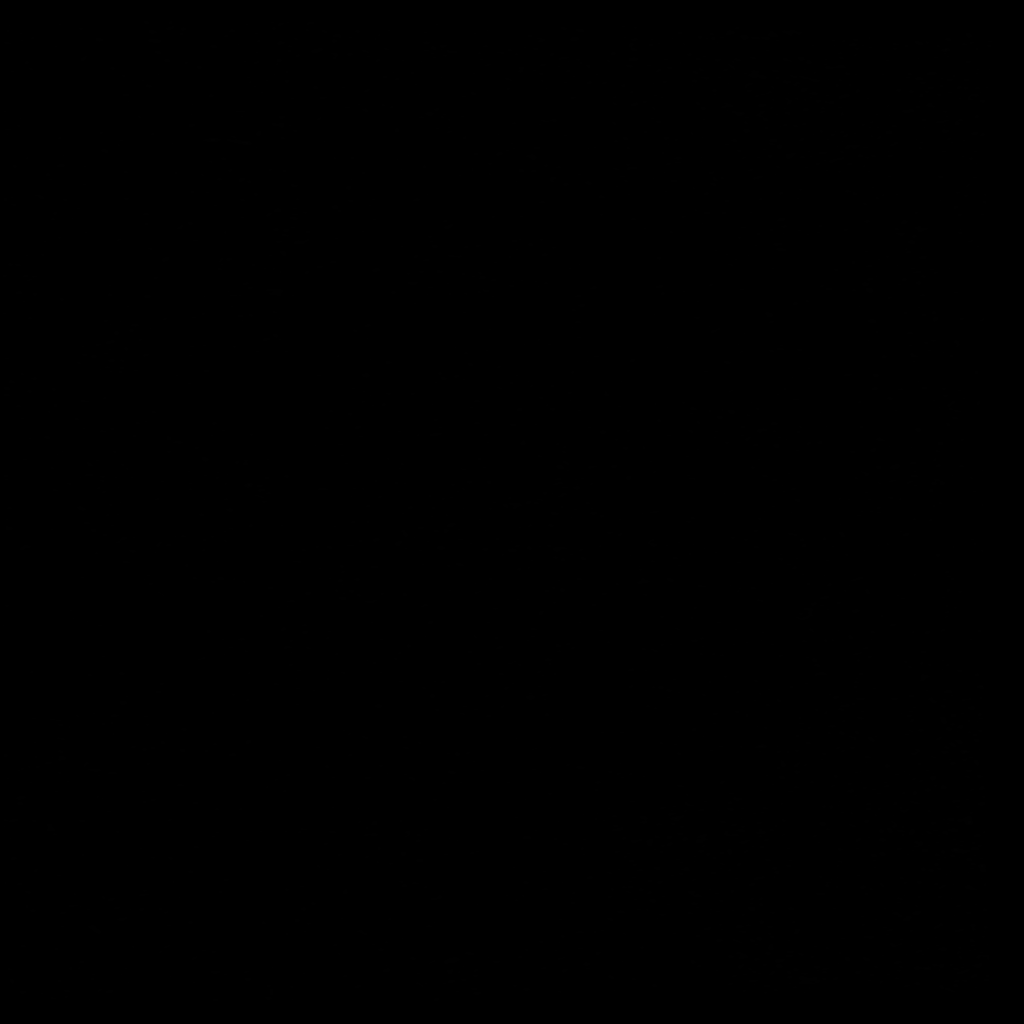

[Series 6: PD fat-sat · sagittal · 3.0mm · 0.31mm/px · 3 of 33 slices shown (2 of 2)]
[im 6/33]
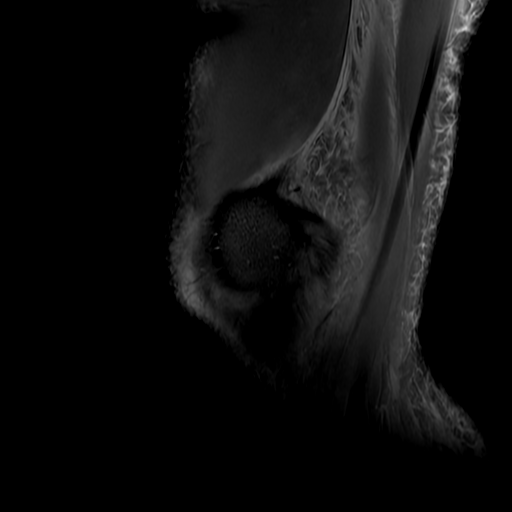
[im 17/33]
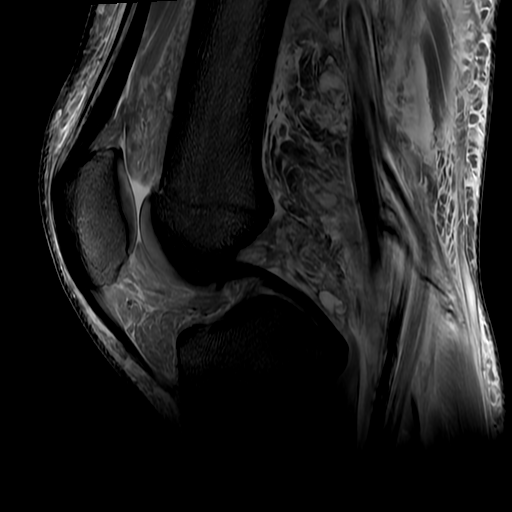
[im 27/33]
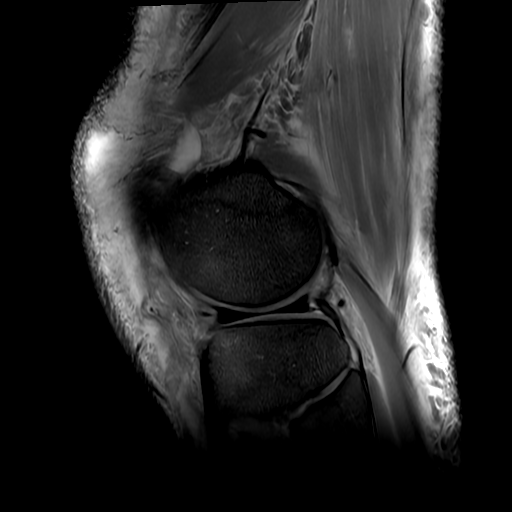

[Series 7: T2 fat-sat · sagittal · 3.0mm · 0.31mm/px · 1 of 33 slices shown (2 of 2)]
[im 6/33]
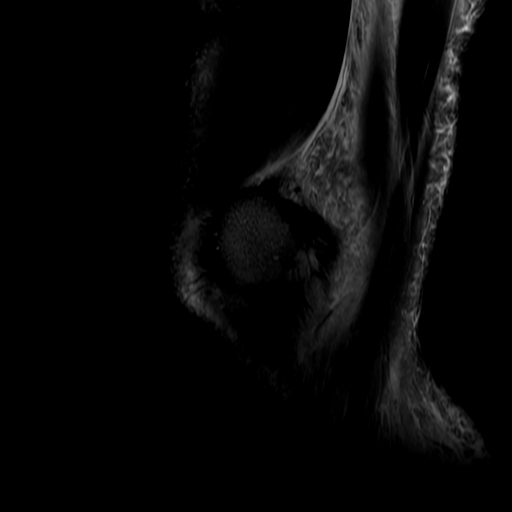

[10 of 40 positions shown; findings below may reference images not displayed]

FINDINGS: MENISCI

Medial: Intact.

Lateral: There is a nondisplaced horizontal tear of the anterior
horn of the lateral meniscus extending to the mid body.

LIGAMENTS

Cruciates: The ACL is intact. The PCL is intact.

Collaterals: The MCL is intact. There is mildly increased signal and
thickening seen around the iliotibial tract with diffuse surrounding
subcutaneous edema.

CARTILAGE

Patellofemoral: Normal.

Medial compartment: Normal.

Lateral compartment: Normal.

BONES: No fracture. No avascular necrosis. Increased marrow signal
seen within the anterolateral tibial plateau without associated T1
hypointensity.

JOINT: There is a small to moderate knee joint effusion present.
Diffuse edema seen within the JIM. No plical thickening.

EXTENSOR MECHANISM: The patellar and quadriceps tendon are intact.
The retinaculum is unremarkable.

POPLITEAL FOSSA: No popliteal cyst.

OTHER: The there is diffuse subcutaneous edema seen surrounding the
posterior muscular compartment. Feathery signal is seen throughout
the muscles posterior to the knee most notable within the biceps
femoris musculature. There is also diffuse subcutaneous edema seen
along the lateral aspect of the knee. There are small multiple
multilocular cystic collection seen along the anterolateral aspect
of the knee within the subcutaneous tissues, the largest along the
anterior proximal tibia measures 1.4 by 0.4 x 4.3 cm in dimension.
There is also a small multilocular cystic collection seen beneath
the lateral patellofemoral ligament which measures 1.1 x 0.4 x
cm in dimension.
IMPRESSION: 1. Nondisplaced tear of the anterolateral meniscus.
2. Intact cruciate ligaments
3. Findings suggestive of diffuse cellulitis along the posterior and
lateral aspect of the knee with small multilocular cystic collection
along the lateral aspect of the knee as well as beneath the lateral
patellofemoral ligament which could represent small abscesses.
4. Small to moderate knee joint effusion without definite evidence
of synovitis.
5. Increased signal in the anterolateral tibial plateau which could
be due to reactive marrow or osseous contusion.

## 2020-05-11 MED ORDER — SENNOSIDES-DOCUSATE SODIUM 8.6-50 MG PO TABS
2.0000 | ORAL_TABLET | Freq: Two times a day (BID) | ORAL | Status: DC
  Administered 2020-05-11 – 2020-07-02 (×91): 2 via ORAL
  Filled 2020-05-11 (×99): qty 2

## 2020-05-11 MED ORDER — OXYCODONE HCL 5 MG PO TABS
5.0000 mg | ORAL_TABLET | Freq: Four times a day (QID) | ORAL | Status: DC | PRN
  Administered 2020-05-11 – 2020-05-13 (×2): 5 mg via ORAL
  Filled 2020-05-11 (×2): qty 1

## 2020-05-11 MED ORDER — LINEZOLID 600 MG/300ML IV SOLN
600.0000 mg | Freq: Two times a day (BID) | INTRAVENOUS | Status: DC
  Administered 2020-05-11 – 2020-05-13 (×5): 600 mg via INTRAVENOUS
  Filled 2020-05-11 (×6): qty 300

## 2020-05-11 MED ORDER — SODIUM CHLORIDE 0.9 % IV SOLN: INTRAVENOUS | Status: AC

## 2020-05-11 NOTE — Progress Notes (Signed)
New Admission Note:  Arrival Method: arrived via carelink from   Mental Orientation: A & O x4 Telemetry: n/a Assessment: Completed Skin: pt has several of healed and open scabs all over his body, along with bug bites on both his legs, R knee is leaking yellow pus, L knee has a large wound that is scabbing over. Patient has 2 black eyes and left eye is blood shot red.  IV: R arm w/ LR running  Pain: 8/10- pt provided pain medicine  Safety Measures: Safety Fall Prevention Plan was given, discussed. 3I14: Patient has been orientated to the room, unit and the staff.  No family at the bedside, pt states that he is currently homeless.     Orders have been reviewed and implemented. Will continue to monitor the patient. Call light has been placed within reach and bed alarm has been activated.   Lawernce Ion ,RN
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
## 2020-05-11 NOTE — ED Notes (Addendum)
Attempted to call report. The receiving nurse was off of the unit and writer will call back in 10 minutes.

## 2020-05-11 NOTE — ED Notes (Signed)
Attempted to call report. Was informed by %M secretary that another nurse than what she thought previously would be getting this patient. That nurse was still unavailable. Informed that the  Receiving nurse to call for report.

## 2020-05-11 NOTE — Progress Notes (Signed)
Pt was resting when I arrived and did not answer when I called his name. According to the electronic request he wanted a Bible and to speak with a Chaplain.  I placed the Bible on his table. Please have him page Chaplain, here or at [facility], when he is available to talk. Chaplain Elmarie Shiley, MDiv   05/11/20 1500  Clinical Encounter Type  Visited With Patient

## 2020-05-11 NOTE — ED Notes (Signed)
Patient found with his IV disconnected and a puddle of IV fluids under the bed. Patient reconnected to fluids with new tubing. Encouraged patient to please leave the fluids intact.
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
## 2020-05-11 NOTE — Progress Notes (Signed)
PHARMACY - PHYSICIAN COMMUNICATION CRITICAL VALUE ALERT - BLOOD CULTURE IDENTIFICATION (BCID)  [name] is an 29 y.o. male who presented to [facility] on 05/10/2020 with a chief complaint of cellulitis  Assessment:  1/4 BCx bottles growing MRSA (suspected source cellulitis vs IVDU)  Name of physician (or Provider) Contacted: Margo Aye  Current antibiotics: Zyvox, Rocephin  Changes to prescribed antibiotics recommended: could potentially stop Rocephin as MRSA likely cause of soft tissue infxn, but ID will see tomorrow d/t MRSA in blood, so will defer narrowing to them Patient is on recommended antibiotics - No changes needed  Results for orders placed or performed during the hospital encounter of 05/10/20  Blood Culture ID Panel (Reflexed) (Collected: 05/10/2020  6:43 PM)  Result Value Ref Range   Enterococcus faecalis NOT DETECTED NOT DETECTED   Enterococcus Faecium NOT DETECTED NOT DETECTED   Listeria monocytogenes NOT DETECTED NOT DETECTED   Staphylococcus species DETECTED (A) NOT DETECTED   Staphylococcus aureus (BCID) DETECTED (A) NOT DETECTED   Staphylococcus epidermidis NOT DETECTED NOT DETECTED   Staphylococcus lugdunensis NOT DETECTED NOT DETECTED   Streptococcus species NOT DETECTED NOT DETECTED   Streptococcus agalactiae NOT DETECTED NOT DETECTED   Streptococcus pneumoniae NOT DETECTED NOT DETECTED   Streptococcus pyogenes NOT DETECTED NOT DETECTED   A.calcoaceticus-baumannii NOT DETECTED NOT DETECTED   Bacteroides fragilis NOT DETECTED NOT DETECTED   Enterobacterales NOT DETECTED NOT DETECTED   Enterobacter cloacae complex NOT DETECTED NOT DETECTED   Escherichia coli NOT DETECTED NOT DETECTED   Klebsiella aerogenes NOT DETECTED NOT DETECTED   Klebsiella oxytoca NOT DETECTED NOT DETECTED   Klebsiella pneumoniae NOT DETECTED NOT DETECTED   Proteus species NOT DETECTED NOT DETECTED   Salmonella species NOT DETECTED NOT DETECTED   Serratia marcescens NOT DETECTED NOT  DETECTED   Haemophilus influenzae NOT DETECTED NOT DETECTED   Neisseria meningitidis NOT DETECTED NOT DETECTED   Pseudomonas aeruginosa NOT DETECTED NOT DETECTED   Stenotrophomonas maltophilia NOT DETECTED NOT DETECTED   Candida albicans NOT DETECTED NOT DETECTED   Candida auris NOT DETECTED NOT DETECTED   Candida glabrata NOT DETECTED NOT DETECTED   Candida krusei NOT DETECTED NOT DETECTED   Candida parapsilosis NOT DETECTED NOT DETECTED   Candida tropicalis NOT DETECTED NOT DETECTED   Cryptococcus neoformans/gattii NOT DETECTED NOT DETECTED   Meth resistant mecA/C and MREJ DETECTED (A) NOT DETECTED    Constance Hackenberg A 05/11/2020  3:38 PM

## 2020-05-11 NOTE — Progress Notes (Signed)
Left lower extremity venous duplex has been completed. Preliminary results can be found in CV Proc through chart review.   05/11/20 9:38 AM Olen Cordial RVT

## 2020-05-11 NOTE — ED Notes (Signed)
Writer atempted to call report. Receiving nurse not available. Receiving nurse to call back for report.

## 2020-05-11 NOTE — ED Notes (Signed)
Pt has been cursing and rude to staff for the duration of the night. Pt is upset because he is hungry and he hasn't "eaten anything in a week". Door has been closed and rounding performed because he yells out curse words randomly and intermittently. When asked to stop he argues and states "just let me swear when I am pissed off".

## 2020-05-11 NOTE — Progress Notes (Signed)
[facility] Progress Note   Subjective:   No new complaints.  No difficulty voiding.  In for transfer to 59M at [facility].   Objective Vitals:   05/11/20 0419 05/11/20 0430 05/11/20 0530 05/11/20 0758  BP:  103/75 121/79 127/78  Pulse: 69 (!) 59 80 78  Resp:  16 20 18   Temp:    97.8 F (36.6 C)  TempSrc:    Oral  SpO2: 100% 99% 100% 100%  Weight:      Height:       Physical Exam Gen: chronically ill, comfortable appearing  Eyes: periorbital ecchymoses BL, L > R, EOMI ENT: poor dentition, MM tacky Neck: supple CV:  RRR Abd:  Soft, nontender Lungs: clear ant GU: no foley, bladder not palpable Extr:  LLE ++ edema, erythema Neuro: oriented and conversant but a bit lethargic and poor historian on details Skin: open wounds scattered, RLE has several and erythema confluent nearly from foot to knee.  Additional Objective Labs: Basic Metabolic Panel: Recent Labs  Lab 05/10/20 1843 05/10/20 1937 05/11/20 0425  NA 128* 129* 129*  K 4.6 4.6 4.4  CL 92* 96* 95*  CO2 19*  --  17*  GLUCOSE 116* 100* 94  BUN 91* 104* 95*  CREATININE 10.19* 10.70* 8.31*  CALCIUM 8.0*  --  7.8*   Liver Function Tests: Recent Labs  Lab 05/10/20 1843  AST 25  ALT 26  ALKPHOS 60  BILITOT 0.3  PROT 6.6  ALBUMIN 2.2*   Recent Labs  Lab 05/10/20 1843  LIPASE 102*   CBC: Recent Labs  Lab 05/10/20 1843 05/10/20 1937 05/11/20 0425  WBC 17.6*  --  17.5*  NEUTROABS 14.9*  --   --   HGB 11.0* 10.9* 11.4*  HCT 31.4* 32.0* 33.3*  MCV 82.4  --  84.7  PLT 433*  --  406*   Blood Culture    Component Value Date/Time   SDES  05/10/2020 1843    BLOOD RIGHT ARM Performed at [facility], [address], [place], [place]    [facility]  05/10/2020 1843    BLOOD Blood Culture adequate volume Performed at [facility], [address], [place], [place]    [id number] POSITIVE COCCI 05/10/2020 1843   REPTSTATUS PENDING 05/10/2020 1843     Cardiac Enzymes: Recent Labs  Lab 05/10/20 1953  CKTOTAL 525*   CBG: No results for input(s): GLUCAP in the last 168 hours. Iron Studies: No results for input(s): IRON, TIBC, TRANSFERRIN, FERRITIN in the last 72 hours. @lablastinr3 @ Studies/Results: DG Knee 1-2 Views Left  Result Date: 05/10/2020 CLINICAL DATA:  Left knee pain and swelling EXAM: LEFT KNEE - 1-2 VIEW COMPARISON:  None. FINDINGS: Frontal and lateral views of the left knee demonstrate no fracture, subluxation, or dislocation. Joint spaces are well preserved. No joint effusion. There is mild prepatellar soft tissue edema. IMPRESSION: 1. Prepatellar soft tissue edema.  No acute bony abnormality. Electronically Signed   By: M.D.   On: 05/10/2020 21:34   CT Head Wo Contrast  Result Date: 05/10/2020 CLINICAL DATA:  Head trauma, bilateral periorbital ecchymosis EXAM: CT HEAD WITHOUT CONTRAST TECHNIQUE: Contiguous axial images were obtained from the base of the skull through the vertex without intravenous contrast. COMPARISON:  10/13/2015 FINDINGS: Brain: No acute infarct or hemorrhage. Lateral ventricles and midline structures are unremarkable. No acute extra-axial fluid collections. No mass effect. Vascular: No hyperdense vessel or unexpected calcification. Skull: Normal. Negative  for fracture or focal lesion. Sinuses/Orbits: No acute finding.  Chronic nasal bone fracture. Other: None. IMPRESSION: 1. No acute intracranial process. Electronically Signed   By: Sharlet Salina M.D.   On: 05/10/2020 19:25   US RENAL  Result Date: 05/11/2020 CLINICAL DATA:  Renal failure EXAM: RENAL / URINARY TRACT ULTRASOUND COMPLETE COMPARISON:  06/29/2014 FINDINGS: Right Kidney: Renal measurements: 11.7 x 5.7 x 7.2 cm = volume: 252.6 mL. Diffuse increased renal echotexture consistent with medical renal disease. No mass or hydronephrosis. Left Kidney: Renal measurements: 11.6 x 5.5 x 6.7 cm = volume: 222.9 mL. Diffuse increased renal  echotexture consistent with medical renal disease. No mass or hydronephrosis. Bladder: Appears normal for degree of bladder distention. Other: None. IMPRESSION: 1. Increased renal cortical echotexture bilaterally, compatible with medical renal disease. Electronically Signed   By: Sharlet Salina M.D.   On: 05/11/2020 02:04   DG Chest Port 1 View  Result Date: 05/10/2020 CLINICAL DATA:  Sepsis, left shoulder pain EXAM: PORTABLE CHEST 1 VIEW COMPARISON:  05/14/2014 FINDINGS: Single frontal view of the chest demonstrates an unremarkable cardiac silhouette. No airspace disease, effusion, or pneumothorax. No acute bony abnormalities. IMPRESSION: 1. No acute intrathoracic process. Electronically Signed   By: Sharlet Salina M.D.   On: 05/10/2020 19:21   DG Shoulder Left  Result Date: 05/10/2020 CLINICAL DATA:  Left shoulder pain EXAM: LEFT SHOULDER - 2+ VIEW COMPARISON:  None. FINDINGS: Frontal and transscapular views of the left shoulder demonstrate no fracture, subluxation, or dislocation. Joint spaces are well preserved. Left chest is clear. IMPRESSION: 1. Unremarkable left shoulder. Electronically Signed   By: Sharlet Salina M.D.   On: 05/10/2020 19:21   VAS [place] LOWER EXTREMITY VENOUS (DVT)  Result Date: 05/11/2020  Lower Venous DVT Study Indications: Pain, and Edema.  Risk Factors: None identified. Limitations: Poor ultrasound/tissue interface. Comparison Study: No prior studies. Performing Technologist: Chanda Busing RVT  Examination Guidelines: A complete evaluation includes B-mode imaging, spectral Doppler, color Doppler, and power Doppler as needed of all accessible portions of each vessel. Bilateral testing is considered an integral part of a complete examination. Limited examinations for reoccurring indications may be performed as noted. The reflux portion of the exam is performed with the patient in reverse Trendelenburg.  +-----+---------------+---------+-----------+----------+--------------+  RIGHTCompressibilityPhasicitySpontaneityPropertiesThrombus Aging +-----+---------------+---------+-----------+----------+--------------+ CFV  Full           Yes      Yes                                 +-----+---------------+---------+-----------+----------+--------------+   +---------+---------------+---------+-----------+----------+--------------+ LEFT     CompressibilityPhasicitySpontaneityPropertiesThrombus Aging +---------+---------------+---------+-----------+----------+--------------+ CFV      Full           Yes      Yes                                 +---------+---------------+---------+-----------+----------+--------------+ SFJ      Full                                                        +---------+---------------+---------+-----------+----------+--------------+ FV Prox  Full                                                        +---------+---------------+---------+-----------+----------+--------------+  FV Mid   Full                                                        +---------+---------------+---------+-----------+----------+--------------+ FV DistalFull                                                        +---------+---------------+---------+-----------+----------+--------------+ PFV      Full                                                        +---------+---------------+---------+-----------+----------+--------------+ POP      Full           Yes      Yes                                 +---------+---------------+---------+-----------+----------+--------------+ PTV      Full                                                        +---------+---------------+---------+-----------+----------+--------------+ PERO     Full                                                        +---------+---------------+---------+-----------+----------+--------------+     Summary: RIGHT: - No evidence of common femoral vein  obstruction.  LEFT: - There is no evidence of deep vein thrombosis in the lower extremity.  - No cystic structure found in the popliteal fossa.  *See table(s) above for measurements and observations. Electronically signed by Lemar Livings MD on 05/11/2020 at 11:02:30 AM.    Final    Medications: . cefTRIAXone (ROCEPHIN)  IV    . lactated ringers 150 mL/hr at 05/11/20 1136  . linezolid (ZYVOX) IV Stopped (05/11/20 1015)   . heparin  5,000 Units Subcutaneous Q8H    Dialysis Orders:  Assessment/Plan **AKI:  Severe AKI but no emergent indications for RRT.  Renal US echogenic kidneys, no obstruction,  UA 1+ protein, + blood (on microscopy too).  FeNa 0.8%.  CK 525.   Appears at least a component of prerenal - some improvement with hydration and FeNa low.  ? etiology of hematuria --> could certainly have GN from chronic infection but I wouldn't work up extenisvely at this time as he's not a candidate for any type of immunosuppression.  His echogenic kidneys are concerning for underlying CKD which would make any insult more difficult to recover from.  Las known Cr was 1 in 10/2018. --Being volume resuscitated by primary, agree --Will follow closely - may need RRT if doesn't improve with supportive care  **Sepsis:  Broad spectrum abx and cultures per primary.   GPC prelim blood cultures  **Hyponatremia:  Appears hypovolemic, follow with hydration.   **LLE edema:  duplex neg for clot.  **drug abuse: U tox opiates and amphetamines, encouraged quitting.   **AGMA: secondary to AKI, lactate normal,  Follow - on LR.   **Anemia, normocytic, mild: follow   Estill BakesLindsay Shivonne Schwartzman MD 05/11/2020, 2:24 PM  [facility] Pager: [phone]) [phone]

## 2020-05-11 NOTE — ED Notes (Signed)
[facility] called for transport. 

## 2020-05-12 ENCOUNTER — Inpatient Hospital Stay (HOSPITAL_COMMUNITY): Payer: Self-pay

## 2020-05-12 DIAGNOSIS — L03116 Cellulitis of left lower limb: Secondary | ICD-10-CM

## 2020-05-12 DIAGNOSIS — R7881 Bacteremia: Secondary | ICD-10-CM

## 2020-05-12 LAB — CBC WITH DIFFERENTIAL/PLATELET
Abs Immature Granulocytes: 0.44 10*3/uL — ABNORMAL HIGH (ref 0.00–0.07)
Basophils Absolute: 0 10*3/uL (ref 0.0–0.1)
Basophils Relative: 0 %
Eosinophils Absolute: 0.6 10*3/uL — ABNORMAL HIGH (ref 0.0–0.5)
Eosinophils Relative: 4 %
HCT: 33.2 % — ABNORMAL LOW (ref 39.0–52.0)
Hemoglobin: 11 g/dL — ABNORMAL LOW (ref 13.0–17.0)
Immature Granulocytes: 3 %
Lymphocytes Relative: 12 %
Lymphs Abs: 2 10*3/uL (ref 0.7–4.0)
MCH: 27.9 pg (ref 26.0–34.0)
MCHC: 33.1 g/dL (ref 30.0–36.0)
MCV: 84.3 fL (ref 80.0–100.0)
Monocytes Absolute: 1.4 10*3/uL — ABNORMAL HIGH (ref 0.1–1.0)
Monocytes Relative: 8 %
Neutro Abs: 12.3 10*3/uL — ABNORMAL HIGH (ref 1.7–7.7)
Neutrophils Relative %: 73 %
Platelets: 478 10*3/uL — ABNORMAL HIGH (ref 150–400)
RBC: 3.94 MIL/uL — ABNORMAL LOW (ref 4.22–5.81)
RDW: 12.9 % (ref 11.5–15.5)
WBC: 16.7 10*3/uL — ABNORMAL HIGH (ref 4.0–10.5)
nRBC: 0 % (ref 0.0–0.2)

## 2020-05-12 LAB — RENAL FUNCTION PANEL
Albumin: 1.4 g/dL — ABNORMAL LOW (ref 3.5–5.0)
Anion gap: 11 (ref 5–15)
BUN: 75 mg/dL — ABNORMAL HIGH (ref 6–20)
CO2: 20 mmol/L — ABNORMAL LOW (ref 22–32)
Calcium: 7.3 mg/dL — ABNORMAL LOW (ref 8.9–10.3)
Chloride: 99 mmol/L (ref 98–111)
Creatinine, Ser: 4.2 mg/dL — ABNORMAL HIGH (ref 0.61–1.24)
GFR calc Af Amer: 21 mL/min — ABNORMAL LOW (ref >60)
GFR calc non Af Amer: 18 mL/min — ABNORMAL LOW (ref >60)
Glucose, Bld: 107 mg/dL — ABNORMAL HIGH (ref 70–99)
Phosphorus: 3.9 mg/dL (ref 2.5–4.6)
Potassium: 4.6 mmol/L (ref 3.5–5.1)
Sodium: 130 mmol/L — ABNORMAL LOW (ref 135–145)

## 2020-05-12 LAB — ECHOCARDIOGRAM COMPLETE
Area-P 1/2: 5.97 cm2
Calc EF: 64.4 %
Height: 70 in
S' Lateral: 3.1 cm
Single Plane A2C EF: 67.1 %
Single Plane A4C EF: 61.2 %
Weight: 2497.37 oz

## 2020-05-12 LAB — URINE CULTURE: Culture: NO GROWTH

## 2020-05-12 LAB — C-REACTIVE PROTEIN: CRP: 10.5 mg/dL — ABNORMAL HIGH (ref <1.0)

## 2020-05-12 LAB — HEPATITIS A ANTIBODY, TOTAL: hep A Total Ab: REACTIVE — AB

## 2020-05-12 LAB — SEDIMENTATION RATE: Sed Rate: 40 mm/hr — ABNORMAL HIGH (ref 0–16)

## 2020-05-12 LAB — CK: Total CK: 104 U/L (ref 49–397)

## 2020-05-12 IMAGING — MR MR [PERSON_NAME] LOW WO/W CM*L*
4 of 10 series · 19 of 40 positions shown · IV contrast (YES GAD)
Comparison: X-ray left knee [DATE].  MRI left knee [DATE].

CLINICAL DATA: Soft tissue infection suspected, lower leg x-ray.

EXAM:
MRI OF LOWER LEFT EXTREMITY WITHOUT AND WITH CONTRAST
TECHNIQUE: Multiplanar, multisequence MR imaging of the left lower extremity
was performed both before and after administration of intravenous
contrast.
CONTRAST:  7mL GADAVIST GADOBUTROL 1 MMOL/ML IV SOLN

[Series 3: T1 · coronal · 4.0mm · 0.82mm/px · 1 of 27 slices shown (1 of 2)]
[im 1/27]
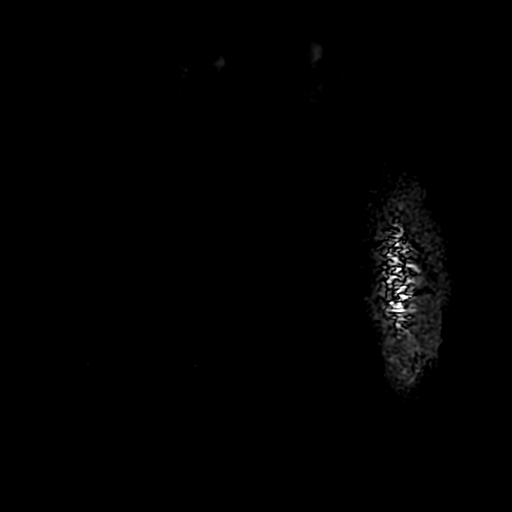

[Series 5: T1 · axial · 4.0mm · 0.47mm/px · z∈[-234,+180]mm · 7 of 84 slices shown (2 of 2)]
[im 1/84]
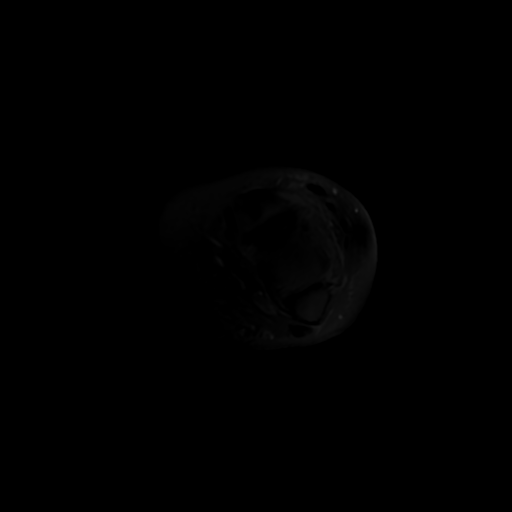
[im 14/84]
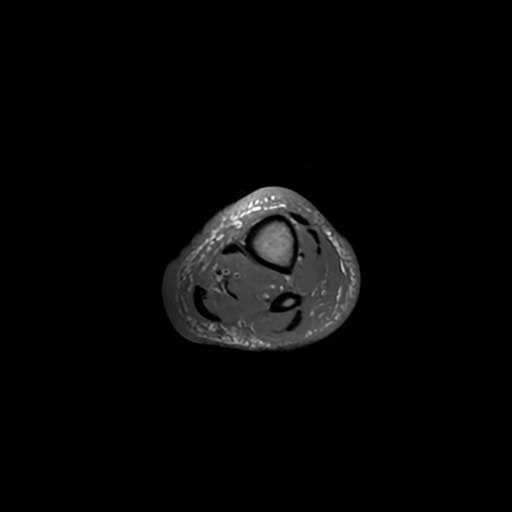
[im 28/84]
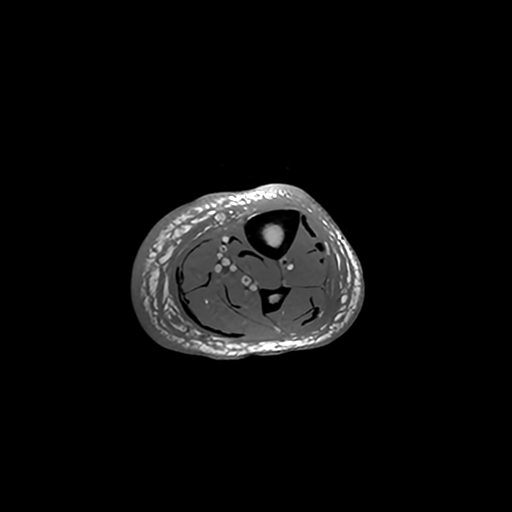
[im 42/84]
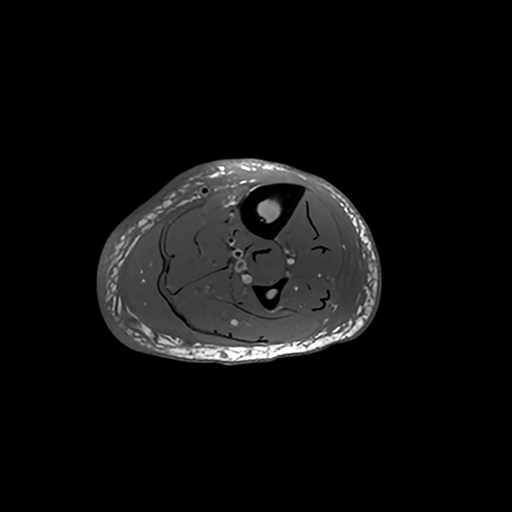
[im 56/84]
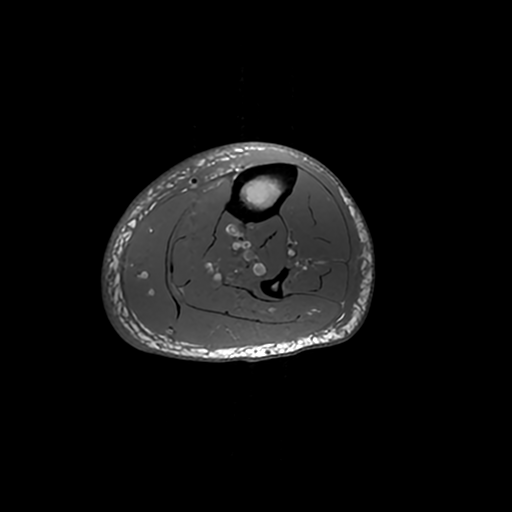
[im 70/84]
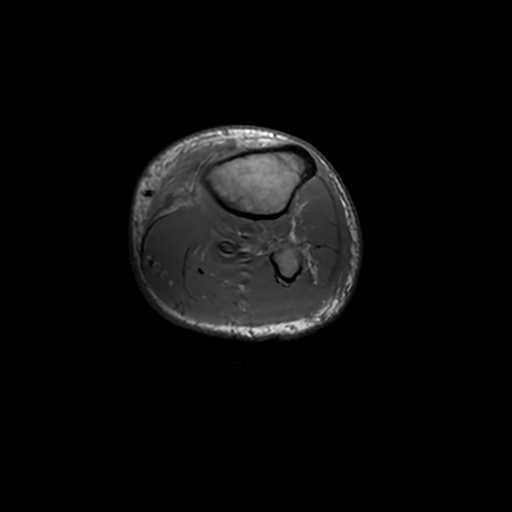
[im 84/84]
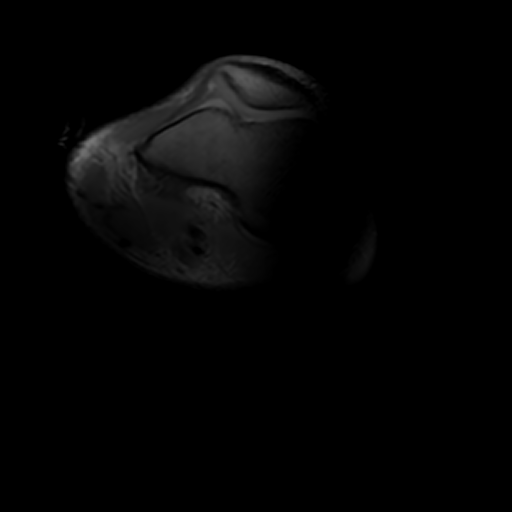

[Series 6: T1 fat-sat · axial · non-contrast · 4.0mm · 0.47mm/px · z∈[-234,+110]mm · 4 of 84 slices shown]
[im 1/84]
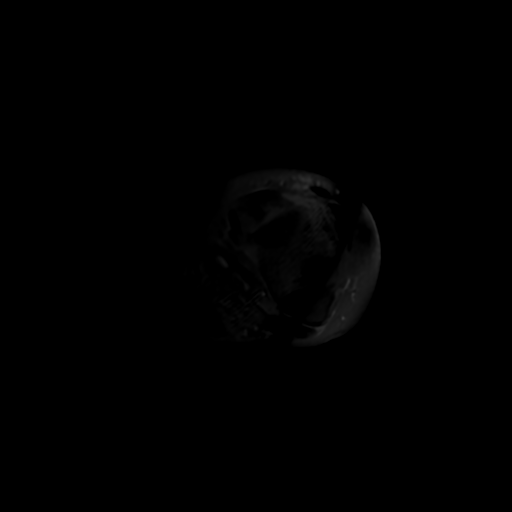
[im 14/84]
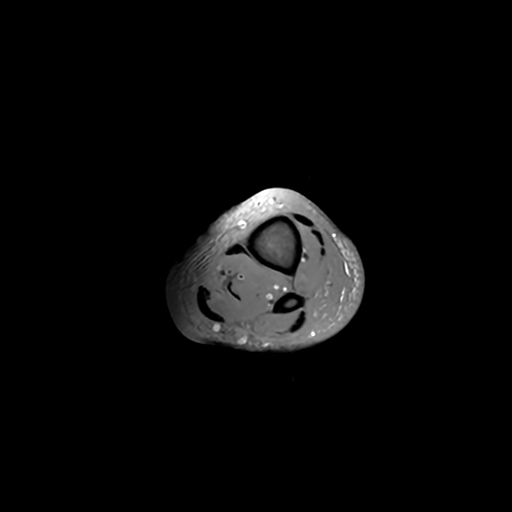
[im 42/84]
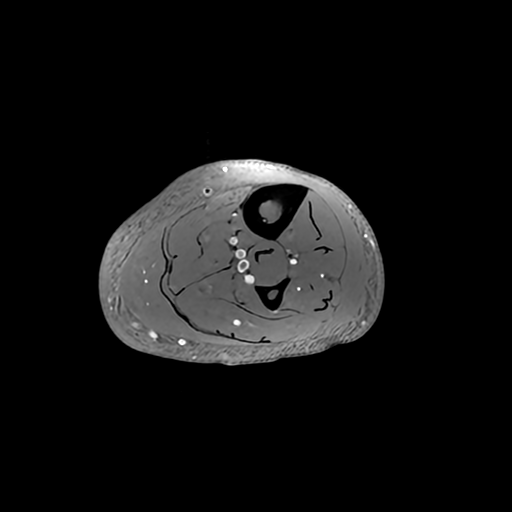
[im 70/84]
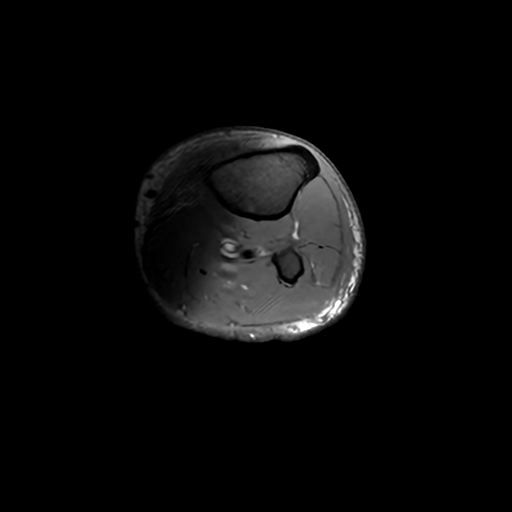

[Series 7: T2 fat-sat · axial · 4.0mm · 0.47mm/px · z∈[-234,+180]mm · 7 of 84 slices shown]
[im 1/84]
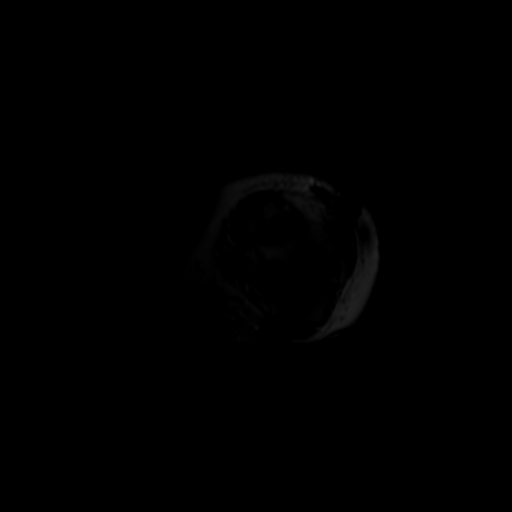
[im 14/84]
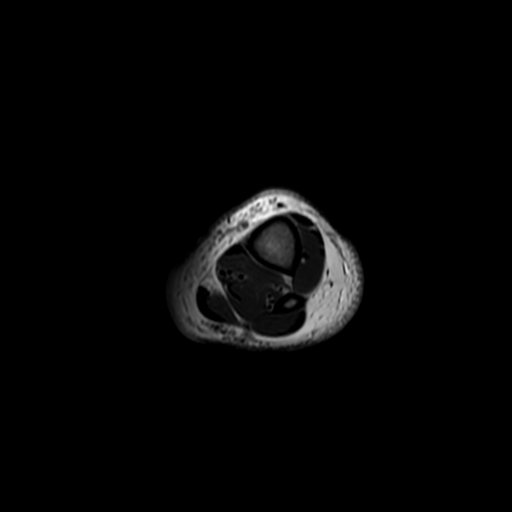
[im 28/84]
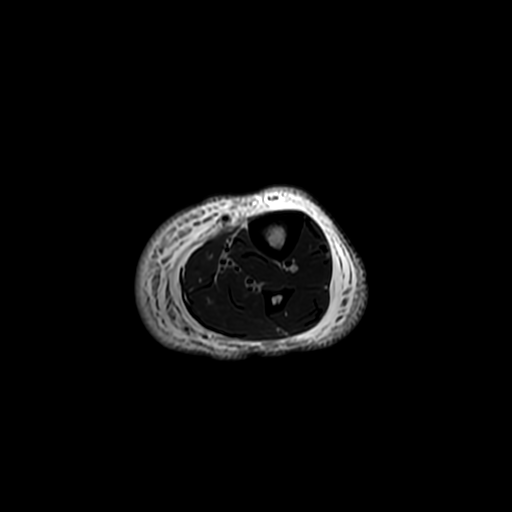
[im 42/84]
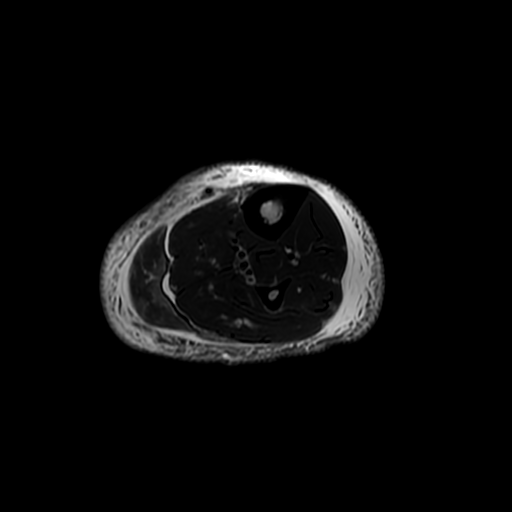
[im 56/84]
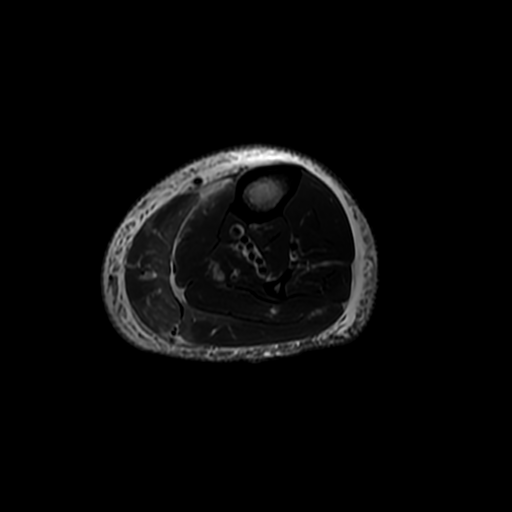
[im 70/84]
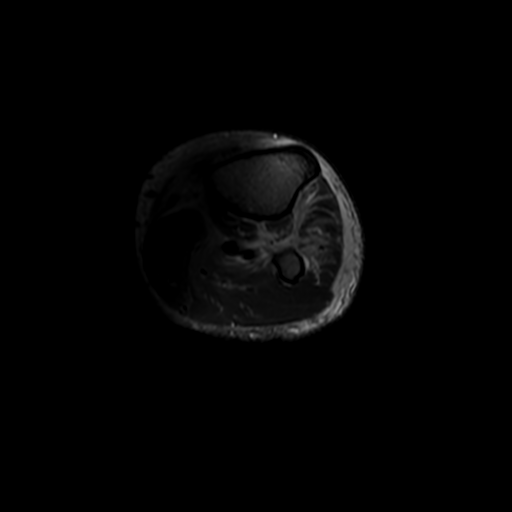
[im 84/84]
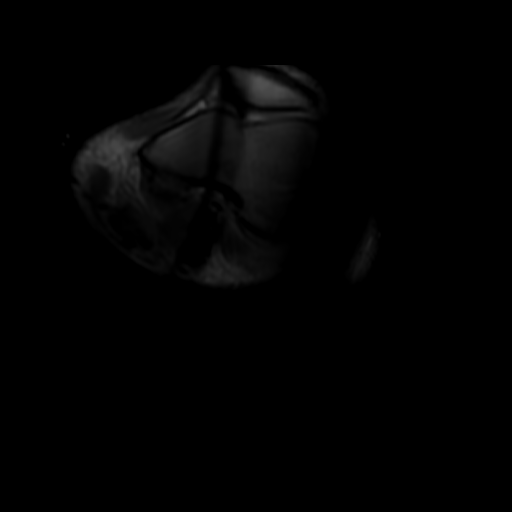

[19 of 40 positions shown; findings below may reference images not displayed]

FINDINGS: Bones/Joint/Cartilage

No abnormal marrow signal. Visualized joint space of the left knee
is unremarkable. Please see separately dictated MR left knee for
further findings regarding the entire knee joint.

Ligaments

Unremarkable.

Muscles and Tendons

Trace feathery signal noted within the proximal musculature of the
left leg ([DATE]). No such findings within the mid and distal left leg
musculature.

Edema noted tracking along the myofascial planes between the
gastrocnemius and soleus muscle ([DATE]) without enhancement noted on
the postcontrast images ([DATE], [DATE]).

No deep fascial plane enhancement. No organized fluid collection. No
findings suggest emphysema.

Soft tissues

Diffuse subcutaneus soft tissue edema of the left lower extremity.
The left lower extremity appear slightly asymmetrically larger
compared to the right. No organized fluid collection. No findings to
suggest emphysema.
IMPRESSION: 1. Findings consistent with left leg cellulitis.
2. No organized fluid collection.
3. No findings to suggest necrotizing fasciitis; however, please
note this is a clinical diagnosis.
4. No osseous abnormality.

## 2020-05-12 MED ORDER — GADOBUTROL 1 MMOL/ML IV SOLN
7.0000 mL | Freq: Once | INTRAVENOUS | Status: AC | PRN
  Administered 2020-05-12: 7 mL via INTRAVENOUS

## 2020-05-12 NOTE — Progress Notes (Signed)
Patient ID: [name], male   DOB: 1991-03-15, 29 y.o.   MRN: [id number]         [facility]  Date of Admission:  05/10/2020   Total days of antibiotics 3        Day 2 linezolid         ASSESSMENT: He has MRSA bacteremia and left knee infection.  Repeat blood cultures were ordered today.  TTE is pending.  He has acute kidney injury but his creatinine is improving.  PLAN: 1. Continue linezolid 2. Await results of repeat blood cultures and TTE 3. Agree with orthopedics consult.  Principal Problem:   MRSA bacteremia Active Problems:   Renal failure   Polysubstance abuse (HCC)   Sepsis due to cellulitis (HCC)   Hyponatremia   IVDU (intravenous drug user)   Septic joint of left knee joint (HCC)   Scheduled Meds:  heparin  5,000 Units Subcutaneous Q8H   senna-docusate  2 tablet Oral BID   Continuous Infusions:  sodium chloride 75 mL/hr at 05/12/20 1043   linezolid (ZYVOX) IV 600 mg (05/12/20 1040)   PRN Meds:.acetaminophen **OR** acetaminophen, HYDROmorphone (DILAUDID) injection, ondansetron **OR** ondansetron (ZOFRAN) IV, oxyCODONE   SUBJECTIVE: He is hurting all over but particularly in his left shoulder and left knee.  Review of Systems: Review of Systems  Constitutional: Negative for chills, diaphoresis and fever.  Musculoskeletal: Positive for joint pain.    No Known Allergies  OBJECTIVE: Vitals:   05/12/20 0230 05/12/20 0357 05/12/20 0456 05/12/20 0927  BP: 128/89  122/74 129/84  Pulse: 70  68 87  Resp: 18  19 18   Temp: 99.1 F (37.3 C)  98.2 F (36.8 C) 99 F (37.2 C)  TempSrc:    Oral  SpO2: 100%  98% 98%  Weight:  70.8 kg    Height:       Body mass index is 22.4 kg/m.  Physical Exam Constitutional:      Comments: He is in pain.  Cardiovascular:     Rate and Rhythm: Normal rate and regular rhythm.     Heart sounds: No murmur heard.   Pulmonary:     Effort: Pulmonary effort is normal.     Breath  sounds: Normal breath sounds.  Abdominal:     Palpations: Abdomen is soft.     Tenderness: There is no abdominal tenderness.  Musculoskeletal:        General: Swelling and tenderness present.     Comments: He has abrasions and dried blood around his left knee.  His knee is swollen, warm and painful.     Lab Results Lab Results  Component Value Date   WBC 16.7 (H) 05/12/2020   HGB 11.0 (L) 05/12/2020   HCT 33.2 (L) 05/12/2020   MCV 84.3 05/12/2020   PLT 478 (H) 05/12/2020    Lab Results  Component Value Date   CREATININE 4.20 (H) 05/12/2020   BUN 75 (H) 05/12/2020   NA 130 (L) 05/12/2020   K 4.6 05/12/2020   CL 99 05/12/2020   CO2 20 (L) 05/12/2020    Lab Results  Component Value Date   ALT 26 05/10/2020   AST 25 05/10/2020   ALKPHOS 60 05/10/2020   BILITOT 0.3 05/10/2020     Microbiology: Recent Results (from the past 240 hour(s))  Blood Culture (routine x 2)     Status: Abnormal (Preliminary result)   Collection Time: 05/10/20  6:43 PM   Specimen: BLOOD RIGHT ARM  Result Value Ref Range Status   Specimen Description BLOOD RIGHT ARM  Final   Special Requests   Final    BOTTLES DRAWN AEROBIC AND ANAEROBIC Blood Culture adequate volume   Culture  Setup Time   Final    GRAM POSITIVE COCCI IN CLUSTERS IN BOTH AEROBIC AND ANAEROBIC BOTTLES CRITICAL RESULT CALLED TO, READ BACK BY AND VERIFIED WITH: Silvio Pate [facility] [id number] AT 1531 BY CM Performed at [facility], [address], [place], [place] [address]    Culture STAPHYLOCOCCUS AUREUS (A)  Final   Report Status PENDING  Incomplete  Urine culture     Status: None   Collection Time: 05/10/20  6:43 PM   Specimen: In/Out Cath Urine  Result Value Ref Range Status   Specimen Description   Final    IN/OUT CATH URINE Performed at [facility], [address], [place], [place] [address]    Special Requests   Final    NONE Performed at [facility], [address],  [place], [place] [address]    Culture   Final    NO GROWTH Performed at [facility], [address] [place], [place] [address]    Report Status 05/12/2020 FINAL  Final  Blood Culture ID Panel (Reflexed)     Status: Abnormal   Collection Time: 05/10/20  6:43 PM  Result Value Ref Range Status   Enterococcus faecalis NOT DETECTED NOT DETECTED Final   Enterococcus Faecium NOT DETECTED NOT DETECTED Final   Listeria monocytogenes NOT DETECTED NOT DETECTED Final   Staphylococcus species DETECTED (A) NOT DETECTED Final    Comment: CRITICAL RESULT CALLED TO, READ BACK BY AND VERIFIED WITH: PAHRMD J WAFFORD [id number] AT 1531 BY CM    Staphylococcus aureus (BCID) DETECTED (A) NOT DETECTED Final    Comment: Methicillin (oxacillin)-resistant Staphylococcus aureus (MRSA). MRSA is predictably resistant to beta-lactam antibiotics (except ceftaroline). Preferred therapy is vancomycin unless clinically contraindicated. Patient requires contact precautions if  hospitalized. CRITICAL RESULT CALLED TO, READ BACK BY AND VERIFIED WITH: PHARMD J WAFFORD [id number] AT 1531 BY CM    Staphylococcus epidermidis NOT DETECTED NOT DETECTED Final   Staphylococcus lugdunensis NOT DETECTED NOT DETECTED Final   Streptococcus species NOT DETECTED NOT DETECTED Final   Streptococcus agalactiae NOT DETECTED NOT DETECTED Final   Streptococcus pneumoniae NOT DETECTED NOT DETECTED Final   Streptococcus pyogenes NOT DETECTED NOT DETECTED Final   A.calcoaceticus-baumannii NOT DETECTED NOT DETECTED Final   Bacteroides fragilis NOT DETECTED NOT DETECTED Final   Enterobacterales NOT DETECTED NOT DETECTED Final   Enterobacter cloacae complex NOT DETECTED NOT DETECTED Final   Escherichia coli NOT DETECTED NOT DETECTED Final   Klebsiella aerogenes NOT DETECTED NOT DETECTED Final   Klebsiella oxytoca NOT DETECTED NOT DETECTED Final   Klebsiella pneumoniae NOT DETECTED NOT DETECTED Final   Proteus species NOT DETECTED NOT DETECTED  Final   Salmonella species NOT DETECTED NOT DETECTED Final   Serratia marcescens NOT DETECTED NOT DETECTED Final   Haemophilus influenzae NOT DETECTED NOT DETECTED Final   Neisseria meningitidis NOT DETECTED NOT DETECTED Final   Pseudomonas aeruginosa NOT DETECTED NOT DETECTED Final   Stenotrophomonas maltophilia NOT DETECTED NOT DETECTED Final   Candida albicans NOT DETECTED NOT DETECTED Final   Candida auris NOT DETECTED NOT DETECTED Final   Candida glabrata NOT DETECTED NOT DETECTED Final   Candida krusei NOT DETECTED NOT DETECTED Final   Candida parapsilosis NOT DETECTED NOT DETECTED Final  Candida tropicalis NOT DETECTED NOT DETECTED Final   Cryptococcus neoformans/gattii NOT DETECTED NOT DETECTED Final   Meth resistant mecA/C and MREJ DETECTED (A) NOT DETECTED Final    Comment: CRITICAL RESULT CALLED TO, READ BACK BY AND VERIFIED WITH: Dorann Lodge [facility] [id number] AT 1531 BY CM Performed at [facility], [address], [place], [place] [address]   Blood Culture (routine x 2)     Status: Abnormal (Preliminary result)   Collection Time: 05/10/20  6:48 PM   Specimen: BLOOD  Result Value Ref Range Status   Specimen Description   Final    BLOOD RIGHT ANTECUBITAL Performed at [facility], [address], [place], [place] [address]    Special Requests   Final    BOTTLES DRAWN AEROBIC AND ANAEROBIC Blood Culture adequate volume Performed at [facility], [address] [place], [place] [address]    Culture  Setup Time   Final    GRAM POSITIVE COCCI IN CLUSTERS IN BOTH AEROBIC AND ANAEROBIC BOTTLES CRITICAL VALUE NOTED.  VALUE IS CONSISTENT WITH PREVIOUSLY REPORTED AND CALLED VALUE. Performed at [facility], [address], [place], [place] [address]    Culture STAPHYLOCOCCUS AUREUS (A)  Final   Report Status PENDING  Incomplete  SARS Coronavirus 2 by RT PCR (hospital order, performed in [facility] hospital lab) Nasopharyngeal  Nasopharyngeal Swab     Status: None   Collection Time: 05/10/20  7:25 PM   Specimen: Nasopharyngeal Swab  Result Value Ref Range Status   SARS Coronavirus 2 NEGATIVE NEGATIVE Final    Comment: (NOTE) SARS-CoV-2 target nucleic acids are NOT DETECTED.  The SARS-CoV-2 RNA is generally detectable in upper and lower respiratory specimens during the acute phase of infection. The lowest concentration of SARS-CoV-2 viral copies this assay can detect is 250 copies / mL. A negative result does not preclude SARS-CoV-2 infection and should not be used as the sole basis for treatment or other patient management decisions.  A negative result may occur with improper specimen collection / handling, submission of specimen other than nasopharyngeal swab, presence of viral mutation(s) within the areas targeted by this assay, and inadequate number of viral copies (<250 copies / mL). A negative result must be combined with clinical observations, patient history, and epidemiological information.  Fact Sheet for Patients:   BoilerBrush.com.cy  Fact Sheet for Healthcare Providers: https://pope.com/  This test is not yet approved or  cleared by the [place] FDA and has been authorized for detection and/or diagnosis of SARS-CoV-2 by FDA under an Emergency Use Authorization (EUA).  This EUA will remain in effect (meaning this test can be used) for the duration of the COVID-19 declaration under Section 564(b)(1) of the Act, 21 U.S.C. section 360bbb-3(b)(1), unless the authorization is terminated or revoked sooner.  Performed at [facility], [address] [place], [place] [address]     Cliffton Asters, MD [facility] [phone] pager   [phone] cell 05/12/2020, 1:35 PM

## 2020-05-12 NOTE — Progress Notes (Signed)
  Echocardiogram 2D Echocardiogram has been performed.  Stark Bray Swaim 05/12/2020, 11:56 AM
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
## 2020-05-12 NOTE — Plan of Care (Signed)
  Problem: Pain Managment: Goal: General experience of comfort will improve Outcome: Progressing   
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
## 2020-05-12 NOTE — Progress Notes (Signed)
[facility]   Aware of request for consult Will see pt today  Nurse reports that he has been eating  If surgical intervention deemed to be necessary will perform tomorrow as there is no emergent indication at the current time.  Would prefer the pt be NPO to decrease risk of periop complications such as aspiration   Mearl Latin, PA-C [phone] (C) 05/12/2020, 2:37 PM  [facility] [address] [place] [address] [phone] Collier Bullock (F)
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
## 2020-05-13 DIAGNOSIS — L0390 Cellulitis, unspecified: Secondary | ICD-10-CM

## 2020-05-13 DIAGNOSIS — N179 Acute kidney failure, unspecified: Secondary | ICD-10-CM

## 2020-05-13 DIAGNOSIS — A419 Sepsis, unspecified organism: Secondary | ICD-10-CM

## 2020-05-13 LAB — CULTURE, BLOOD (ROUTINE X 2)
Special Requests: ADEQUATE
Special Requests: ADEQUATE

## 2020-05-13 LAB — RENAL FUNCTION PANEL
Albumin: 1.5 g/dL — ABNORMAL LOW (ref 3.5–5.0)
Anion gap: 8 (ref 5–15)
BUN: 59 mg/dL — ABNORMAL HIGH (ref 6–20)
CO2: 19 mmol/L — ABNORMAL LOW (ref 22–32)
Calcium: 7.7 mg/dL — ABNORMAL LOW (ref 8.9–10.3)
Chloride: 105 mmol/L (ref 98–111)
Creatinine, Ser: 2.79 mg/dL — ABNORMAL HIGH (ref 0.61–1.24)
GFR calc Af Amer: 34 mL/min — ABNORMAL LOW (ref >60)
GFR calc non Af Amer: 29 mL/min — ABNORMAL LOW (ref >60)
Glucose, Bld: 101 mg/dL — ABNORMAL HIGH (ref 70–99)
Phosphorus: 3.5 mg/dL (ref 2.5–4.6)
Potassium: 4.9 mmol/L (ref 3.5–5.1)
Sodium: 132 mmol/L — ABNORMAL LOW (ref 135–145)

## 2020-05-13 LAB — CBC WITH DIFFERENTIAL/PLATELET
Abs Immature Granulocytes: 0.89 10*3/uL — ABNORMAL HIGH (ref 0.00–0.07)
Basophils Absolute: 0.1 10*3/uL (ref 0.0–0.1)
Basophils Relative: 1 %
Eosinophils Absolute: 0.4 10*3/uL (ref 0.0–0.5)
Eosinophils Relative: 3 %
HCT: 33.5 % — ABNORMAL LOW (ref 39.0–52.0)
Hemoglobin: 11.4 g/dL — ABNORMAL LOW (ref 13.0–17.0)
Immature Granulocytes: 5 %
Lymphocytes Relative: 12 %
Lymphs Abs: 2.1 10*3/uL (ref 0.7–4.0)
MCH: 28.9 pg (ref 26.0–34.0)
MCHC: 34 g/dL (ref 30.0–36.0)
MCV: 85 fL (ref 80.0–100.0)
Monocytes Absolute: 1.6 10*3/uL — ABNORMAL HIGH (ref 0.1–1.0)
Monocytes Relative: 9 %
Neutro Abs: 12 10*3/uL — ABNORMAL HIGH (ref 1.7–7.7)
Neutrophils Relative %: 70 %
Platelets: 501 10*3/uL — ABNORMAL HIGH (ref 150–400)
RBC: 3.94 MIL/uL — ABNORMAL LOW (ref 4.22–5.81)
RDW: 12.8 % (ref 11.5–15.5)
WBC: 17.1 10*3/uL — ABNORMAL HIGH (ref 4.0–10.5)
nRBC: 0 % (ref 0.0–0.2)

## 2020-05-13 LAB — HEPATITIS B SURFACE ANTIBODY, QUANTITATIVE: Hep B S AB Quant (Post): 101.4 m[IU]/mL (ref >9.9)

## 2020-05-13 LAB — CK: Total CK: 72 U/L (ref 49–397)

## 2020-05-13 MED ORDER — SODIUM CHLORIDE 0.9 % IV SOLN
560.0000 mg | Freq: Every day | INTRAVENOUS | Status: DC
  Administered 2020-05-13 – 2020-05-16 (×4): 560 mg via INTRAVENOUS
  Filled 2020-05-13 (×6): qty 11.2

## 2020-05-13 NOTE — Plan of Care (Signed)
  Problem: Activity: Goal: Risk for activity intolerance will decrease Outcome: Progressing   Problem: Pain Managment: Goal: General experience of comfort will improve Outcome: Progressing   
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
## 2020-05-13 NOTE — Plan of Care (Signed)
  Problem: Education: Goal: Knowledge of General Education information will improve Description: Including pain rating scale, medication(s)/side effects and non-pharmacologic comfort measures Outcome: Progressing   Problem: Pain Managment: Goal: General experience of comfort will improve Outcome: Progressing   
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
## 2020-05-13 NOTE — Progress Notes (Signed)
    [facility] has been requested to perform a transesophageal echocardiogram on [name] for bacteremia.  After careful review of history and examination, the risks and benefits of transesophageal echocardiogram have been explained including risks of esophageal damage, perforation (1:10,000 risk), bleeding, pharyngeal hematoma as well as other potential complications associated with conscious sedation including aspiration, arrhythmia, respiratory failure and death. Alternatives to treatment were discussed, questions were answered. Patient is willing to proceed.   Halaina Vanduzer David Stall, PA-C  05/13/2020 3:14 PM

## 2020-05-13 NOTE — Progress Notes (Signed)
Pharmacy Antibiotic Note  [name] is a 29 y.o. male admitted on 05/10/2020 with MRSA bacteremia and left knee infection.  Pharmacy has been consulted for daptomycin dosing.  TTE with thin mobile density below tricuspid valve but unclear if vegetation or valve. No evidence of septic emboli. Patient's renal function significantly improving but still not at baseline. Patient is afebrile, WBC 17.1.   Plan: Stop linezolid  Start daptomycin 560mg  (8mg /kg) IV every 24 hours  Monitor C&S, renal function, clinical improvement and results of TEE for LOT Monitor CK weekly while on daptomycin  Height: 5\' 10"  (177.8 cm) Weight: 70.8 kg (156 lb 1.4 oz) IBW/kg (Calculated) : 73  Temp (24hrs), Avg:98.8 F (37.1 C), Min:98.5 F (36.9 C), Max:99.1 F (37.3 C)  Recent Labs  Lab 05/10/20 1843 05/10/20 1925 05/10/20 1937 05/11/20 0425 05/12/20 0855 05/13/20 0356  WBC 17.6*  --   --  17.5* 16.7* 17.1*  CREATININE 10.19*  --  10.70* 8.31* 4.20* 2.79*  LATICACIDVEN 0.9 1.1  --   --   --   --     Estimated Creatinine Clearance: 39.1 mL/min (A) (by C-G formula based on SCr of 2.79 mg/dL (H)).    No Known Allergies  Antimicrobials this admission: Vancomycin 9/18 x1 Ceftriaxone 9/18 x1 Linezolid 9/19>9/21 Daptomycin 9/21 >>   Dose adjustments this admission: N/A  Microbiology results: 9/18 Bcx: MRSA 4o4 bottles 9/18 UCx: neg 9/20 BCx: sent  Thank you for allowing pharmacy to be a part of this patient's care.  10/18, PharmD PGY1 Acute Care Pharmacy Resident 05/13/2020 11:06 AM  Please check [email or web address] for unit specific pharmacy phone numbers.

## 2020-05-13 NOTE — Plan of Care (Signed)
  Problem: Clinical Measurements: Goal: Will remain free from infection Outcome: Progressing   Problem: Pain Managment: Goal: General experience of comfort will improve 05/13/2020 0754 by Katrina Stack, RN Outcome: Progressing 05/13/2020 0737 by Katrina Stack, RN Outcome: Progressing

## 2020-05-14 ENCOUNTER — Inpatient Hospital Stay (HOSPITAL_COMMUNITY): Payer: Self-pay | Admitting: Certified Registered"

## 2020-05-14 ENCOUNTER — Encounter (HOSPITAL_COMMUNITY)
Admission: EM | Disposition: A | Discharge status: Home / Self Care | Payer: Self-pay | Source: Home / Self Care | Attending: Internal Medicine

## 2020-05-14 ENCOUNTER — Inpatient Hospital Stay (HOSPITAL_COMMUNITY): Payer: Self-pay

## 2020-05-14 ENCOUNTER — Encounter (HOSPITAL_COMMUNITY): Payer: Self-pay | Admitting: Family Medicine

## 2020-05-14 DIAGNOSIS — R7881 Bacteremia: Secondary | ICD-10-CM

## 2020-05-14 DIAGNOSIS — J90 Pleural effusion, not elsewhere classified: Secondary | ICD-10-CM

## 2020-05-14 HISTORY — PX: TEE WITHOUT CARDIOVERSION: SHX5443

## 2020-05-14 LAB — CBC
HCT: 35 % — ABNORMAL LOW (ref 39.0–52.0)
Hemoglobin: 11.4 g/dL — ABNORMAL LOW (ref 13.0–17.0)
MCH: 27.8 pg (ref 26.0–34.0)
MCHC: 32.6 g/dL (ref 30.0–36.0)
MCV: 85.4 fL (ref 80.0–100.0)
Platelets: 484 10*3/uL — ABNORMAL HIGH (ref 150–400)
RBC: 4.1 MIL/uL — ABNORMAL LOW (ref 4.22–5.81)
RDW: 12.8 % (ref 11.5–15.5)
WBC: 19.8 10*3/uL — ABNORMAL HIGH (ref 4.0–10.5)
nRBC: 0 % (ref 0.0–0.2)

## 2020-05-14 LAB — RENAL FUNCTION PANEL
Albumin: 1.4 g/dL — ABNORMAL LOW (ref 3.5–5.0)
Anion gap: 9 (ref 5–15)
BUN: 53 mg/dL — ABNORMAL HIGH (ref 6–20)
CO2: 20 mmol/L — ABNORMAL LOW (ref 22–32)
Calcium: 7.9 mg/dL — ABNORMAL LOW (ref 8.9–10.3)
Chloride: 100 mmol/L (ref 98–111)
Creatinine, Ser: 2.8 mg/dL — ABNORMAL HIGH (ref 0.61–1.24)
GFR calc Af Amer: 34 mL/min — ABNORMAL LOW (ref >60)
GFR calc non Af Amer: 29 mL/min — ABNORMAL LOW (ref >60)
Glucose, Bld: 106 mg/dL — ABNORMAL HIGH (ref 70–99)
Phosphorus: 4.8 mg/dL — ABNORMAL HIGH (ref 2.5–4.6)
Potassium: 4.9 mmol/L (ref 3.5–5.1)
Sodium: 129 mmol/L — ABNORMAL LOW (ref 135–145)

## 2020-05-14 SURGERY — ECHOCARDIOGRAM, TRANSESOPHAGEAL
Anesthesia: Monitor Anesthesia Care

## 2020-05-14 MED ORDER — OXYCODONE HCL 5 MG PO TABS
5.0000 mg | ORAL_TABLET | ORAL | Status: DC | PRN
  Administered 2020-05-14 – 2020-05-15 (×3): 10 mg via ORAL
  Filled 2020-05-14 (×3): qty 2

## 2020-05-14 MED ORDER — PROPOFOL 10 MG/ML IV BOLUS
INTRAVENOUS | Status: DC | PRN
  Administered 2020-05-14: 20 mg via INTRAVENOUS
  Administered 2020-05-14: 80 mg via INTRAVENOUS

## 2020-05-14 MED ORDER — LIDOCAINE 2% (20 MG/ML) 5 ML SYRINGE
INTRAMUSCULAR | Status: DC | PRN
  Administered 2020-05-14: 70 mg via INTRAVENOUS

## 2020-05-14 MED ORDER — HYDROMORPHONE HCL 1 MG/ML IJ SOLN
0.5000 mg | INTRAMUSCULAR | Status: DC | PRN
  Administered 2020-05-14 – 2020-05-15 (×5): 1 mg via INTRAVENOUS
  Filled 2020-05-14 (×5): qty 1

## 2020-05-14 MED ORDER — SODIUM CHLORIDE 0.9 % IV SOLN: INTRAVENOUS | Status: DC

## 2020-05-14 MED ORDER — PROPOFOL 500 MG/50ML IV EMUL
INTRAVENOUS | Status: DC | PRN
  Administered 2020-05-14: 150 ug/kg/min via INTRAVENOUS
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
## 2020-05-14 NOTE — Progress Notes (Signed)
Patient ID: [name], male   DOB: 1990-10-19, 29 y.o.   MRN: [id number]         [facility] Division for Infectious Disease  Date of Admission:  05/10/2020   Total days of antibiotics 5        Day 2 daptomycin         ASSESSMENT: He has MRSA bacteremia and left knee infection.  There was no evidence of endocarditis on TEE this morning.  The soft tissue infection around his left knee is improving.  Repeat blood cultures are negative at 48 hours  PLAN: 1. Continue daptomycin 2. Await results of repeat blood cultures   Principal Problem:   MRSA bacteremia Active Problems:   IVDU (intravenous drug user)   Renal failure   Polysubstance abuse (HCC)   Sepsis due to cellulitis (HCC)   Hyponatremia   Cellulitis of left lower extremity   Scheduled Meds: . heparin  5,000 Units Subcutaneous Q8H  . senna-docusate  2 tablet Oral BID   Continuous Infusions: . DAPTOmycin (CUBICIN)  IV 560 mg (05/13/20 2012)   PRN Meds:.acetaminophen **OR** acetaminophen, HYDROmorphone (DILAUDID) injection, ondansetron **OR** ondansetron (ZOFRAN) IV, oxyCODONE   SUBJECTIVE: He says that he is not feeling any better.  He continues to feel pain all over.  Review of Systems: Review of Systems  Constitutional: Negative for chills, diaphoresis and fever.  Musculoskeletal: Positive for joint pain.    No Known Allergies  OBJECTIVE: Vitals:   05/14/20 0848 05/14/20 0935 05/14/20 0945 05/14/20 1011  BP: (!) 149/88 132/74 131/80 137/85  Pulse: 82 81 74 80  Resp: 14 19 15 18   Temp: 97.9 F (36.6 C) 98.6 F (37 C)  99.2 F (37.3 C)  TempSrc: Oral Axillary  Oral  SpO2: 97% 100% 97% 97%  Weight: 70.8 kg     Height: 5\' 10"  (1.778 m)      Body mass index is 22.4 kg/m.  Physical Exam Constitutional:      Comments: He was asleep when I entered the room but would arouse and shake his head yes or no to questions.  Cardiovascular:     Rate and Rhythm: Normal rate and regular rhythm.     Heart  sounds: No murmur heard.   Pulmonary:     Effort: Pulmonary effort is normal.     Breath sounds: Normal breath sounds.  Abdominal:     Palpations: Abdomen is soft.     Tenderness: There is no abdominal tenderness.  Musculoskeletal:        General: Swelling and tenderness present.     Comments: He has abrasions and dried blood around his left knee.  His left knee is not as swollen or warm as it was.     Lab Results Lab Results  Component Value Date   WBC 19.8 (H) 05/14/2020   HGB 11.4 (L) 05/14/2020   HCT 35.0 (L) 05/14/2020   MCV 85.4 05/14/2020   PLT 484 (H) 05/14/2020    Lab Results  Component Value Date   CREATININE 2.80 (H) 05/14/2020   BUN 53 (H) 05/14/2020   NA 129 (L) 05/14/2020   K 4.9 05/14/2020   CL 100 05/14/2020   CO2 20 (L) 05/14/2020    Lab Results  Component Value Date   ALT 26 05/10/2020   AST 25 05/10/2020   ALKPHOS 60 05/10/2020   BILITOT 0.3 05/10/2020     Microbiology: Recent Results (from the past 240 hour(s))  Blood Culture (routine x 2)  Status: Abnormal   Collection Time: 05/10/20  6:43 PM   Specimen: BLOOD RIGHT ARM  Result Value Ref Range Status   Specimen Description BLOOD RIGHT ARM  Final   Special Requests   Final    BOTTLES DRAWN AEROBIC AND ANAEROBIC Blood Culture adequate volume   Culture  Setup Time   Final    GRAM POSITIVE COCCI IN CLUSTERS IN BOTH AEROBIC AND ANAEROBIC BOTTLES CRITICAL RESULT CALLED TO, READ BACK BY AND VERIFIED WITH: Silvio Pate [facility] [id number] AT 1531 BY CM Performed at [facility], [address] [place], [place] [address]    Culture METHICILLIN RESISTANT STAPHYLOCOCCUS AUREUS (A)  Final   Report Status 05/13/2020 FINAL  Final   Organism ID, Bacteria METHICILLIN RESISTANT STAPHYLOCOCCUS AUREUS  Final      Susceptibility   Methicillin resistant staphylococcus aureus - MIC*    CIPROFLOXACIN <=0.5 SENSITIVE Sensitive     ERYTHROMYCIN >=8 RESISTANT Resistant     GENTAMICIN <=0.5 SENSITIVE Sensitive      OXACILLIN >=4 RESISTANT Resistant     TETRACYCLINE <=1 SENSITIVE Sensitive     VANCOMYCIN <=0.5 SENSITIVE Sensitive     TRIMETH/SULFA <=10 SENSITIVE Sensitive     CLINDAMYCIN >=8 RESISTANT Resistant     RIFAMPIN <=0.5 SENSITIVE Sensitive     Inducible Clindamycin NEGATIVE Sensitive     * METHICILLIN RESISTANT STAPHYLOCOCCUS AUREUS  Urine culture     Status: None   Collection Time: 05/10/20  6:43 PM   Specimen: In/Out Cath Urine  Result Value Ref Range Status   Specimen Description   Final    IN/OUT CATH URINE Performed at [facility], [address], [place], [place] [address]    Special Requests   Final    NONE Performed at [facility], [address], [place], [place] [address]    Culture   Final    NO GROWTH Performed at [facility], [address], [place], [place] [address]    Report Status 05/12/2020 FINAL  Final  Blood Culture ID Panel (Reflexed)     Status: Abnormal   Collection Time: 05/10/20  6:43 PM  Result Value Ref Range Status   Enterococcus faecalis NOT DETECTED NOT DETECTED Final   Enterococcus Faecium NOT DETECTED NOT DETECTED Final   Listeria monocytogenes NOT DETECTED NOT DETECTED Final   Staphylococcus species DETECTED (A) NOT DETECTED Final    Comment: CRITICAL RESULT CALLED TO, READ BACK BY AND VERIFIED WITH: PAHRMD J WAFFORD [id number] AT 1531 BY CM    Staphylococcus aureus (BCID) DETECTED (A) NOT DETECTED Final    Comment: Methicillin (oxacillin)-resistant Staphylococcus aureus (MRSA). MRSA is predictably resistant to beta-lactam antibiotics (except ceftaroline). Preferred therapy is vancomycin unless clinically contraindicated. Patient requires contact precautions if  hospitalized. CRITICAL RESULT CALLED TO, READ BACK BY AND VERIFIED WITH: PHARMD J WAFFORD [id number] AT 1531 BY CM    Staphylococcus epidermidis NOT DETECTED NOT DETECTED Final   Staphylococcus lugdunensis NOT DETECTED NOT DETECTED Final    Streptococcus species NOT DETECTED NOT DETECTED Final   Streptococcus agalactiae NOT DETECTED NOT DETECTED Final   Streptococcus pneumoniae NOT DETECTED NOT DETECTED Final   Streptococcus pyogenes NOT DETECTED NOT DETECTED Final   A.calcoaceticus-baumannii NOT DETECTED NOT DETECTED Final   Bacteroides fragilis NOT DETECTED NOT DETECTED Final   Enterobacterales NOT DETECTED NOT DETECTED Final   Enterobacter cloacae complex NOT DETECTED NOT DETECTED Final   Escherichia coli NOT DETECTED NOT DETECTED Final   Klebsiella aerogenes  NOT DETECTED NOT DETECTED Final   Klebsiella oxytoca NOT DETECTED NOT DETECTED Final   Klebsiella pneumoniae NOT DETECTED NOT DETECTED Final   Proteus species NOT DETECTED NOT DETECTED Final   Salmonella species NOT DETECTED NOT DETECTED Final   Serratia marcescens NOT DETECTED NOT DETECTED Final   Haemophilus influenzae NOT DETECTED NOT DETECTED Final   Neisseria meningitidis NOT DETECTED NOT DETECTED Final   Pseudomonas aeruginosa NOT DETECTED NOT DETECTED Final   Stenotrophomonas maltophilia NOT DETECTED NOT DETECTED Final   Candida albicans NOT DETECTED NOT DETECTED Final   Candida auris NOT DETECTED NOT DETECTED Final   Candida glabrata NOT DETECTED NOT DETECTED Final   Candida krusei NOT DETECTED NOT DETECTED Final   Candida parapsilosis NOT DETECTED NOT DETECTED Final   Candida tropicalis NOT DETECTED NOT DETECTED Final   Cryptococcus neoformans/gattii NOT DETECTED NOT DETECTED Final   Meth resistant mecA/C and MREJ DETECTED (A) NOT DETECTED Final    Comment: CRITICAL RESULT CALLED TO, READ BACK BY AND VERIFIED WITH: [facility]ann [facility] [id number] AT 1531 BY CM Performed at [facility], [address] [place], [place] [address]   Blood Culture (routine x 2)     Status: Abnormal   Collection Time: 05/10/20  6:48 PM   Specimen: BLOOD  Result Value Ref Range Status   Specimen Description   Final    BLOOD RIGHT ANTECUBITAL Performed at [facility], [address], [place], [place] [address]    Special Requests   Final    BOTTLES DRAWN AEROBIC AND ANAEROBIC Blood Culture adequate volume Performed at [facility], [address], [place], [place] [address]    Culture  Setup Time   Final    GRAM POSITIVE COCCI IN CLUSTERS IN BOTH AEROBIC AND ANAEROBIC BOTTLES CRITICAL VALUE NOTED.  VALUE IS CONSISTENT WITH PREVIOUSLY REPORTED AND CALLED VALUE.    Culture (A)  Final    STAPHYLOCOCCUS AUREUS SUSCEPTIBILITIES PERFORMED ON PREVIOUS CULTURE WITHIN THE LAST 5 DAYS. Performed at [facility], [address], [place], [place] [address]    Report Status 05/13/2020 FINAL  Final  SARS Coronavirus 2 by RT PCR (hospital order, performed in [facility] hospital lab) Nasopharyngeal Nasopharyngeal Swab     Status: None   Collection Time: 05/10/20  7:25 PM   Specimen: Nasopharyngeal Swab  Result Value Ref Range Status   SARS Coronavirus 2 NEGATIVE NEGATIVE Final    Comment: (NOTE) SARS-CoV-2 target nucleic acids are NOT DETECTED.  The SARS-CoV-2 RNA is generally detectable in upper and lower respiratory specimens during the acute phase of infection. The lowest concentration of SARS-CoV-2 viral copies this assay can detect is 250 copies / mL. A negative result does not preclude SARS-CoV-2 infection and should not be used as the sole basis for treatment or other patient management decisions.  A negative result may occur with improper specimen collection / handling, submission of specimen other than nasopharyngeal swab, presence of viral mutation(s) within the areas targeted by this assay, and inadequate number of viral copies (<250 copies / mL). A negative result must be combined with clinical observations, patient history, and epidemiological information.  Fact Sheet for Patients:   BoilerBrush.com.cy  Fact Sheet for Healthcare  Providers: https://pope.com/  This test is not yet approved or  cleared by the [place] FDA and has been authorized for detection and/or diagnosis of SARS-CoV-2 by FDA under an Emergency Use Authorization (EUA).  This EUA will remain in effect (  meaning this test can be used) for the duration of the COVID-19 declaration under Section 564(b)(1) of the Act, 21 U.S.C. section 360bbb-3(b)(1), unless the authorization is terminated or revoked sooner.  Performed at [facility], [address] [place], [place] [address]   Culture, blood (Routine X 2) w Reflex to ID Panel     Status: None (Preliminary result)   Collection Time: 05/12/20  8:55 AM   Specimen: BLOOD LEFT ARM  Result Value Ref Range Status   Specimen Description BLOOD LEFT ARM  Final   Special Requests   Final    BOTTLES DRAWN AEROBIC ONLY Blood Culture results may not be optimal due to an inadequate volume of blood received in culture bottles   Culture   Final    NO GROWTH 1 DAY Performed at [facility], [address], [place], [place] [address]    Report Status PENDING  Incomplete  Culture, blood (Routine X 2) w Reflex to ID Panel     Status: None (Preliminary result)   Collection Time: 05/12/20  8:55 AM   Specimen: BLOOD LEFT ARM  Result Value Ref Range Status   Specimen Description BLOOD LEFT ARM  Final   Special Requests   Final    BOTTLES DRAWN AEROBIC ONLY Blood Culture results may not be optimal due to an inadequate volume of blood received in culture bottles   Culture   Final    NO GROWTH 1 DAY Performed at [facility], [address], [place], [place] [address]    Report Status PENDING  Incomplete    Cliffton Asters, MD [facility] for Infectious Disease [facility] [phone] pager   [phone] cell 05/14/2020, 12:15 PM

## 2020-05-14 NOTE — Transfer of Care (Signed)
Immediate Anesthesia Transfer of Care Note  Patient: [name]  Procedure(s) Performed: TRANSESOPHAGEAL ECHOCARDIOGRAM (TEE) (N/A )  Patient Location: PACU  Anesthesia Type:MAC  Level of Consciousness: awake, alert  and oriented  Airway & Oxygen Therapy: Patient Spontanous Breathing and Patient connected to nasal cannula oxygen  Post-op Assessment: Report given to RN and Post -op Vital signs reviewed and stable  Post vital signs: Reviewed and stable  Last Vitals:  Vitals Value Taken Time  BP 132/74 05/14/20 0935  Temp 37 C 05/14/20 0935  Pulse 82 05/14/20 0936  Resp 20 05/14/20 0936  SpO2 100 % 05/14/20 0936  Vitals shown include unvalidated device data.  Last Pain:  Vitals:   05/14/20 0935  TempSrc: Axillary  PainSc: Asleep      Patients Stated Pain Goal: 0 (77/82/42 3536)  Complications: No complications documented.

## 2020-05-14 NOTE — Anesthesia Preprocedure Evaluation (Signed)
Anesthesia Evaluation  Patient identified by MRN, date of birth, ID band Patient awake    Reviewed: Allergy & Precautions, NPO status , Patient's Chart, lab work & pertinent test results  Airway Mallampati: II  TM Distance: >3 FB Neck ROM: Full    Dental no notable dental hx. (+) Dental Advisory Given   Pulmonary Current Smoker,    Pulmonary exam normal        Cardiovascular negative cardio ROS Normal cardiovascular exam  IMPRESSIONS   1. Left ventricular ejection fraction, by estimation, is 60 to 65%. The left ventricle has normal function. The left ventricle has no regional wall motion abnormalities. Left ventricular diastolic parameters were normal. 2. Right ventricular systolic function is normal. The right ventricular size is normal. There is normal pulmonary artery systolic pressure. The estimated right ventricular systolic pressure is 38.9 mmHg. 3. The mitral valve is normal in structure. Trivial mitral valve regurgitation. No evidence of mitral stenosis. 4. The aortic valve is normal in structure. Aortic valve regurgitation is not visualized. No aortic stenosis is present. 5. The inferior vena cava is normal in size with greater than 50% respiratory variability, suggesting right atrial pressure of 3 mmHg. 6. There is a thin mobile density below the TV annulus. Unclear whether this is a vegetation or eustacion valve. 7. Recommend TEE for further evaluation   Neuro/Psych negative neurological ROS  negative psych ROS   GI/Hepatic negative GI ROS, (+)     substance abuse  IV drug use,   Endo/Other  negative endocrine ROS  Renal/GU Renal InsufficiencyRenal disease  negative genitourinary   Musculoskeletal negative musculoskeletal ROS (+)   Abdominal   Peds negative pediatric ROS (+)  Hematology negative hematology ROS (+)   Anesthesia Other Findings   Reproductive/Obstetrics negative OB ROS                              Anesthesia Physical Anesthesia Plan  ASA: III  Anesthesia Plan: MAC   Post-op Pain Management:    Induction:   PONV Risk Score and Plan:   Airway Management Planned: Natural Airway  Additional Equipment:   Intra-op Plan:   Post-operative Plan:   Informed Consent: I have reviewed the patients History and Physical, chart, labs and discussed the procedure including the risks, benefits and alternatives for the proposed anesthesia with the patient or authorized representative who has indicated his/her understanding and acceptance.     Dental advisory given  Plan Discussed with: CRNA and Anesthesiologist  Anesthesia Plan Comments:         Anesthesia Quick Evaluation

## 2020-05-14 NOTE — Plan of Care (Signed)
  Problem: Pain Managment: Goal: General experience of comfort will improve Outcome: Progressing   

## 2020-05-14 NOTE — Anesthesia Postprocedure Evaluation (Signed)
Anesthesia Post Note  Patient: [name]  Procedure(s) Performed: TRANSESOPHAGEAL ECHOCARDIOGRAM (TEE) (N/A )     Patient location during evaluation: Endoscopy Anesthesia Type: MAC Level of consciousness: awake and alert Pain management: pain level controlled Vital Signs Assessment: post-procedure vital signs reviewed and stable Respiratory status: spontaneous breathing, nonlabored ventilation, respiratory function stable and patient connected to nasal cannula oxygen Cardiovascular status: blood pressure returned to baseline and stable Postop Assessment: no apparent nausea or vomiting Anesthetic complications: no   No complications documented.  Last Vitals:  Vitals:   05/14/20 0935 05/14/20 0945  BP: 132/74 131/80  Pulse: 81 74  Resp: 19 15  Temp: 37 C   SpO2: 100% 97%    Last Pain:  Vitals:   05/14/20 0945  TempSrc:   PainSc: Asleep                 Hatcher Froning DANIEL

## 2020-05-14 NOTE — Plan of Care (Signed)
  Problem: Education: Goal: Knowledge of General Education information will improve Description: Including pain rating scale, medication(s)/side effects and non-pharmacologic comfort measures Outcome: Progressing   Problem: Pain Managment: Goal: General experience of comfort will improve Outcome: Progressing   

## 2020-05-14 NOTE — Interval H&P Note (Signed)
History and Physical Interval Note:  05/14/2020 8:58 AM  [name]  has presented today for surgery, with the diagnosis of BACTERIMIA IV DRUG USE.  The various methods of treatment have been discussed with the patient and family. After consideration of risks, benefits and other options for treatment, the patient has consented to  Procedure(s): TRANSESOPHAGEAL ECHOCARDIOGRAM (TEE) (N/A) as a surgical intervention.  The patient's history has been reviewed, patient examined, no change in status, stable for surgery.  I have reviewed the patient's chart and labs.  Questions were answered to the patient's satisfaction.     [name]

## 2020-05-14 NOTE — Op Note (Signed)
INDICATIONS: bacteremia  PROCEDURE:   Informed consent was obtained prior to the procedure. The risks, benefits and alternatives for the procedure were discussed and the patient comprehended these risks.  Risks include, but are not limited to, cough, sore throat, vomiting, nausea, somnolence, esophageal and stomach trauma or perforation, bleeding, low blood pressure, aspiration, pneumonia, infection, trauma to the teeth and death.    After a procedural time-out, the oropharynx was anesthetized with 20% benzocaine spray.   During this procedure the patient was administered IV propofol by Anesthesiology (Dr. Krista Blue).  The transesophageal probe was inserted in the esophagus and stomach without difficulty and multiple views were obtained.  The patient was kept under observation until the patient left the procedure room.  The patient left the procedure room in stable condition.   Agitated microbubble saline contrast was not administered.  COMPLICATIONS:    There were no immediate complications.  FINDINGS:  Normal TEE. No evidence of endocarditis.   Time Spent Directly with the Patient:  30 minutes   [name] 05/14/2020, 9:30 AM

## 2020-05-14 NOTE — Progress Notes (Signed)
  Echocardiogram Echocardiogram Transesophageal has been performed.  Tye Savoy 05/14/2020, 9:45 AM

## 2020-05-15 LAB — CBC
HCT: 35.9 % — ABNORMAL LOW (ref 39.0–52.0)
Hemoglobin: 12.2 g/dL — ABNORMAL LOW (ref 13.0–17.0)
MCH: 28.6 pg (ref 26.0–34.0)
MCHC: 34 g/dL (ref 30.0–36.0)
MCV: 84.3 fL (ref 80.0–100.0)
Platelets: 519 10*3/uL — ABNORMAL HIGH (ref 150–400)
RBC: 4.26 MIL/uL (ref 4.22–5.81)
RDW: 12.7 % (ref 11.5–15.5)
WBC: 33.8 10*3/uL — ABNORMAL HIGH (ref 4.0–10.5)
nRBC: 0 % (ref 0.0–0.2)

## 2020-05-15 LAB — RENAL FUNCTION PANEL
Albumin: 1.4 g/dL — ABNORMAL LOW (ref 3.5–5.0)
Anion gap: 8 (ref 5–15)
BUN: 54 mg/dL — ABNORMAL HIGH (ref 6–20)
CO2: 20 mmol/L — ABNORMAL LOW (ref 22–32)
Calcium: 7.8 mg/dL — ABNORMAL LOW (ref 8.9–10.3)
Chloride: 98 mmol/L (ref 98–111)
Creatinine, Ser: 2.76 mg/dL — ABNORMAL HIGH (ref 0.61–1.24)
GFR calc Af Amer: 34 mL/min — ABNORMAL LOW (ref >60)
GFR calc non Af Amer: 30 mL/min — ABNORMAL LOW (ref >60)
Glucose, Bld: 110 mg/dL — ABNORMAL HIGH (ref 70–99)
Phosphorus: 5.8 mg/dL — ABNORMAL HIGH (ref 2.5–4.6)
Potassium: 5.2 mmol/L — ABNORMAL HIGH (ref 3.5–5.1)
Sodium: 126 mmol/L — ABNORMAL LOW (ref 135–145)

## 2020-05-15 LAB — HCV RNA QUANT RFLX ULTRA OR GENOTYP

## 2020-05-15 LAB — HEPATITIS C GENOTYPE: HCV Genotype: 3

## 2020-05-15 LAB — LIPASE, BLOOD: Lipase: 25 U/L (ref 11–51)

## 2020-05-15 LAB — HCV RNA (INTERNATIONAL UNITS)
HCV log10: 7.068 log10 IU/mL
Hcv Rna (International Units): 11700000 IU/mL

## 2020-05-15 MED ORDER — SODIUM CHLORIDE 0.9 % IV SOLN: INTRAVENOUS | Status: DC

## 2020-05-15 MED ORDER — HYDROMORPHONE HCL 1 MG/ML IJ SOLN
0.5000 mg | INTRAMUSCULAR | Status: DC | PRN
  Administered 2020-05-15 – 2020-05-17 (×14): 0.5 mg via INTRAVENOUS
  Filled 2020-05-15: qty 0.5
  Filled 2020-05-15: qty 1
  Filled 2020-05-15: qty 0.5
  Filled 2020-05-15 (×4): qty 1
  Filled 2020-05-15: qty 0.5
  Filled 2020-05-15: qty 1
  Filled 2020-05-15: qty 0.5
  Filled 2020-05-15: qty 1
  Filled 2020-05-15: qty 0.5
  Filled 2020-05-15: qty 1
  Filled 2020-05-15 (×3): qty 0.5
  Filled 2020-05-15: qty 1

## 2020-05-15 MED ORDER — HYDROMORPHONE HCL 1 MG/ML IJ SOLN
0.5000 mg | INTRAMUSCULAR | Status: DC | PRN
  Administered 2020-05-15: 0.5 mg via INTRAVENOUS
  Filled 2020-05-15: qty 1

## 2020-05-15 MED ORDER — SODIUM ZIRCONIUM CYCLOSILICATE 5 G PO PACK
5.0000 g | PACK | Freq: Every day | ORAL | Status: DC
  Administered 2020-05-15 – 2020-05-17 (×3): 5 g via ORAL
  Filled 2020-05-15 (×4): qty 1

## 2020-05-15 MED ORDER — OXYCODONE HCL 5 MG PO TABS
5.0000 mg | ORAL_TABLET | ORAL | Status: DC | PRN
  Administered 2020-05-15 – 2020-06-10 (×69): 5 mg via ORAL
  Filled 2020-05-15 (×74): qty 1

## 2020-05-15 NOTE — Plan of Care (Signed)
  Problem: Pain Managment: Goal: General experience of comfort will improve Outcome: Not Progressing   

## 2020-05-15 NOTE — Progress Notes (Signed)
Patient ID: [name], male   DOB: 1991-06-24, 29 y.o.   MRN: [id number]         [facility]  Date of Admission:  05/10/2020   Total days of antibiotics 6        Day 3 daptomycin         ASSESSMENT: He has MRSA bacteremia and left knee infection.  There was no evidence of endocarditis on TEE.  I think the soft tissue infection around his left knee is improving.  Repeat blood cultures are negative at 3 days.  I plan on about 3 weeks total IV antibiotic therapy.  I will consider continuing daptomycin for 4 more days then giving him a dose of IV oritavancin prior to discharge.  PLAN: 1. Continue daptomycin 2. Await results of repeat blood cultures   Principal Problem:   MRSA bacteremia Active Problems:   Cellulitis of left lower extremity   IVDU (intravenous drug user)   Renal failure   Polysubstance abuse (HCC)   Sepsis due to cellulitis (HCC)   Hyponatremia   Scheduled Meds: . heparin  5,000 Units Subcutaneous Q8H  . senna-docusate  2 tablet Oral BID  . sodium zirconium cyclosilicate  5 g Oral Daily   Continuous Infusions: . sodium chloride    . DAPTOmycin (CUBICIN)  IV 560 mg (05/14/20 1938)   PRN Meds:.acetaminophen **OR** acetaminophen, HYDROmorphone (DILAUDID) injection, ondansetron **OR** ondansetron (ZOFRAN) IV, oxyCODONE   SUBJECTIVE: He says that he is not feeling any better.  He continues to feel pain all over, especially around his left knee and down into his left calf.  Review of Systems: Review of Systems  Constitutional: Negative for chills, diaphoresis and fever.  Musculoskeletal: Positive for joint pain.    No Known Allergies  OBJECTIVE: Vitals:   05/14/20 1437 05/14/20 2155 05/15/20 0501 05/15/20 0807  BP: 124/78 122/75 (!) 146/93 (!) 150/95  Pulse: 85 82 91 (!) 104  Resp: 18 18 18 18   Temp: 98.6 F (37 C) 98.5 F (36.9 C) 98.7 F (37.1 C) 99.6 F (37.6 C)  TempSrc: Oral Oral  Oral  SpO2: 98% 98% 99% 97%    Weight:      Height:       Body mass index is 22.4 kg/m.  Physical Exam Constitutional:      Comments: He was asleep when I entered the room but would arouse and shake his head yes or no to questions.  Cardiovascular:     Rate and Rhythm: Normal rate and regular rhythm.     Heart sounds: No murmur heard.   Pulmonary:     Effort: Pulmonary effort is normal.     Breath sounds: Normal breath sounds.  Abdominal:     Palpations: Abdomen is soft.     Tenderness: There is no abdominal tenderness.  Musculoskeletal:        General: Swelling and tenderness present.     Comments: He has abrasions and dried blood around his left knee.  His left knee is not as swollen or warm as it was.  His left calf is tender to palpation but has no unusual warmth, redness or swelling.     Lab Results Lab Results  Component Value Date   WBC 33.8 (H) 05/15/2020   HGB 12.2 (L) 05/15/2020   HCT 35.9 (L) 05/15/2020   MCV 84.3 05/15/2020   PLT 519 (H) 05/15/2020    Lab Results  Component Value Date   CREATININE 2.76 (H)  05/15/2020   BUN 54 (H) 05/15/2020   NA 126 (L) 05/15/2020   K 5.2 (H) 05/15/2020   CL 98 05/15/2020   CO2 20 (L) 05/15/2020    Lab Results  Component Value Date   ALT 26 05/10/2020   AST 25 05/10/2020   ALKPHOS 60 05/10/2020   BILITOT 0.3 05/10/2020     Microbiology: Recent Results (from the past 240 hour(s))  Blood Culture (routine x 2)     Status: Abnormal   Collection Time: 05/10/20  6:43 PM   Specimen: BLOOD RIGHT ARM  Result Value Ref Range Status   Specimen Description BLOOD RIGHT ARM  Final   Special Requests   Final    BOTTLES DRAWN AEROBIC AND ANAEROBIC Blood Culture adequate volume   Culture  Setup Time   Final    GRAM POSITIVE COCCI IN CLUSTERS IN BOTH AEROBIC AND ANAEROBIC BOTTLES CRITICAL RESULT CALLED TO, READ BACK BY AND VERIFIED WITH: Silvio Pate [facility] [id number] AT 1531 BY CM Performed at [facility], [address], [place], [place] [address]     Culture METHICILLIN RESISTANT STAPHYLOCOCCUS AUREUS (A)  Final   Report Status 05/13/2020 FINAL  Final   Organism ID, Bacteria METHICILLIN RESISTANT STAPHYLOCOCCUS AUREUS  Final      Susceptibility   Methicillin resistant staphylococcus aureus - MIC*    CIPROFLOXACIN <=0.5 SENSITIVE Sensitive     ERYTHROMYCIN >=8 RESISTANT Resistant     GENTAMICIN <=0.5 SENSITIVE Sensitive     OXACILLIN >=4 RESISTANT Resistant     TETRACYCLINE <=1 SENSITIVE Sensitive     VANCOMYCIN <=0.5 SENSITIVE Sensitive     TRIMETH/SULFA <=10 SENSITIVE Sensitive     CLINDAMYCIN >=8 RESISTANT Resistant     RIFAMPIN <=0.5 SENSITIVE Sensitive     Inducible Clindamycin NEGATIVE Sensitive     * METHICILLIN RESISTANT STAPHYLOCOCCUS AUREUS  Urine culture     Status: None   Collection Time: 05/10/20  6:43 PM   Specimen: In/Out Cath Urine  Result Value Ref Range Status   Specimen Description   Final    IN/OUT CATH URINE Performed at [facility], [address] [place], [place] [address]    Special Requests   Final    NONE Performed at [facility], [address], [place], [place] [address]    Culture   Final    NO GROWTH Performed at [facility], [address] [place], [place] [address]    Report Status 05/12/2020 FINAL  Final  Blood Culture ID Panel (Reflexed)     Status: Abnormal   Collection Time: 05/10/20  6:43 PM  Result Value Ref Range Status   Enterococcus faecalis NOT DETECTED NOT DETECTED Final   Enterococcus Faecium NOT DETECTED NOT DETECTED Final   Listeria monocytogenes NOT DETECTED NOT DETECTED Final   Staphylococcus species DETECTED (A) NOT DETECTED Final    Comment: CRITICAL RESULT CALLED TO, READ BACK BY AND VERIFIED WITH: PAHRMD J WAFFORD [id number] AT 1531 BY CM    Staphylococcus aureus (BCID) DETECTED (A) NOT DETECTED Final    Comment: Methicillin (oxacillin)-resistant Staphylococcus aureus (MRSA). MRSA is predictably resistant to beta-lactam  antibiotics (except ceftaroline). Preferred therapy is vancomycin unless clinically contraindicated. Patient requires contact precautions if  hospitalized. CRITICAL RESULT CALLED TO, READ BACK BY AND VERIFIED WITH: PHARMD J WAFFORD [id number] AT 1531 BY CM    Staphylococcus epidermidis NOT DETECTED NOT DETECTED Final   Staphylococcus lugdunensis NOT DETECTED NOT DETECTED Final   Streptococcus  species NOT DETECTED NOT DETECTED Final   Streptococcus agalactiae NOT DETECTED NOT DETECTED Final   Streptococcus pneumoniae NOT DETECTED NOT DETECTED Final   Streptococcus pyogenes NOT DETECTED NOT DETECTED Final   A.calcoaceticus-baumannii NOT DETECTED NOT DETECTED Final   Bacteroides fragilis NOT DETECTED NOT DETECTED Final   Enterobacterales NOT DETECTED NOT DETECTED Final   Enterobacter cloacae complex NOT DETECTED NOT DETECTED Final   Escherichia coli NOT DETECTED NOT DETECTED Final   Klebsiella aerogenes NOT DETECTED NOT DETECTED Final   Klebsiella oxytoca NOT DETECTED NOT DETECTED Final   Klebsiella pneumoniae NOT DETECTED NOT DETECTED Final   Proteus species NOT DETECTED NOT DETECTED Final   Salmonella species NOT DETECTED NOT DETECTED Final   Serratia marcescens NOT DETECTED NOT DETECTED Final   Haemophilus influenzae NOT DETECTED NOT DETECTED Final   Neisseria meningitidis NOT DETECTED NOT DETECTED Final   Pseudomonas aeruginosa NOT DETECTED NOT DETECTED Final   Stenotrophomonas maltophilia NOT DETECTED NOT DETECTED Final   Candida albicans NOT DETECTED NOT DETECTED Final   Candida auris NOT DETECTED NOT DETECTED Final   Candida glabrata NOT DETECTED NOT DETECTED Final   Candida krusei NOT DETECTED NOT DETECTED Final   Candida parapsilosis NOT DETECTED NOT DETECTED Final   Candida tropicalis NOT DETECTED NOT DETECTED Final   Cryptococcus neoformans/gattii NOT DETECTED NOT DETECTED Final   Meth resistant mecA/C and MREJ DETECTED (A) NOT DETECTED Final    Comment: CRITICAL RESULT CALLED  TO, READ BACK BY AND VERIFIED WITH: Dorann LodgeHARMD J [facility]FF[facility] [id number] AT 1531 BY CM Performed at [facility], [address] [place], [place] [address]   Blood Culture (routine x 2)     Status: Abnormal   Collection Time: 05/10/20  6:48 PM   Specimen: BLOOD  Result Value Ref Range Status   Specimen Description   Final    BLOOD RIGHT ANTECUBITAL Performed at [facility], [address] [place], [place] [address]    Special Requests   Final    BOTTLES DRAWN AEROBIC AND ANAEROBIC Blood Culture adequate volume Performed at [facility], [address] [place], [place] [address]    Culture  Setup Time   Final    GRAM POSITIVE COCCI IN CLUSTERS IN BOTH AEROBIC AND ANAEROBIC BOTTLES CRITICAL VALUE NOTED.  VALUE IS CONSISTENT WITH PREVIOUSLY REPORTED AND CALLED VALUE.    Culture (A)  Final    STAPHYLOCOCCUS AUREUS SUSCEPTIBILITIES PERFORMED ON PREVIOUS CULTURE WITHIN THE LAST 5 DAYS. Performed at [facility], [address] [place], [place] [address]    Report Status 05/13/2020 FINAL  Final  SARS Coronavirus 2 by RT PCR (hospital order, performed in [facility] lab) Nasopharyngeal Nasopharyngeal Swab     Status: None   Collection Time: 05/10/20  7:25 PM   Specimen: Nasopharyngeal Swab  Result Value Ref Range Status   SARS Coronavirus 2 NEGATIVE NEGATIVE Final    Comment: (NOTE) SARS-CoV-2 target nucleic acids are NOT DETECTED.  The SARS-CoV-2 RNA is generally detectable in upper and lower respiratory specimens during the acute phase of infection. The lowest concentration of SARS-CoV-2 viral copies this assay can detect is 250 copies / mL. A negative result does not preclude SARS-CoV-2 infection and should not be used as the sole basis for treatment or other patient management decisions.  A negative result may occur with improper specimen collection / handling, submission of specimen other than nasopharyngeal swab, presence of  viral mutation(s) within the areas targeted by  this assay, and inadequate number of viral copies (<250 copies / mL). A negative result must be combined with clinical observations, patient history, and epidemiological information.  Fact Sheet for Patients:   BoilerBrush.com.cy  Fact Sheet for Healthcare Providers: https://pope.com/  This test is not yet approved or  cleared by the [place] FDA and has been authorized for detection and/or diagnosis of SARS-CoV-2 by FDA under an Emergency Use Authorization (EUA).  This EUA will remain in effect (meaning this test can be used) for the duration of the COVID-19 declaration under Section 564(b)(1) of the Act, 21 U.S.C. section 360bbb-3(b)(1), unless the authorization is terminated or revoked sooner.  Performed at  Hospital, [address] [place], [place] [address]   Culture, blood (Routine X 2) w Reflex to ID Panel     Status: None (Preliminary result)   Collection Time: 05/12/20  8:55 AM   Specimen: BLOOD LEFT ARM  Result Value Ref Range Status   Specimen Description BLOOD LEFT ARM  Final   Special Requests   Final    BOTTLES DRAWN AEROBIC ONLY Blood Culture results may not be optimal due to an inadequate volume of blood received in culture bottles   Culture   Final    NO GROWTH 3 DAYS Performed at [facility], [address], [place], [place] [address]    Report Status PENDING  Incomplete  Culture, blood (Routine X 2) w Reflex to ID Panel     Status: None (Preliminary result)   Collection Time: 05/12/20  8:55 AM   Specimen: BLOOD LEFT ARM  Result Value Ref Range Status   Specimen Description BLOOD LEFT ARM  Final   Special Requests   Final    BOTTLES DRAWN AEROBIC ONLY Blood Culture results may not be optimal due to an inadequate volume of blood received in culture bottles   Culture   Final    NO GROWTH 3 DAYS Performed at [facility],  [address] [place], [place] [address]    Report Status PENDING  Incomplete    Cliffton Asters, MD [facility] [phone] pager   [phone] cell 05/15/2020, 12:11 PM

## 2020-05-15 NOTE — Progress Notes (Signed)
Patient refused to go to radiology for DG  of the ribs. Would like to do it in the morning. Educated patient of the importance. Adamant not to go.
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
## 2020-05-16 ENCOUNTER — Inpatient Hospital Stay (HOSPITAL_COMMUNITY): Payer: Self-pay

## 2020-05-16 LAB — OSMOLALITY, URINE: Osmolality, Ur: 310 mOsm/kg (ref 300–900)

## 2020-05-16 LAB — OSMOLALITY: Osmolality: 281 mOsm/kg (ref 275–295)

## 2020-05-16 LAB — BASIC METABOLIC PANEL
Anion gap: 9 (ref 5–15)
BUN: 59 mg/dL — ABNORMAL HIGH (ref 6–20)
CO2: 18 mmol/L — ABNORMAL LOW (ref 22–32)
Calcium: 7.5 mg/dL — ABNORMAL LOW (ref 8.9–10.3)
Chloride: 96 mmol/L — ABNORMAL LOW (ref 98–111)
Creatinine, Ser: 2.52 mg/dL — ABNORMAL HIGH (ref 0.61–1.24)
GFR calc Af Amer: 38 mL/min — ABNORMAL LOW (ref >60)
GFR calc non Af Amer: 33 mL/min — ABNORMAL LOW (ref >60)
Glucose, Bld: 107 mg/dL — ABNORMAL HIGH (ref 70–99)
Potassium: 5.8 mmol/L — ABNORMAL HIGH (ref 3.5–5.1)
Sodium: 123 mmol/L — ABNORMAL LOW (ref 135–145)

## 2020-05-16 LAB — CK: Total CK: 24 U/L — ABNORMAL LOW (ref 49–397)

## 2020-05-16 LAB — CBC
HCT: 35 % — ABNORMAL LOW (ref 39.0–52.0)
Hemoglobin: 11.7 g/dL — ABNORMAL LOW (ref 13.0–17.0)
MCH: 28.1 pg (ref 26.0–34.0)
MCHC: 33.4 g/dL (ref 30.0–36.0)
MCV: 83.9 fL (ref 80.0–100.0)
Platelets: 440 10*3/uL — ABNORMAL HIGH (ref 150–400)
RBC: 4.17 MIL/uL — ABNORMAL LOW (ref 4.22–5.81)
RDW: 12.9 % (ref 11.5–15.5)
WBC: 54.3 10*3/uL (ref 4.0–10.5)
nRBC: 0 % (ref 0.0–0.2)

## 2020-05-16 LAB — URINALYSIS, ROUTINE W REFLEX MICROSCOPIC
Bilirubin Urine: NEGATIVE
Glucose, UA: NEGATIVE mg/dL
Ketones, ur: NEGATIVE mg/dL
Leukocytes,Ua: NEGATIVE
Nitrite: NEGATIVE
Protein, ur: 100 mg/dL — AB
Specific Gravity, Urine: 1.01 (ref 1.005–1.030)
pH: 5 (ref 5.0–8.0)

## 2020-05-16 LAB — RENAL FUNCTION PANEL
Albumin: 1.2 g/dL — ABNORMAL LOW (ref 3.5–5.0)
Anion gap: 13 (ref 5–15)
BUN: 57 mg/dL — ABNORMAL HIGH (ref 6–20)
CO2: 16 mmol/L — ABNORMAL LOW (ref 22–32)
Calcium: 7.6 mg/dL — ABNORMAL LOW (ref 8.9–10.3)
Chloride: 95 mmol/L — ABNORMAL LOW (ref 98–111)
Creatinine, Ser: 2.55 mg/dL — ABNORMAL HIGH (ref 0.61–1.24)
GFR calc Af Amer: 38 mL/min — ABNORMAL LOW (ref >60)
GFR calc non Af Amer: 33 mL/min — ABNORMAL LOW (ref >60)
Glucose, Bld: 110 mg/dL — ABNORMAL HIGH (ref 70–99)
Phosphorus: 6.1 mg/dL — ABNORMAL HIGH (ref 2.5–4.6)
Potassium: 5.4 mmol/L — ABNORMAL HIGH (ref 3.5–5.1)
Sodium: 124 mmol/L — ABNORMAL LOW (ref 135–145)

## 2020-05-16 LAB — SODIUM, URINE, RANDOM: Sodium, Ur: <10 mmol/L

## 2020-05-16 MED ORDER — NEPRO/CARBSTEADY PO LIQD
237.0000 mL | Freq: Three times a day (TID) | ORAL | Status: DC
  Administered 2020-05-16 – 2020-05-21 (×11): 237 mL via ORAL

## 2020-05-16 MED ORDER — ORITAVANCIN DIPHOSPHATE 400 MG IV SOLR: 1200.0000 mg | Freq: Once | INTRAVENOUS | Status: DC

## 2020-05-16 MED ORDER — PHENOL 1.4 % MT LIQD
1.0000 | OROMUCOSAL | Status: DC | PRN
  Filled 2020-05-16: qty 177

## 2020-05-16 MED ORDER — LIDOCAINE 5 % EX PTCH
1.0000 | MEDICATED_PATCH | CUTANEOUS | Status: DC
  Administered 2020-05-16 – 2020-06-19 (×28): 1 via TRANSDERMAL
  Filled 2020-05-16 (×38): qty 1

## 2020-05-16 MED ORDER — SODIUM BICARBONATE 650 MG PO TABS
650.0000 mg | ORAL_TABLET | Freq: Two times a day (BID) | ORAL | Status: AC
  Administered 2020-05-16 – 2020-05-17 (×2): 650 mg via ORAL
  Filled 2020-05-16 (×2): qty 1

## 2020-05-16 MED ORDER — ORITAVANCIN DIPHOSPHATE 400 MG IV SOLR
1200.0000 mg | Freq: Once | INTRAVENOUS | Status: DC
  Filled 2020-05-16: qty 120

## 2020-05-16 MED ORDER — HYDROMORPHONE HCL 1 MG/ML IJ SOLN
1.0000 mg | Freq: Once | INTRAMUSCULAR | Status: AC
  Administered 2020-05-16 – 2020-05-17 (×2): 1 mg via INTRAVENOUS

## 2020-05-16 MED ORDER — METHOCARBAMOL 500 MG PO TABS
500.0000 mg | ORAL_TABLET | Freq: Three times a day (TID) | ORAL | Status: DC
  Administered 2020-05-16 – 2020-06-11 (×77): 500 mg via ORAL
  Filled 2020-05-16 (×77): qty 1
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
## 2020-05-16 NOTE — Plan of Care (Signed)
  Problem: Education: Goal: Knowledge of General Education information will improve Description Including pain rating scale, medication(s)/side effects and non-pharmacologic comfort measures Outcome: Progressing   

## 2020-05-16 NOTE — Progress Notes (Signed)
Patient was seen for red MEWS (HR and RR).    He complains of severe left shoulder and leg pain. He denies SOB but reports difficulty taking deep breath due to shoulder pain.   He appears uncomfortable, not in acute distress and no pallor or diaphoresis. HR 130, RR 22, sat 90s on rm air, BP 165/95.   Plan to give one-time dose IV Dilaudid 1mg , incentive spirometry, continue close monitoring.
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
## 2020-05-16 NOTE — Progress Notes (Signed)
Transferred to 3E07 via bed.

## 2020-05-16 NOTE — Progress Notes (Signed)
Pharmacy Antibiotic Note  [name] is a 29 y.o. male admitted on 05/10/2020 with MRSA bacteremia and left knee infection.  Pharmacy has been consulted for daptomycin dosing.  TTE with thin mobile density below tricuspid valve but unclear if vegetation or valve. No evidence of septic emboli. Patient's renal function significantly improving but still not at baseline. Patient is afebrile, WBC has jumped to 54.3. ID following. CK 72 prior to starting Dapto> down to 24 today. Plan for total of 3 weeks of IV antibiotic therapy.  Plan: Continue daptomycin 560mg  (8mg /kg) IV q24h >> 9/27, then plan to give oritavancin x 1 on 9/27 Monitor C&S, renal function, clinical improvement and results of TEE for LOT Monitor CK weekly while on daptomycin  Height: 5\' 10"  (177.8 cm) Weight: 70.8 kg (156 lb 1.4 oz) IBW/kg (Calculated) : 73  Temp (24hrs), Avg:98.4 F (36.9 C), Min:98.2 F (36.8 C), Max:98.7 F (37.1 C)   Recent Labs  Lab 05/10/20 1843 05/10/20 1925 05/10/20 1937 05/12/20 0855 05/13/20 0356 05/14/20 0534 05/15/20 1003 05/16/20 0905  WBC 17.6*  --    < > 16.7* 17.1* 19.8* 33.8* 54.3*  CREATININE 10.19*  --    < > 4.20* 2.79* 2.80* 2.76* 2.55*  LATICACIDVEN 0.9 1.1  --   --   --   --   --   --    < > = values in this interval not displayed.    Estimated Creatinine Clearance: 42.8 mL/min (A) (by C-G formula based on SCr of 2.55 mg/dL (H)).    No Known Allergies  Antimicrobials this admission: Vancomycin 9/18 x1 Ceftriaxone 9/18 x1 Linezolid 9/19>9/21 Daptomycin 9/21 >>   Dose adjustments this admission: N/A  Microbiology results: 9/18 Bcx: MRSA 4o4 bottles 9/18 UCx: neg 9/20 BCx: sent   Thank you for allowing 10/18 to participate in this patients care.   10/18, PharmD Please see amion for complete clinical pharmacist phone list. 05/16/2020 3:36 PM

## 2020-05-16 NOTE — Progress Notes (Signed)
Pt to transfer to [facility], waiting for bed to be ready.

## 2020-05-16 NOTE — Progress Notes (Signed)
CRITICAL VALUE STICKER  CRITICAL VALUE: WBC=54.3 RECEIVER (on-site recipient of call): Nelma Rothman, RN   DATE & TIME NOTIFIED: 05/16/20   MESSENGER (representative from lab):  MD NOTIFIED: MD Benjamine Mola   TIME OF NOTIFICATION:1048  RESPONSE: No new orders at this time
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
## 2020-05-16 NOTE — Progress Notes (Signed)
Notified MD Vann via secure chat that pt refused rib xray. Pt has refused since last night due to pain. Pt has received PRN meds (see MAR)

## 2020-05-16 NOTE — Progress Notes (Signed)
   05/16/20 2300  Assess: MEWS Score  Temp 99.7 F (37.6 C)  BP (!) 165/95  Pulse Rate (!) 118  ECG Heart Rate (!) 124  Resp (!) 26  SpO2 99 %  Assess: MEWS Score  MEWS Temp 0  MEWS Systolic 0  MEWS Pulse 2  MEWS RR 2  MEWS LOC 0  MEWS Score 4  MEWS Score Color Red  Assess: if the MEWS score is Yellow or Red  Were vital signs taken at a resting state? Yes  Focused Assessment No change from prior assessment  Treat  MEWS Interventions Administered scheduled meds/treatments  Pain Scale 0-10  Pain Score 10  Faces Pain Scale 2  Pain Type Acute pain  Pain Location Generalized  Pain Orientation Left  Pain Radiating Towards  (Left Shoulder)  Pain Descriptors / Indicators Aching  Pain Frequency Constant  Pain Onset On-going  Patients Stated Pain Goal 4  Pain Intervention(s) Medication (See eMAR)  Multiple Pain Sites No  Breathing 0  Negative Vocalization 0  Facial Expression 0  Body Language 0  Consolability 0  PAINAD Score 0  Patients response to intervention Relief  Take Vital Signs  Increase Vital Sign Frequency  Red: Q 1hr X 4 then Q 4hr X 4, if remains red, continue Q 4hrs  Escalate  MEWS: Escalate Red: discuss with charge nurse/RN and provider, consider discussing with RRT  Notify: Charge Nurse/RN  Name of Charge Nurse/RN Notified Lyn RN  Date Charge Nurse/RN Notified 05/16/20  Time Charge Nurse/RN Notified 2304  Notify: Provider  Provider Name/Title Dr Antionette Char MD  Date Provider Notified 05/16/20  Time Provider Notified 2314  Notification Type Page  Notification Reason Other (Comment) (MEWS score)  Notify: Rapid Response  Name of Rapid Response RN Notified David RN  Date Rapid Response Notified 05/16/20  Time Rapid Response Notified 2311

## 2020-05-16 NOTE — Progress Notes (Signed)
Patient ID: Craig RogueJoseph G Schneider, male   DOB: 1990/09/03, 29 y.o.   MRN: [id number]         [facility]  Date of Admission:  05/10/2020   Total days of antibiotics 7        Day 4 daptomycin         ASSESSMENT: He has MRSA bacteremia and left knee soft tissue infection.  There was no evidence of endocarditis on TEE.  I think the soft tissue infection around his left knee is improving.  Repeat blood cultures are negative.  I plan on about 3 weeks total IV antibiotic therapy.  I recommend continuing daptomycin through the weekend then giving a dose of long-acting oritavancin on Monday, 05/19/2020 to complete therapy.  Those orders have been placed.  He has chronic hepatitis C, genotype 3.  His viral load is 11,700,000.  He was not aware that he had hepatitis.  I told him that I will arrange follow-up in my clinic and we can discuss curative treatment.  PLAN: 1. Continue daptomycin 2. Await results of repeat blood cultures  3. I have scheduled follow-up for him in my clinic on 06/19/2020 4. I will sign off now  Principal Problem:   MRSA bacteremia Active Problems:   Cellulitis of left lower extremity   IVDU (intravenous drug user)   Renal failure   Polysubstance abuse (HCC)   Sepsis due to cellulitis (HCC)   Hyponatremia   Scheduled Meds: . heparin  5,000 Units Subcutaneous Q8H  . lidocaine  1 patch Transdermal Q24H  . methocarbamol  500 mg Oral TID  . senna-docusate  2 tablet Oral BID  . sodium zirconium cyclosilicate  5 g Oral Daily   Continuous Infusions: . sodium chloride 100 mL/hr at 05/16/20 1038  . DAPTOmycin (CUBICIN)  IV 560 mg (05/15/20 2013)  . [START ON 05/19/2020] oritavancin (ORBACTIV) IVPB     PRN Meds:.acetaminophen **OR** acetaminophen, HYDROmorphone (DILAUDID) injection, ondansetron **OR** ondansetron (ZOFRAN) IV, oxyCODONE, phenol   SUBJECTIVE: He says that he is not feeling any better.  He continues to feel pain all over, especially  around his left knee and down into his left calf.  Review of Systems: Review of Systems  Constitutional: Negative for chills, diaphoresis and fever.  Musculoskeletal: Positive for joint pain.    No Known Allergies  OBJECTIVE: Vitals:   05/15/20 1704 05/15/20 2051 05/16/20 0545 05/16/20 0843  BP: (!) 156/99 (!) 157/98 (!) 148/88 (!) 152/95  Pulse: 98 95 88 98  Resp: 18 20 18 18   Temp: 98.4 F (36.9 C) 98.2 F (36.8 C) 98.7 F (37.1 C) 98.2 F (36.8 C)  TempSrc: Oral  Oral Oral  SpO2: 97% 97% 97% 96%  Weight:      Height:       Body mass index is 22.4 kg/m.  Physical Exam Constitutional:      Comments: He was asleep when I entered the room but would arouse and shake his head yes or no to questions.  Cardiovascular:     Rate and Rhythm: Normal rate and regular rhythm.     Heart sounds: No murmur heard.   Pulmonary:     Effort: Pulmonary effort is normal.     Breath sounds: Normal breath sounds.  Abdominal:     Palpations: Abdomen is soft.     Tenderness: There is no abdominal tenderness.  Musculoskeletal:        General: Swelling and tenderness present.     Comments:  He has abrasions and dried blood around his left knee.  His left knee is not as swollen or warm as it was.  His left calf is tender to palpation but has no unusual warmth, redness or swelling.     Lab Results Lab Results  Component Value Date   WBC 54.3 (HH) 05/16/2020   HGB 11.7 (L) 05/16/2020   HCT 35.0 (L) 05/16/2020   MCV 83.9 05/16/2020   PLT 440 (H) 05/16/2020    Lab Results  Component Value Date   CREATININE 2.55 (H) 05/16/2020   BUN 57 (H) 05/16/2020   NA 124 (L) 05/16/2020   K 5.4 (H) 05/16/2020   CL 95 (L) 05/16/2020   CO2 16 (L) 05/16/2020    Lab Results  Component Value Date   ALT 26 05/10/2020   AST 25 05/10/2020   ALKPHOS 60 05/10/2020   BILITOT 0.3 05/10/2020     Microbiology: Recent Results (from the past 240 hour(s))  Blood Culture (routine x 2)     Status:  Abnormal   Collection Time: 05/10/20  6:43 PM   Specimen: BLOOD RIGHT ARM  Result Value Ref Range Status   Specimen Description BLOOD RIGHT ARM  Final   Special Requests   Final    BOTTLES DRAWN AEROBIC AND ANAEROBIC Blood Culture adequate volume   Culture  Setup Time   Final    GRAM POSITIVE COCCI IN CLUSTERS IN BOTH AEROBIC AND ANAEROBIC BOTTLES CRITICAL RESULT CALLED TO, READ BACK BY AND VERIFIED WITH: Silvio Pate [facility] [id number] AT 1531 BY CM Performed at [facility], [address], [place], [place] [address]    Culture METHICILLIN RESISTANT STAPHYLOCOCCUS AUREUS (A)  Final   Report Status 05/13/2020 FINAL  Final   Organism ID, Bacteria METHICILLIN RESISTANT STAPHYLOCOCCUS AUREUS  Final      Susceptibility   Methicillin resistant staphylococcus aureus - MIC*    CIPROFLOXACIN <=0.5 SENSITIVE Sensitive     ERYTHROMYCIN >=8 RESISTANT Resistant     GENTAMICIN <=0.5 SENSITIVE Sensitive     OXACILLIN >=4 RESISTANT Resistant     TETRACYCLINE <=1 SENSITIVE Sensitive     VANCOMYCIN <=0.5 SENSITIVE Sensitive     TRIMETH/SULFA <=10 SENSITIVE Sensitive     CLINDAMYCIN >=8 RESISTANT Resistant     RIFAMPIN <=0.5 SENSITIVE Sensitive     Inducible Clindamycin NEGATIVE Sensitive     * METHICILLIN RESISTANT STAPHYLOCOCCUS AUREUS  Urine culture     Status: None   Collection Time: 05/10/20  6:43 PM   Specimen: In/Out Cath Urine  Result Value Ref Range Status   Specimen Description   Final    IN/OUT CATH URINE Performed at [facility], [address] [place], [place] [address]    Special Requests   Final    NONE Performed at [facility], [address], [place], [place] [address]    Culture   Final    NO GROWTH Performed at [facility], [address] [place], [place] [address]    Report Status 05/12/2020 FINAL  Final  Blood Culture ID Panel (Reflexed)     Status: Abnormal   Collection Time: 05/10/20  6:43 PM  Result Value Ref Range  Status   Enterococcus faecalis NOT DETECTED NOT DETECTED Final   Enterococcus Faecium NOT DETECTED NOT DETECTED Final   Listeria monocytogenes NOT DETECTED NOT DETECTED Final   Staphylococcus species DETECTED (A) NOT DETECTED Final    Comment: CRITICAL RESULT CALLED TO, READ BACK  BY AND VERIFIED WITH: PAHRMD J WAFFORD [id number] AT 1531 BY CM    Staphylococcus aureus (BCID) DETECTED (A) NOT DETECTED Final    Comment: Methicillin (oxacillin)-resistant Staphylococcus aureus (MRSA). MRSA is predictably resistant to beta-lactam antibiotics (except ceftaroline). Preferred therapy is vancomycin unless clinically contraindicated. Patient requires contact precautions if  hospitalized. CRITICAL RESULT CALLED TO, READ BACK BY AND VERIFIED WITH: PHARMD J WAFFORD [id number] AT 1531 BY CM    Staphylococcus epidermidis NOT DETECTED NOT DETECTED Final   Staphylococcus lugdunensis NOT DETECTED NOT DETECTED Final   Streptococcus species NOT DETECTED NOT DETECTED Final   Streptococcus agalactiae NOT DETECTED NOT DETECTED Final   Streptococcus pneumoniae NOT DETECTED NOT DETECTED Final   Streptococcus pyogenes NOT DETECTED NOT DETECTED Final   A.calcoaceticus-baumannii NOT DETECTED NOT DETECTED Final   Bacteroides fragilis NOT DETECTED NOT DETECTED Final   Enterobacterales NOT DETECTED NOT DETECTED Final   Enterobacter cloacae complex NOT DETECTED NOT DETECTED Final   Escherichia coli NOT DETECTED NOT DETECTED Final   Klebsiella aerogenes NOT DETECTED NOT DETECTED Final   Klebsiella oxytoca NOT DETECTED NOT DETECTED Final   Klebsiella pneumoniae NOT DETECTED NOT DETECTED Final   Proteus species NOT DETECTED NOT DETECTED Final   Salmonella species NOT DETECTED NOT DETECTED Final   Serratia marcescens NOT DETECTED NOT DETECTED Final   Haemophilus influenzae NOT DETECTED NOT DETECTED Final   Neisseria meningitidis NOT DETECTED NOT DETECTED Final   Pseudomonas aeruginosa NOT DETECTED NOT DETECTED Final    Stenotrophomonas maltophilia NOT DETECTED NOT DETECTED Final   Candida albicans NOT DETECTED NOT DETECTED Final   Candida auris NOT DETECTED NOT DETECTED Final   Candida glabrata NOT DETECTED NOT DETECTED Final   Candida krusei NOT DETECTED NOT DETECTED Final   Candida parapsilosis NOT DETECTED NOT DETECTED Final   Candida tropicalis NOT DETECTED NOT DETECTED Final   Cryptococcus neoformans/gattii NOT DETECTED NOT DETECTED Final   Meth resistant mecA/C and MREJ DETECTED (A) NOT DETECTED Final    Comment: CRITICAL RESULT CALLED TO, READ BACK BY AND VERIFIED WITH: Dorann Lodge [facility] [id number] AT 1531 BY CM Performed at [facility], [address], [place], [place] [address]   Blood Culture (routine x 2)     Status: Abnormal   Collection Time: 05/10/20  6:48 PM   Specimen: BLOOD  Result Value Ref Range Status   Specimen Description   Final    BLOOD RIGHT ANTECUBITAL Performed at [facility], [address] [place], [place] [address]    Special Requests   Final    BOTTLES DRAWN AEROBIC AND ANAEROBIC Blood Culture adequate volume Performed at [facility], [address] [place], [place] [address]    Culture  Setup Time   Final    GRAM POSITIVE COCCI IN CLUSTERS IN BOTH AEROBIC AND ANAEROBIC BOTTLES CRITICAL VALUE NOTED.  VALUE IS CONSISTENT WITH PREVIOUSLY REPORTED AND CALLED VALUE.    Culture (A)  Final    STAPHYLOCOCCUS AUREUS SUSCEPTIBILITIES PERFORMED ON PREVIOUS CULTURE WITHIN THE LAST 5 DAYS. Performed at [facility], [address], [place], [place] [address]    Report Status 05/13/2020 FINAL  Final  SARS Coronavirus 2 by RT PCR (hospital order, performed in [facility] hospital lab) Nasopharyngeal Nasopharyngeal Swab     Status: None   Collection Time: 05/10/20  7:25 PM   Specimen: Nasopharyngeal Swab  Result Value Ref Range Status   SARS Coronavirus 2 NEGATIVE NEGATIVE Final    Comment: (NOTE)  SARS-CoV-2 target nucleic  acids are NOT DETECTED.  The SARS-CoV-2 RNA is generally detectable in upper and lower respiratory specimens during the acute phase of infection. The lowest concentration of SARS-CoV-2 viral copies this assay can detect is 250 copies / mL. A negative result does not preclude SARS-CoV-2 infection and should not be used as the sole basis for treatment or other patient management decisions.  A negative result may occur with improper specimen collection / handling, submission of specimen other than nasopharyngeal swab, presence of viral mutation(s) within the areas targeted by this assay, and inadequate number of viral copies (<250 copies / mL). A negative result must be combined with clinical observations, patient history, and epidemiological information.  Fact Sheet for Patients:   BoilerBrush.com.cy  Fact Sheet for Healthcare Providers: https://pope.com/  This test is not yet approved or  cleared by the [place] FDA and has been authorized for detection and/or diagnosis of SARS-CoV-2 by FDA under an Emergency Use Authorization (EUA).  This EUA will remain in effect (meaning this test can be used) for the duration of the COVID-19 declaration under Section 564(b)(1) of the Act, 21 U.S.C. section 360bbb-3(b)(1), unless the authorization is terminated or revoked sooner.  Performed at [facility], [address], [place], [place] [address]   Culture, blood (Routine X 2) w Reflex to ID Panel     Status: None (Preliminary result)   Collection Time: 05/12/20  8:55 AM   Specimen: BLOOD LEFT ARM  Result Value Ref Range Status   Specimen Description BLOOD LEFT ARM  Final   Special Requests   Final    BOTTLES DRAWN AEROBIC ONLY Blood Culture results may not be optimal due to an inadequate volume of blood received in culture bottles   Culture   Final    NO GROWTH 3 DAYS Performed at [facility], [address] [place], [place] [address]    Report Status PENDING  Incomplete  Culture, blood (Routine X 2) w Reflex to ID Panel     Status: None (Preliminary result)   Collection Time: 05/12/20  8:55 AM   Specimen: BLOOD LEFT ARM  Result Value Ref Range Status   Specimen Description BLOOD LEFT ARM  Final   Special Requests   Final    BOTTLES DRAWN AEROBIC ONLY Blood Culture results may not be optimal due to an inadequate volume of blood received in culture bottles   Culture   Final    NO GROWTH 3 DAYS Performed at [facility], [address], [place], [place] [address]    Report Status PENDING  Incomplete    Cliffton Asters, MD [facility] [phone] pager   [phone] cell 05/16/2020, 1:45 PM

## 2020-05-17 ENCOUNTER — Inpatient Hospital Stay (HOSPITAL_COMMUNITY): Payer: Self-pay

## 2020-05-17 LAB — BASIC METABOLIC PANEL
Anion gap: 9 (ref 5–15)
Anion gap: 10 (ref 5–15)
BUN: 63 mg/dL — ABNORMAL HIGH (ref 6–20)
BUN: 63 mg/dL — ABNORMAL HIGH (ref 6–20)
CO2: 19 mmol/L — ABNORMAL LOW (ref 22–32)
CO2: 18 mmol/L — ABNORMAL LOW (ref 22–32)
Calcium: 7.6 mg/dL — ABNORMAL LOW (ref 8.9–10.3)
Calcium: 7.5 mg/dL — ABNORMAL LOW (ref 8.9–10.3)
Chloride: 97 mmol/L — ABNORMAL LOW (ref 98–111)
Chloride: 96 mmol/L — ABNORMAL LOW (ref 98–111)
Creatinine, Ser: 2.36 mg/dL — ABNORMAL HIGH (ref 0.61–1.24)
Creatinine, Ser: 2.08 mg/dL — ABNORMAL HIGH (ref 0.61–1.24)
GFR calc Af Amer: 42 mL/min — ABNORMAL LOW (ref >60)
GFR calc Af Amer: 48 mL/min — ABNORMAL LOW (ref >60)
GFR calc non Af Amer: 36 mL/min — ABNORMAL LOW (ref >60)
GFR calc non Af Amer: 42 mL/min — ABNORMAL LOW (ref >60)
Glucose, Bld: 92 mg/dL (ref 70–99)
Glucose, Bld: 96 mg/dL (ref 70–99)
Potassium: 5.4 mmol/L — ABNORMAL HIGH (ref 3.5–5.1)
Potassium: 5.8 mmol/L — ABNORMAL HIGH (ref 3.5–5.1)
Sodium: 125 mmol/L — ABNORMAL LOW (ref 135–145)
Sodium: 124 mmol/L — ABNORMAL LOW (ref 135–145)

## 2020-05-17 LAB — CBC WITH DIFFERENTIAL/PLATELET
Abs Immature Granulocytes: 0 10*3/uL (ref 0.00–0.07)
Basophils Absolute: 0 10*3/uL (ref 0.0–0.1)
Basophils Relative: 0 %
Eosinophils Absolute: 0 10*3/uL (ref 0.0–0.5)
Eosinophils Relative: 0 %
HCT: 33.9 % — ABNORMAL LOW (ref 39.0–52.0)
Hemoglobin: 11.6 g/dL — ABNORMAL LOW (ref 13.0–17.0)
Lymphocytes Relative: 0 %
Lymphs Abs: 0 10*3/uL — ABNORMAL LOW (ref 0.7–4.0)
MCH: 28.5 pg (ref 26.0–34.0)
MCHC: 34.2 g/dL (ref 30.0–36.0)
MCV: 83.3 fL (ref 80.0–100.0)
Monocytes Absolute: 1.7 10*3/uL — ABNORMAL HIGH (ref 0.1–1.0)
Monocytes Relative: 3 %
Neutro Abs: 55.2 10*3/uL — ABNORMAL HIGH (ref 1.7–7.7)
Neutrophils Relative %: 97 %
Platelets: 447 10*3/uL — ABNORMAL HIGH (ref 150–400)
RBC: 4.07 MIL/uL — ABNORMAL LOW (ref 4.22–5.81)
RDW: 13 % (ref 11.5–15.5)
WBC: 56.9 10*3/uL (ref 4.0–10.5)
nRBC: 0 % (ref 0.0–0.2)
nRBC: 0 /100 WBC

## 2020-05-17 LAB — CK: Total CK: 18 U/L — ABNORMAL LOW (ref 49–397)

## 2020-05-17 LAB — CBC
HCT: 33.2 % — ABNORMAL LOW (ref 39.0–52.0)
Hemoglobin: 11 g/dL — ABNORMAL LOW (ref 13.0–17.0)
MCH: 27.7 pg (ref 26.0–34.0)
MCHC: 33.1 g/dL (ref 30.0–36.0)
MCV: 83.6 fL (ref 80.0–100.0)
Platelets: 458 10*3/uL — ABNORMAL HIGH (ref 150–400)
RBC: 3.97 MIL/uL — ABNORMAL LOW (ref 4.22–5.81)
RDW: 13.1 % (ref 11.5–15.5)
WBC: 54.1 10*3/uL (ref 4.0–10.5)
nRBC: 0 % (ref 0.0–0.2)

## 2020-05-17 LAB — CULTURE, BLOOD (ROUTINE X 2)
Culture: NO GROWTH
Culture: NO GROWTH

## 2020-05-17 IMAGING — MR MR [PERSON_NAME] LOW WO/W CM*L*
5 of 19 series · 14 of 40 positions shown · IV contrast (G GG)
Comparison: Prior study [DATE]

CLINICAL DATA: Left lower extremity pain and swelling. Worsening
symptoms since prior MRI.

EXAM:
MRI OF LOWER LEFT EXTREMITY WITHOUT AND WITH CONTRAST; MRI OF THE
LEFT FOREFOOT WITHOUT AND WITH CONTRAST
TECHNIQUE: Multiplanar, multisequence MR imaging of the left lower extremity
was performed both before and after administration of intravenous
contrast.
CONTRAST:  7.5mL GADAVIST GADOBUTROL 1 MMOL/ML IV SOLN

[Series 4: T1 · coronal · 5.0mm · 0.49mm/px · 2 of 21 slices shown (1 of 2)]
[im 1/21]
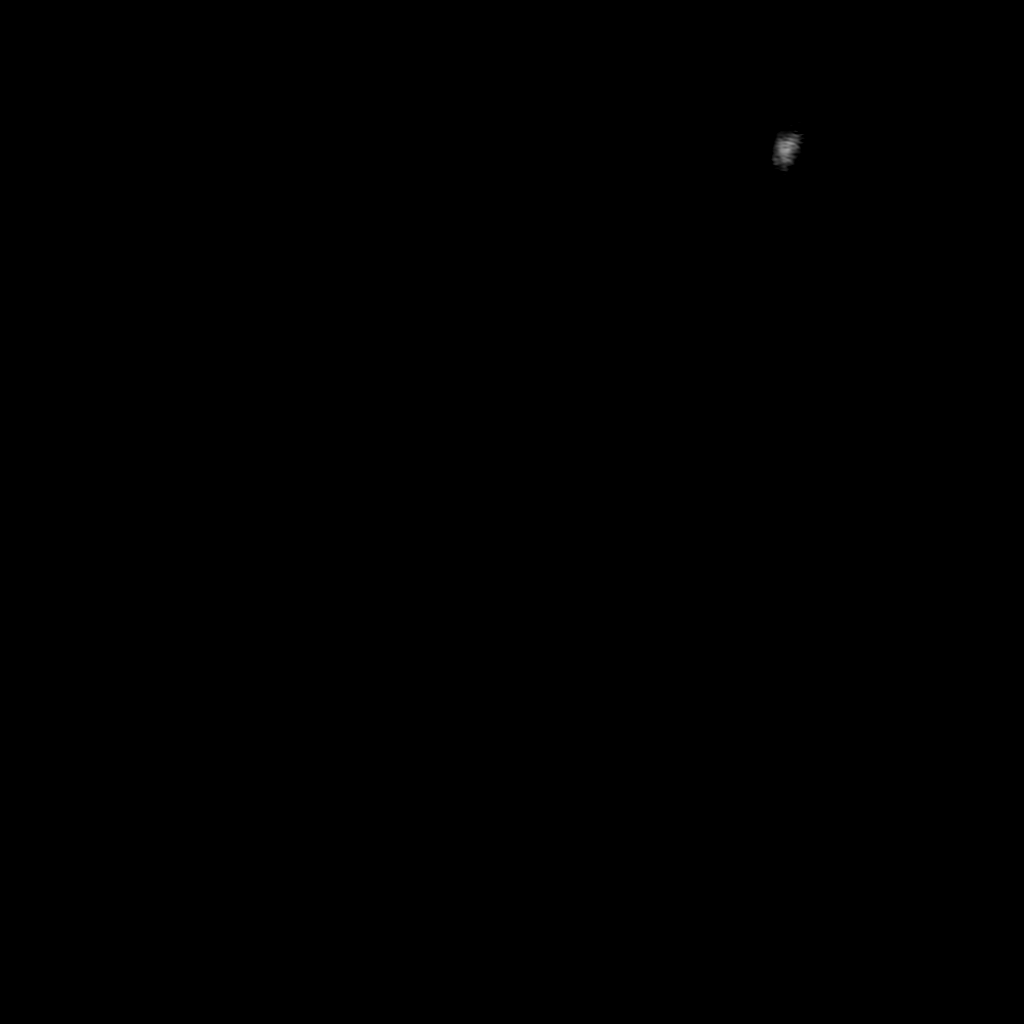
[im 21/21]
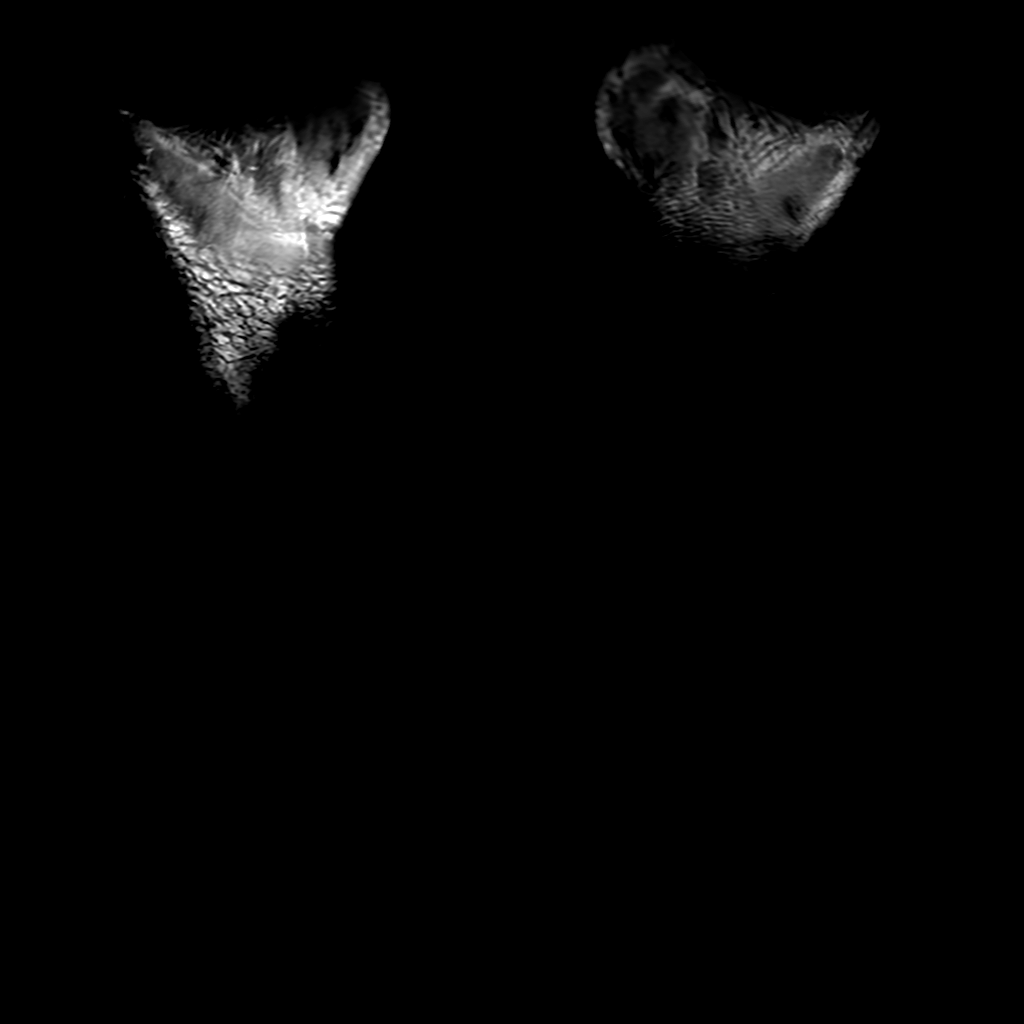

[Series 5: T1 · axial · 6.0mm · 0.51mm/px · z∈[-266,+187]mm · 5 of 71 slices shown (2 of 2)]
[im 1/71]
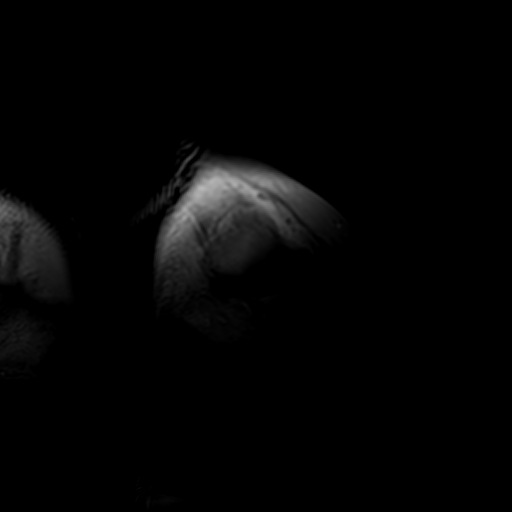
[im 18/71]
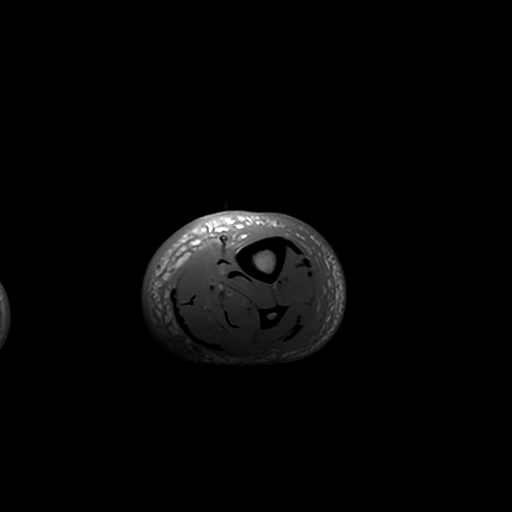
[im 36/71]
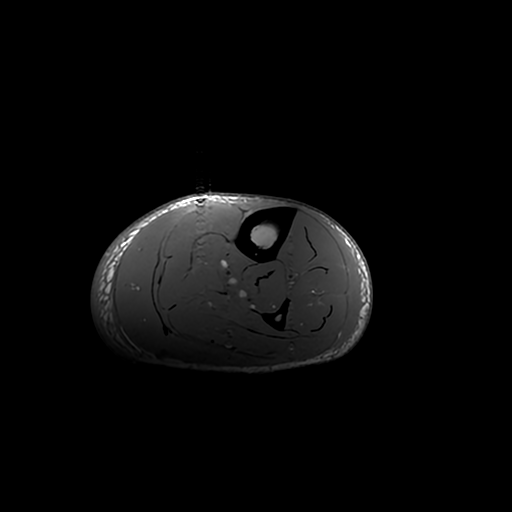
[im 53/71]
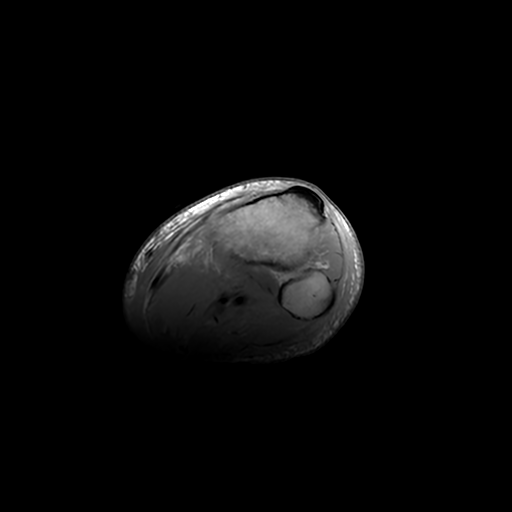
[im 71/71]
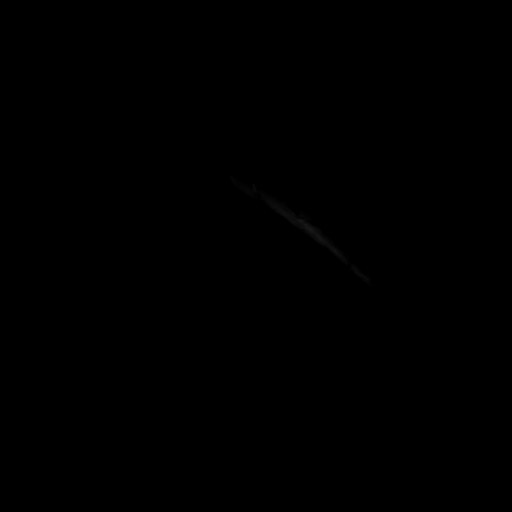

[Series 6: T2 fat-sat · axial · 6.0mm · 0.51mm/px · z∈[-266,+187]mm · 4 of 71 slices shown (1 of 2)]
[im 1/71]
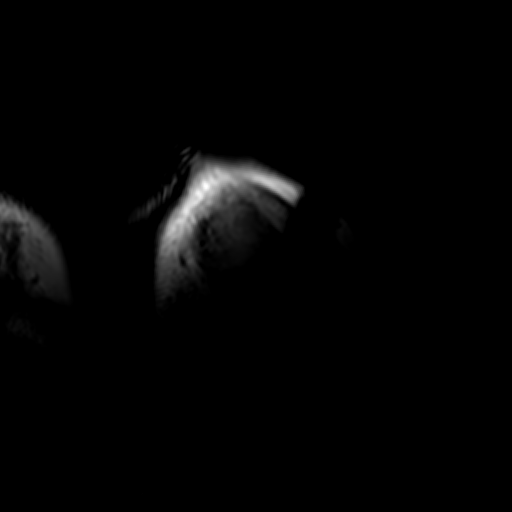
[im 24/71]
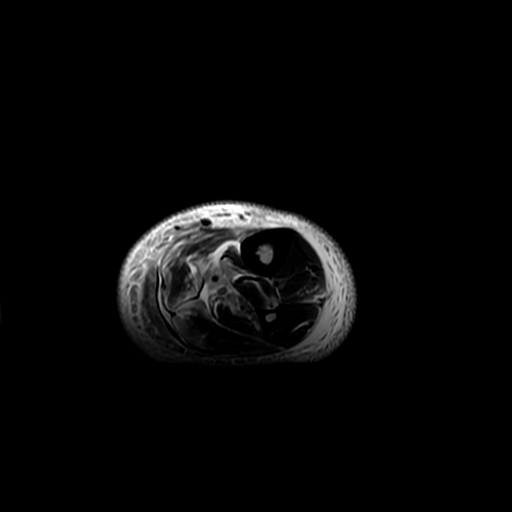
[im 47/71]
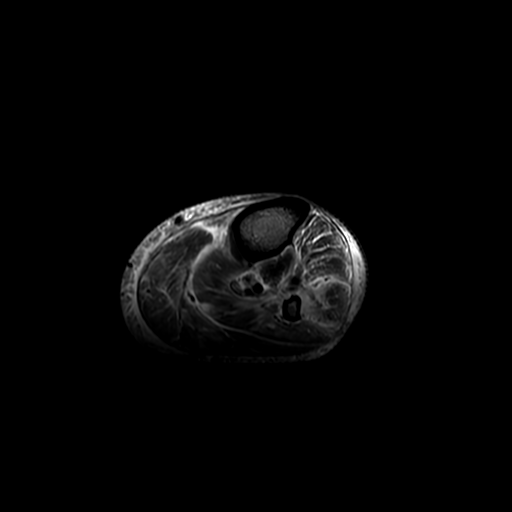
[im 71/71]
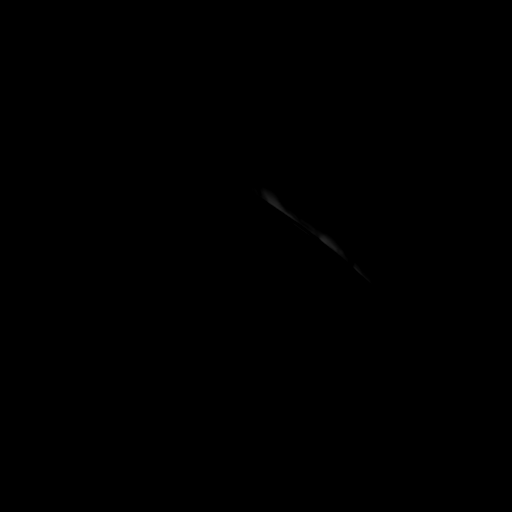

[Series 8: T1 fat-sat · axial · non-contrast · 6.0mm · 0.51mm/px · 1 of 71 slices shown]
[im 1/71]
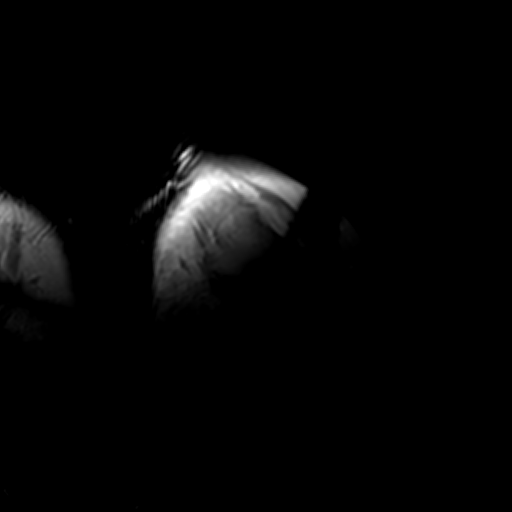

[Series 14: T2 fat-sat · axial · 5.0mm · 0.31mm/px · z∈[-458,-225]mm · 2 of 39 slices shown (2 of 2)]
[im 1/39]
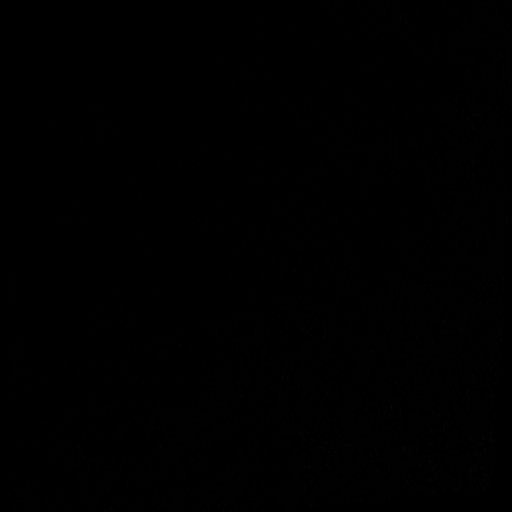
[im 39/39]
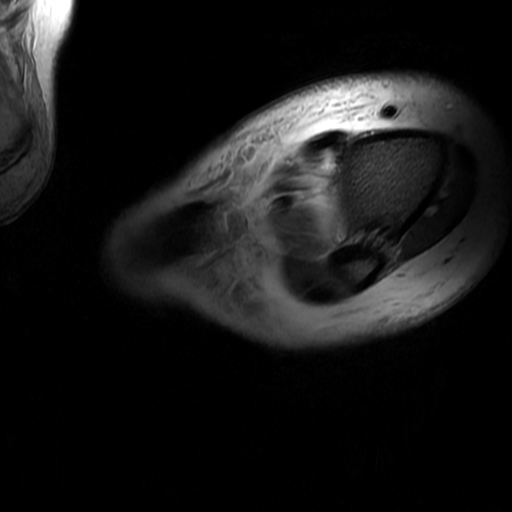

[14 of 40 positions shown; findings below may reference images not displayed]

FINDINGS: Severe diffuse cellulitis and myofasciitis involving the entire left
lower extremity below the knee. Findings are progressive when
compared to the prior study, particularly the myofasciitis. There is
diffuse edema like signal changes in the anterior and posterior
compartments of the leg and foot and interfascial fluid. I do not
see any discrete rim enhancing abscess or evidence of pyomyositis.
No MR findings suspicious for septic arthritis or osteomyelitis.
IMPRESSION: 1. Severe diffuse cellulitis and myofasciitis involving the entire
left lower extremity below the knee. This is progressive when
compared to the prior study.
2. No discrete rim enhancing abscess or evidence of pyomyositis.
3. No MR findings suspicious for septic arthritis or osteomyelitis.

## 2020-05-17 MED ORDER — HYDROMORPHONE HCL 1 MG/ML IJ SOLN
0.5000 mg | Freq: Once | INTRAMUSCULAR | Status: AC
  Filled 2020-05-17: qty 0.5

## 2020-05-17 MED ORDER — HYDROMORPHONE HCL 1 MG/ML IJ SOLN
1.0000 mg | INTRAMUSCULAR | Status: DC | PRN
  Administered 2020-05-17 – 2020-05-18 (×7): 1 mg via INTRAVENOUS
  Filled 2020-05-17 (×7): qty 1

## 2020-05-17 MED ORDER — SODIUM CHLORIDE 0.9 % IV SOLN
800.0000 mg | Freq: Every day | INTRAVENOUS | Status: AC
  Administered 2020-05-17 – 2020-06-07 (×22): 800 mg via INTRAVENOUS
  Filled 2020-05-17 (×23): qty 16

## 2020-05-17 MED ORDER — GADOBUTROL 1 MMOL/ML IV SOLN
7.5000 mL | Freq: Once | INTRAVENOUS | Status: AC | PRN
  Administered 2020-05-17: 7.5 mL via INTRAVENOUS
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
## 2020-05-17 NOTE — Progress Notes (Signed)
[facility] Infectious Diseases Follow Up Note  Patient Identification: Patient Name: [name] MRN: [id number] Admit Date: 05/10/2020  6:27 PM Age: 29 y.o.Today's Date: 05/17/2020   Reason for Visit: Follow upon MRSA bacteremia and left knee infection   Principal Problem:   MRSA bacteremia Active Problems:   Renal failure   Polysubstance abuse (HCC)   Sepsis due to cellulitis (HCC)   Hyponatremia   Cellulitis of left lower extremity   IVDU (intravenous drug user)   Antibiotics: Linezolid 9/18-9/21                    Daptomycin Day 5 ( CK 24)  Assessment 29 Y O male with a h/oIVDU admitted on 9/19 and found to have high grade MRSA bacteremia. MRI Left knee concerning for infection with diffuse cellulitis and small abscesses. TTE 9/20with thin mobile density below the TV annulus. Unclear whether  this is a vegetation or eustacion valve. TEE 9/22 no vegetations. Initially on Linezolid due to concerns of AKI with Vanc. Then changed to Daptomycin at 8mg /kg. A repeat MRI left leg 9/25 was done due to concerns for worsening cellulitis and worsening leukocytosis upto 56 which showed Severe diffuse cellulitis and myofasciitis involving the entire left lower extremity below the knee. This is progressive when compared to the prior study.  On evaluation today, patient complains of severe pain at the left leg. He says its 10/10 and he cannopt move his left leg without any difficulty. He is asking for pain meds to me He is Left leg is very swollen and has healed scab in the left lateral knee with multiple healed scabs in his both Upper and Lower extremities   Recommendations Given the new MRI findings on appropriate antibiotics for MRSA, I am concerned about this being a source control issue rather than failure of antibiotics. His repeat blood cultures from 9/20 are NG I will increase the dosing of Daptomycin from 8mg /kg to 10mg /kg based on  renal function given patient presented with MRSA bacteremia ( I have communicated this with pharmacy) I will recommend surgery to evaluate him given progressive infection to r/o nec fasc.  Monitor CBC and BMP while on IV abx  Following   Rest of the management as per the primary team. Thank you for the consult. Please page with pertinent questions or concerns.  Odette FractionSabina Aroura Vasudevan, MD Infectious Diseases  [facility]   To contact the attending provider between 8A-5P or the covering provider during after hours 5P-8A, please log into the web site [email or web address] and access using universal [place] password for that web site. If you do not have the password, please call the hospital operator. ______________________________________________________________________ Subjective patient seen and examined at the bedside.  Objective BP (!) 145/91 (BP Location: Right Arm)   Pulse (!) 110   Temp 99.1 F (37.3 C) (Oral)   Resp 20   Ht 5\' 10"  (1.778 m)   Wt 81.2 kg   SpO2 96%   BMI 25.69 kg/m     PHYSICAL EXAM Gen: Alert and oriented x 3 HEENT: [place]/AT, PERLA, LEFT EYE HAS SUBCONJUNCTIVAL HEMORRHAGE AND BRUISE BENEATH THE EYES  Neck: Supple, no lymphadenopathy Cardio: RRR, +S1 and S2, no murmur, TACHYCARDIC Resp: CTAB; no wheezes, rhonchi, or rales GI: Soft, nontender, nondistended, bowel sounds + Musc: LEFT KNEE SWOLLEN , LEFT SHOULDER HAS RESTRICTED ROM DUE TO PAIN, LEFT ELBOW HAS GOOD ROM Extremities: LEFT LEG SWOLLEN DIFFUSELY FROM LEFT KNEE DOWNWARDS, WARM, HEALED SCABS,  NO OVERYLYING ERYTHEMA, SEVERELY RESTRICTED ROM DUE TO PAIN Skin: MULTIPLE HEALED SCABS IN THE BILATERAL UE AND LE Neuro: grossly nonfocal Psych: Calm, cooperative   Pertinent Microbiology Results for orders placed or performed during the hospital encounter of 05/10/20  Blood Culture (routine x 2)     Status: Abnormal   Collection Time: 05/10/20  6:43 PM   Specimen: BLOOD RIGHT ARM    Result Value Ref Range Status   Specimen Description BLOOD RIGHT ARM  Final   Special Requests   Final    BOTTLES DRAWN AEROBIC AND ANAEROBIC Blood Culture adequate volume   Culture  Setup Time   Final    GRAM POSITIVE COCCI IN CLUSTERS IN BOTH AEROBIC AND ANAEROBIC BOTTLES CRITICAL RESULT CALLED TO, READ BACK BY AND VERIFIED WITH: Silvio Pate [facility] [id number] AT 1531 BY CM Performed at [facility], [address] [place], [place] [address]    Culture METHICILLIN RESISTANT STAPHYLOCOCCUS AUREUS (A)  Final   Report Status 05/13/2020 FINAL  Final   Organism ID, Bacteria METHICILLIN RESISTANT STAPHYLOCOCCUS AUREUS  Final      Susceptibility   Methicillin resistant staphylococcus aureus - MIC*    CIPROFLOXACIN <=0.5 SENSITIVE Sensitive     ERYTHROMYCIN >=8 RESISTANT Resistant     GENTAMICIN <=0.5 SENSITIVE Sensitive     OXACILLIN >=4 RESISTANT Resistant     TETRACYCLINE <=1 SENSITIVE Sensitive     VANCOMYCIN <=0.5 SENSITIVE Sensitive     TRIMETH/SULFA <=10 SENSITIVE Sensitive     CLINDAMYCIN >=8 RESISTANT Resistant     RIFAMPIN <=0.5 SENSITIVE Sensitive     Inducible Clindamycin NEGATIVE Sensitive     * METHICILLIN RESISTANT STAPHYLOCOCCUS AUREUS  Urine culture     Status: None   Collection Time: 05/10/20  6:43 PM   Specimen: In/Out Cath Urine  Result Value Ref Range Status   Specimen Description   Final    IN/OUT CATH URINE Performed at [facility], [address] [place], [place] [address]    Special Requests   Final    NONE Performed at [facility], [address] [place], [place] [address]    Culture   Final    NO GROWTH Performed at [facility], [address], [place], [place] [address]    Report Status 05/12/2020 FINAL  Final  Blood Culture ID Panel (Reflexed)     Status: Abnormal   Collection Time: 05/10/20  6:43 PM  Result Value Ref Range Status   Enterococcus faecalis NOT DETECTED NOT DETECTED Final    Enterococcus Faecium NOT DETECTED NOT DETECTED Final   Listeria monocytogenes NOT DETECTED NOT DETECTED Final   Staphylococcus species DETECTED (A) NOT DETECTED Final    Comment: CRITICAL RESULT CALLED TO, READ BACK BY AND VERIFIED WITH: PAHRMD J WAFFORD [id number] AT 1531 BY CM    Staphylococcus aureus (BCID) DETECTED (A) NOT DETECTED Final    Comment: Methicillin (oxacillin)-resistant Staphylococcus aureus (MRSA). MRSA is predictably resistant to beta-lactam antibiotics (except ceftaroline). Preferred therapy is vancomycin unless clinically contraindicated. Patient requires contact precautions if  hospitalized. CRITICAL RESULT CALLED TO, READ BACK BY AND VERIFIED WITH: PHARMD J WAFFORD [id number] AT 1531 BY CM    Staphylococcus epidermidis NOT DETECTED NOT DETECTED Final   Staphylococcus lugdunensis NOT DETECTED NOT DETECTED Final   Streptococcus species NOT DETECTED NOT DETECTED Final   Streptococcus agalactiae NOT DETECTED NOT DETECTED Final   Streptococcus pneumoniae NOT DETECTED NOT DETECTED Final   Streptococcus pyogenes NOT  DETECTED NOT DETECTED Final   A.calcoaceticus-baumannii NOT DETECTED NOT DETECTED Final   Bacteroides fragilis NOT DETECTED NOT DETECTED Final   Enterobacterales NOT DETECTED NOT DETECTED Final   Enterobacter cloacae complex NOT DETECTED NOT DETECTED Final   Escherichia coli NOT DETECTED NOT DETECTED Final   Klebsiella aerogenes NOT DETECTED NOT DETECTED Final   Klebsiella oxytoca NOT DETECTED NOT DETECTED Final   Klebsiella pneumoniae NOT DETECTED NOT DETECTED Final   Proteus species NOT DETECTED NOT DETECTED Final   Salmonella species NOT DETECTED NOT DETECTED Final   Serratia marcescens NOT DETECTED NOT DETECTED Final   Haemophilus influenzae NOT DETECTED NOT DETECTED Final   Neisseria meningitidis NOT DETECTED NOT DETECTED Final   Pseudomonas aeruginosa NOT DETECTED NOT DETECTED Final   Stenotrophomonas maltophilia NOT DETECTED NOT DETECTED Final   Candida  albicans NOT DETECTED NOT DETECTED Final   Candida auris NOT DETECTED NOT DETECTED Final   Candida glabrata NOT DETECTED NOT DETECTED Final   Candida krusei NOT DETECTED NOT DETECTED Final   Candida parapsilosis NOT DETECTED NOT DETECTED Final   Candida tropicalis NOT DETECTED NOT DETECTED Final   Cryptococcus neoformans/gattii NOT DETECTED NOT DETECTED Final   Meth resistant mecA/C and MREJ DETECTED (A) NOT DETECTED Final    Comment: CRITICAL RESULT CALLED TO, READ BACK BY AND VERIFIED WITH: [facility] [id number] AT 1531 BY CM Performed at [facility], [address] [place], [place] [address]   Blood Culture (routine x 2)     Status: Abnormal   Collection Time: 05/10/20  6:48 PM   Specimen: BLOOD  Result Value Ref Range Status   Specimen Description   Final    BLOOD RIGHT ANTECUBITAL Performed at [facility], [address] [place], [place] [address]    Special Requests   Final    BOTTLES DRAWN AEROBIC AND ANAEROBIC Blood Culture adequate volume Performed at [facility], [address] [place], [place] [address]    Culture  Setup Time   Final    GRAM POSITIVE COCCI IN CLUSTERS IN BOTH AEROBIC AND ANAEROBIC BOTTLES CRITICAL VALUE NOTED.  VALUE IS CONSISTENT WITH PREVIOUSLY REPORTED AND CALLED VALUE.    Culture (A)  Final    STAPHYLOCOCCUS AUREUS SUSCEPTIBILITIES PERFORMED ON PREVIOUS CULTURE WITHIN THE LAST 5 DAYS. Performed at [facility], [address] [place], [place] [address]    Report Status 05/13/2020 FINAL  Final  SARS Coronavirus 2 by RT PCR (hospital order, performed in [facility] hospital lab) Nasopharyngeal Nasopharyngeal Swab     Status: None   Collection Time: 05/10/20  7:25 PM   Specimen: Nasopharyngeal Swab  Result Value Ref Range Status   SARS Coronavirus 2 NEGATIVE NEGATIVE Final    Comment: (NOTE) SARS-CoV-2 target nucleic acids are NOT DETECTED.  The SARS-CoV-2 RNA is generally detectable in  upper and lower respiratory specimens during the acute phase of infection. The lowest concentration of SARS-CoV-2 viral copies this assay can detect is 250 copies / mL. A negative result does not preclude SARS-CoV-2 infection and should not be used as the sole basis for treatment or other patient management decisions.  A negative result may occur with improper specimen collection / handling, submission of specimen other than nasopharyngeal swab, presence of viral mutation(s) within the areas targeted by this assay, and inadequate number of viral copies (<250 copies / mL). A negative result must be combined with clinical observations, patient history, and epidemiological information.  Fact Sheet  for Patients:   BoilerBrush.com.cy  Fact Sheet for Healthcare Providers: https://pope.com/  This test is not yet approved or  cleared by the [place] FDA and has been authorized for detection and/or diagnosis of SARS-CoV-2 by FDA under an Emergency Use Authorization (EUA).  This EUA will remain in effect (meaning this test can be used) for the duration of the COVID-19 declaration under Section 564(b)(1) of the Act, 21 U.S.C. section 360bbb-3(b)(1), unless the authorization is terminated or revoked sooner.  Performed at [facility], [address], [place], [place] [address]   Culture, blood (Routine X 2) w Reflex to ID Panel     Status: None (Preliminary result)   Collection Time: 05/12/20  8:55 AM   Specimen: BLOOD LEFT ARM  Result Value Ref Range Status   Specimen Description BLOOD LEFT ARM  Final   Special Requests   Final    BOTTLES DRAWN AEROBIC ONLY Blood Culture results may not be optimal due to an inadequate volume of blood received in culture bottles   Culture   Final    NO GROWTH 3 DAYS Performed at [facility], [address], [place], [place] [address]    Report Status PENDING  Incomplete    Culture, blood (Routine X 2) w Reflex to ID Panel     Status: None (Preliminary result)   Collection Time: 05/12/20  8:55 AM   Specimen: BLOOD LEFT ARM  Result Value Ref Range Status   Specimen Description BLOOD LEFT ARM  Final   Special Requests   Final    BOTTLES DRAWN AEROBIC ONLY Blood Culture results may not be optimal due to an inadequate volume of blood received in culture bottles   Culture   Final    NO GROWTH 3 DAYS Performed at [facility], [address], [place], [place] [address]    Report Status PENDING  Incomplete     Pertinent Lab. CBC Latest Ref Rng & Units 05/17/2020 05/16/2020 05/15/2020  WBC 4.0 - 10.5 K/uL 56.9(HH) 54.3(HH) 33.8(H)  Hemoglobin 13.0 - 17.0 g/dL 11.6(L) 11.7(L) 12.2(L)  Hematocrit 39 - 52 % 33.9(L) 35.0(L) 35.9(L)  Platelets 150 - 400 K/uL 447(H) 440(H) 519(H)   CMP Latest Ref Rng & Units 05/17/2020 05/16/2020 05/16/2020  Glucose 70 - 99 mg/dL 92 659(D) 357(S)  BUN 6 - 20 mg/dL 17(B) 93(J) 03(E)  Creatinine 0.61 - 1.24 mg/dL 0.92(Z) 3.00(T) 6.22(Q)  Sodium 135 - 145 mmol/L 125(L) 123(L) 124(L)  Potassium 3.5 - 5.1 mmol/L 5.4(H) 5.8(H) 5.4(H)  Chloride 98 - 111 mmol/L 97(L) 96(L) 95(L)  CO2 22 - 32 mmol/L 19(L) 18(L) 16(L)  Calcium 8.9 - 10.3 mg/dL 7.6(L) 7.5(L) 7.6(L)  Total Protein 6.5 - 8.1 g/dL - - -  Total Bilirubin 0.3 - 1.2 mg/dL - - -  Alkaline Phos 38 - 126 U/L - - -  AST 15 - 41 U/L - - -  ALT 0 - 44 U/L - - -    Pertinent Imaging today Plain films and CT images have been personally visualized and interpreted; radiology reports have been reviewed. Decision making incorporated into the Impression / Recommendations.  MRI Left Foot and Left Tibia and Fibula 05/17/20 FINDINGS: Severe diffuse cellulitis and myofasciitis involving the entire left lower extremity below the knee. Findings are progressive when compared to the prior study, particularly the myofasciitis. There is diffuse edema like signal changes in the anterior and  posterior compartments of the leg and foot and interfascial fluid. I  do not see any discrete rim enhancing abscess or evidence of pyomyositis. No MR findings suspicious for septic arthritis or osteomyelitis.  IMPRESSION: 1. Severe diffuse cellulitis and myofasciitis involving the entire left lower extremity below the knee. This is progressive when compared to the prior study. 2. No discrete rim enhancing abscess or evidence of pyomyositis. 3. No MR findings suspicious for septic arthritis or osteomyelitis.

## 2020-05-17 NOTE — Progress Notes (Signed)
   05/16/20 2300  Assess: MEWS Score  Temp 99.7 F (37.6 C)  BP (!) 165/95  Pulse Rate (!) 118  ECG Heart Rate (!) 124  Resp (!) 26  SpO2 99 %  Assess: MEWS Score  MEWS Temp 0  MEWS Systolic 0  MEWS Pulse 2  MEWS RR 2  MEWS LOC 0  MEWS Score 4  MEWS Score Color Red  Assess: if the MEWS score is Yellow or Red  Were vital signs taken at a resting state? Yes  Focused Assessment No change from prior assessment  Early Detection of Sepsis Score *See Row Information* Low  MEWS guidelines implemented *See Row Information* Yes  Treat  MEWS Interventions Administered scheduled meds/treatments  Pain Scale 0-10  Pain Score 10  Faces Pain Scale 2  Pain Type Acute pain  Pain Location Generalized  Pain Orientation Left  Pain Radiating Towards  (Left Shoulder)  Pain Descriptors / Indicators Aching  Pain Frequency Constant  Pain Onset On-going  Patients Stated Pain Goal 4  Pain Intervention(s) Medication (See eMAR)  Multiple Pain Sites No  Breathing 0  Negative Vocalization 0  Facial Expression 0  Body Language 0  Consolability 0  PAINAD Score 0  Patients response to intervention Relief  Take Vital Signs  Increase Vital Sign Frequency  Red: Q 1hr X 4 then Q 4hr X 4, if remains red, continue Q 4hrs  Escalate  MEWS: Escalate Red: discuss with charge nurse/RN and provider, consider discussing with RRT  Notify: Charge Nurse/RN  Name of Charge Nurse/RN Notified Lyn RN  Date Charge Nurse/RN Notified 05/16/20  Time Charge Nurse/RN Notified 2304  Notify: Provider  Provider Name/Title Dr Antionette Char MD  Date Provider Notified 05/16/20  Time Provider Notified 2314  Notification Type Page  Notification Reason Other (Comment) (MEWS score)  Notify: Rapid Response  Name of Rapid Response RN Notified David RN  Date Rapid Response Notified 05/16/20  Time Rapid Response Notified 2311  Document  Patient Outcome Not stable and remains on department  Progress note created (see row info) Yes

## 2020-05-17 NOTE — Progress Notes (Signed)
This note also relates to the following rows which could not be included: ECG Heart Rate - Cannot attach notes to unvalidated device data    05/17/20 1700  Vitals  Temp 98.5 F (36.9 C)  Temp Source Oral  BP (!) 150/94  MAP (mmHg) 107  BP Location Right Arm  BP Method Automatic  Pulse Rate (!) 110  Level of Consciousness  Level of Consciousness Alert  MEWS COLOR  MEWS Score Color Green  Oxygen Therapy  SpO2 98 %  Pain Assessment  Pain Scale 0-10  Pain Score 10  Pain Type Acute pain  Pain Location Leg  Pain Orientation Left  Pain Descriptors / Indicators Aching  Pain Onset On-going  Pain Intervention(s) Medication (See eMAR)  MEWS Score  MEWS Temp 0  MEWS Systolic 0  MEWS Pulse 1  MEWS RR 0  MEWS LOC 0  MEWS Score 1    Patient previous yellows MEWS was due to increase HR which is the patient's current baseline at this moment. Patient expresses 10/10 pain. Given prn 5mg  po oxycodone.

## 2020-05-17 NOTE — Progress Notes (Signed)
Pharmacy Antibiotic Note  [name] is a 29 y.o. male admitted on 05/10/2020 with MRSA bacteremia and left knee infection.  Pharmacy has been consulted for daptomycin dosing.  TTE with thin mobile density below tricuspid valve but unclear if vegetation or valve. No evidence of septic emboli. Patient's renal function significantly improving but still not at baseline. Patient is afebrile, WBC continues to increase to 56.9 today.Ortho reconsulted 9/24 due to continued leg and shoulder pain, and repeat LLE MRI demonstrated progressive LLE cellulitis without signs of septic arthritis or osteo. CK 72 prior to starting Dapto> down to 24 today. Plan for total of about 3 weeks of IV antibiotic therapy.  Due to worsening cellulitis and leukocytosis, ID has recommended increasing dapto from 8mg /kg to 10 mg/kg. Will change 560mg  to 800mg  daily.  Plan: Increase daptomycin to 800mg  IV q24h through 9/27, then plan to give oritavancin x 1 on 9/27 Monitor C&S, renal function, clinical improvement and results of TEE for LOT Monitor CK weekly while on daptomycin  Height: 5\' 10"  (177.8 cm) Weight: 81.2 kg (179 lb 0.2 oz) IBW/kg (Calculated) : 73  Temp (24hrs), Avg:99.4 F (37.4 C), Min:98.9 F (37.2 C), Max:100.3 F (37.9 C)   Recent Labs  Lab 05/10/20 1843 05/10/20 1925 05/10/20 1937 05/13/20 0356 05/13/20 0356 05/14/20 0534 05/15/20 1003 05/16/20 0905 05/16/20 1709 05/17/20 0600  WBC 17.6*  --    < > 17.1*  --  19.8* 33.8* 54.3*  --  56.9*  CREATININE 10.19*  --    < > 2.79*   < > 2.80* 2.76* 2.55* 2.52* 2.36*  LATICACIDVEN 0.9 1.1  --   --   --   --   --   --   --   --    < > = values in this interval not displayed.    Estimated Creatinine Clearance: 47.7 mL/min (A) (by C-G formula based on SCr of 2.36 mg/dL (H)).    No Known Allergies  Antimicrobials this admission: Vancomycin 9/18 x1 Ceftriaxone 9/18 x1 Linezolid 9/19>9/21 Daptomycin 9/21 >>   Dose adjustments this  admission: N/A  Microbiology results: 9/18 Bcx: MRSA 4o4 bottles 9/18 UCx: neg 9/20 BCx: sent   Thank you for allowing 05-19-1981 to participate in this patients care.   10/21, PharmD PGY2 ID Pharmacy Resident Phone between 7 am - 3:30 pm: 10/18  Please check AMION for all [facility] phone numbers After 10:00 PM, call Main Pharmacy [phone]  05/17/2020 11:15 AM

## 2020-05-17 NOTE — Progress Notes (Signed)
Patient complains of "stomach exploding". RN performed abdominal assessment and did not see any distention or swelling. Physician paged and notified. Patient is receiving dokusate BID and prn pain medication frequently without much nutritional input. Told to give next dose of IV dilaudid when due. Will continue to monitor the patient for changes.
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
## 2020-05-17 NOTE — Progress Notes (Signed)
   05/17/20 1900  Assess: MEWS Score  Temp 98.4 F (36.9 C)  BP (!) 148/95  ECG Heart Rate (!) 135  Resp 20  Level of Consciousness Alert  SpO2 96 %  O2 Device Room Air  Assess: MEWS Score  MEWS Temp 0  MEWS Systolic 0  MEWS Pulse 3  MEWS RR 0  MEWS LOC 0  MEWS Score 3  MEWS Score Color Yellow  Assess: if the MEWS score is Yellow or Red  Were vital signs taken at a resting state? Yes  Focused Assessment No change from prior assessment  Early Detection of Sepsis Score *See Row Information* Low  MEWS guidelines implemented *See Row Information* No, previously yellow, continue vital signs every 4 hours  Notify: Charge Nurse/RN  Name of Charge Nurse/RN Notified Fred RN  Date Charge Nurse/RN Notified 05/17/20  Time Charge Nurse/RN Notified 2000  Notify: Provider  Provider Name/Title n/a  Notify: Rapid Response  Name of Rapid Response RN Notified n/a  Document  Patient Outcome Not stable and remains on department  Progress note created (see row info) Yes

## 2020-05-17 NOTE — Progress Notes (Signed)
OT Cancellation Note  Patient Details Name: [name] MRN: [id number] DOB: 1990-11-19   Cancelled Treatment:    Reason Eval/Treat Not Completed: Other (comment) First attempt patient leaving floor for MRI, second attempt patient medicated ~1hr prior however reporting still in 10/10 pain "I don't think moving is a good thing right now." Patient MRI: Severe diffuse cellulitis and myofasciitis involving the entire left lower extremity below the knee. This is progressive when compared to the prior study. Will continue to follow to progress patient as able.  Marlyce Huge OT OT pager: [phone]   Carmelia Roller 05/17/2020, 12:25 PM

## 2020-05-18 ENCOUNTER — Inpatient Hospital Stay (HOSPITAL_COMMUNITY): Payer: Self-pay

## 2020-05-18 LAB — CBC WITH DIFFERENTIAL/PLATELET
Abs Immature Granulocytes: 1.68 10*3/uL — ABNORMAL HIGH (ref 0.00–0.07)
Basophils Absolute: 0.2 10*3/uL — ABNORMAL HIGH (ref 0.0–0.1)
Basophils Relative: 0 %
Eosinophils Absolute: 0.3 10*3/uL (ref 0.0–0.5)
Eosinophils Relative: 0 %
HCT: 32.8 % — ABNORMAL LOW (ref 39.0–52.0)
Hemoglobin: 10.8 g/dL — ABNORMAL LOW (ref 13.0–17.0)
Immature Granulocytes: 3 %
Lymphocytes Relative: 3 %
Lymphs Abs: 1.5 10*3/uL (ref 0.7–4.0)
MCH: 27.8 pg (ref 26.0–34.0)
MCHC: 32.9 g/dL (ref 30.0–36.0)
MCV: 84.3 fL (ref 80.0–100.0)
Monocytes Absolute: 2.8 10*3/uL — ABNORMAL HIGH (ref 0.1–1.0)
Monocytes Relative: 5 %
Neutro Abs: 54.7 10*3/uL — ABNORMAL HIGH (ref 1.7–7.7)
Neutrophils Relative %: 89 %
Platelets: 503 10*3/uL — ABNORMAL HIGH (ref 150–400)
RBC: 3.89 MIL/uL — ABNORMAL LOW (ref 4.22–5.81)
RDW: 13.2 % (ref 11.5–15.5)
WBC: 61.1 10*3/uL (ref 4.0–10.5)
nRBC: 0 % (ref 0.0–0.2)

## 2020-05-18 LAB — COMPREHENSIVE METABOLIC PANEL
ALT: 21 U/L (ref 0–44)
AST: 35 U/L (ref 15–41)
Albumin: 1.1 g/dL — ABNORMAL LOW (ref 3.5–5.0)
Alkaline Phosphatase: 138 U/L — ABNORMAL HIGH (ref 38–126)
Anion gap: 12 (ref 5–15)
BUN: 61 mg/dL — ABNORMAL HIGH (ref 6–20)
CO2: 19 mmol/L — ABNORMAL LOW (ref 22–32)
Calcium: 7.5 mg/dL — ABNORMAL LOW (ref 8.9–10.3)
Chloride: 93 mmol/L — ABNORMAL LOW (ref 98–111)
Creatinine, Ser: 1.79 mg/dL — ABNORMAL HIGH (ref 0.61–1.24)
GFR calc Af Amer: 58 mL/min — ABNORMAL LOW (ref >60)
GFR calc non Af Amer: 50 mL/min — ABNORMAL LOW (ref >60)
Glucose, Bld: 98 mg/dL (ref 70–99)
Potassium: 6.4 mmol/L (ref 3.5–5.1)
Sodium: 124 mmol/L — ABNORMAL LOW (ref 135–145)
Total Bilirubin: 0.3 mg/dL (ref 0.3–1.2)
Total Protein: 5.3 g/dL — ABNORMAL LOW (ref 6.5–8.1)

## 2020-05-18 IMAGING — CT CT ABD-PELV W/O CM
2 of 4 series · 16 of 46 positions shown, 18 images · non-contrast
Comparison: [DATE]

CLINICAL DATA: Abdominal pain and fever. Elevated white cell count.
IV drug use with MRSA bacteremia.

EXAM:
CT ABDOMEN AND PELVIS WITHOUT CONTRAST
TECHNIQUE: Multidetector CT imaging of the abdomen and pelvis was performed
following the standard protocol without IV contrast.

[Series 3: a/p w/o 5mm · axial · non-contrast · 0.81mm/px · z∈[-591,-96]mm · 13 of 109 slices shown, 15 images]
[im 5/109  soft-tissue]
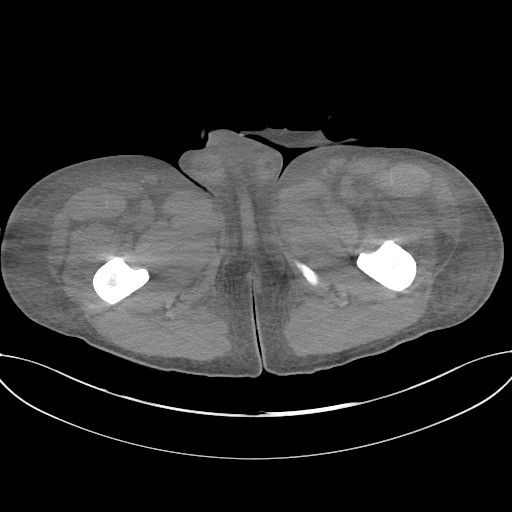
[im 5/109  bone]
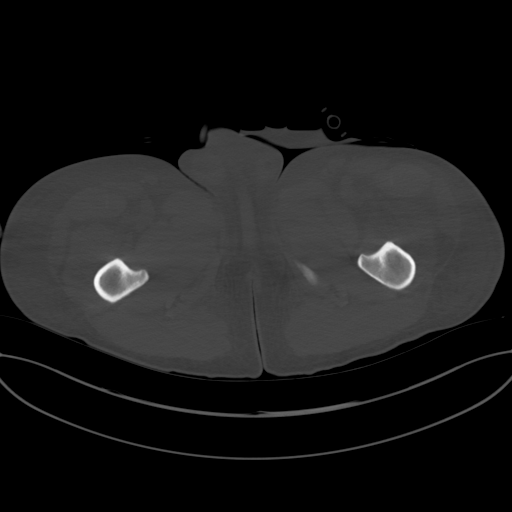
[im 14/109  soft-tissue]
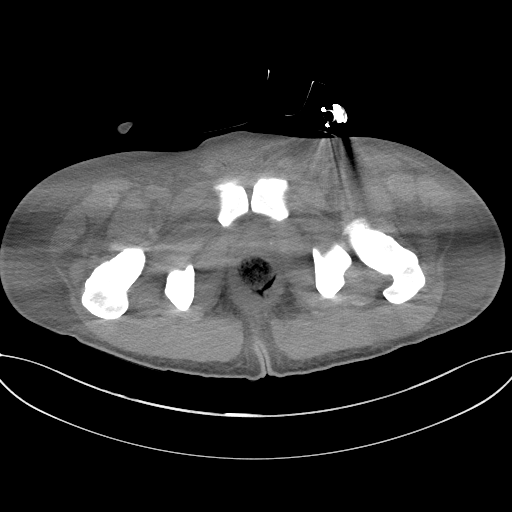
[im 23/109  soft-tissue]
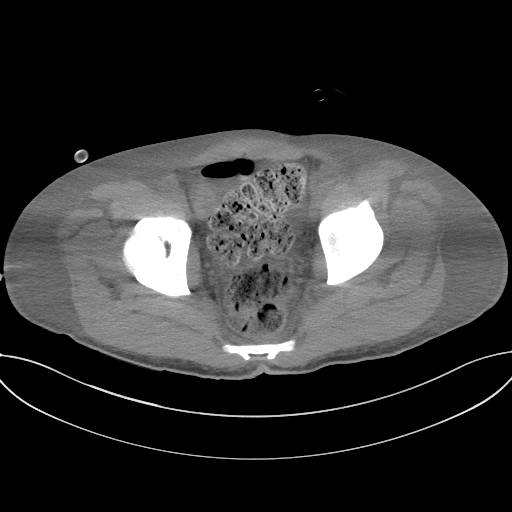
[im 32/109  soft-tissue]
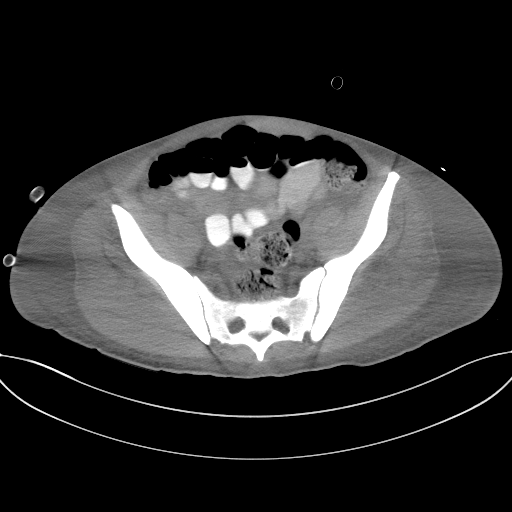
[im 37/109  soft-tissue]
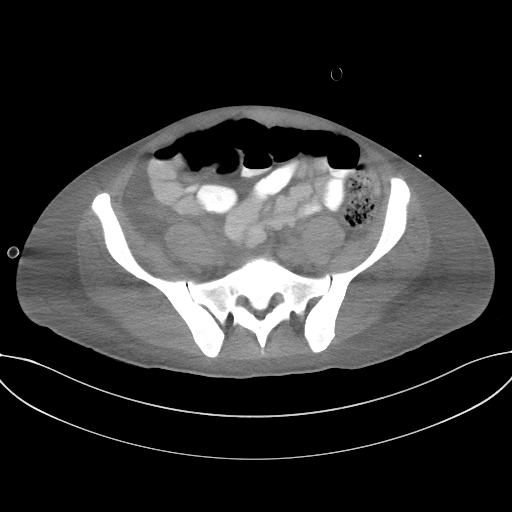
[im 46/109  soft-tissue]
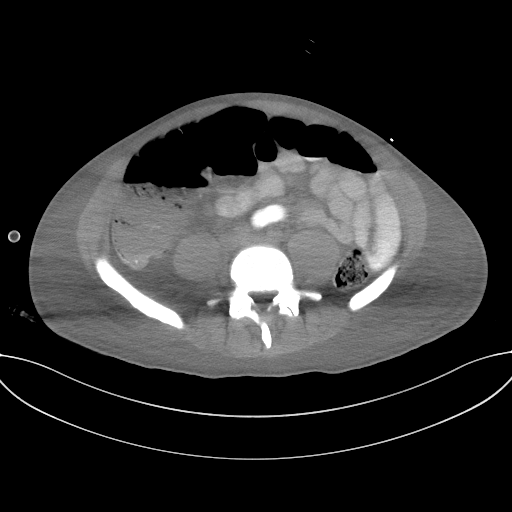
[im 55/109  soft-tissue]
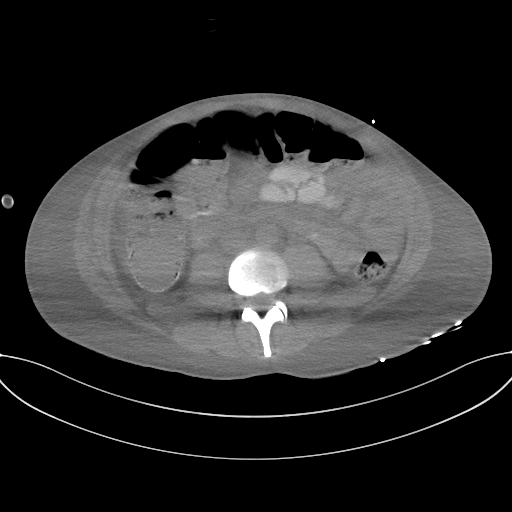
[im 64/109  soft-tissue]
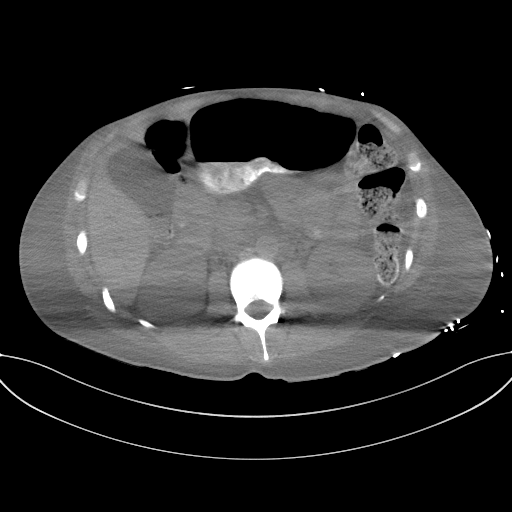
[im 73/109  soft-tissue]
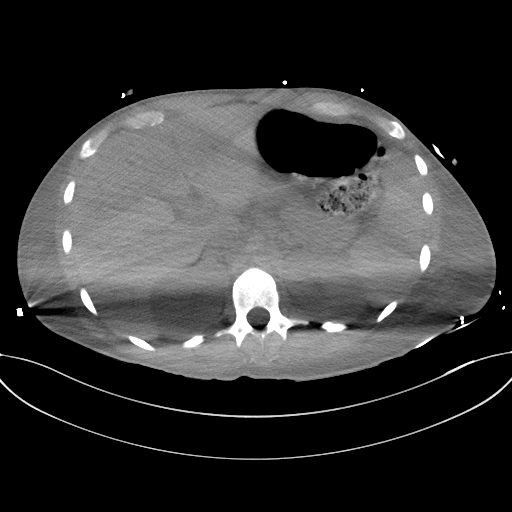
[im 73/109  bone]
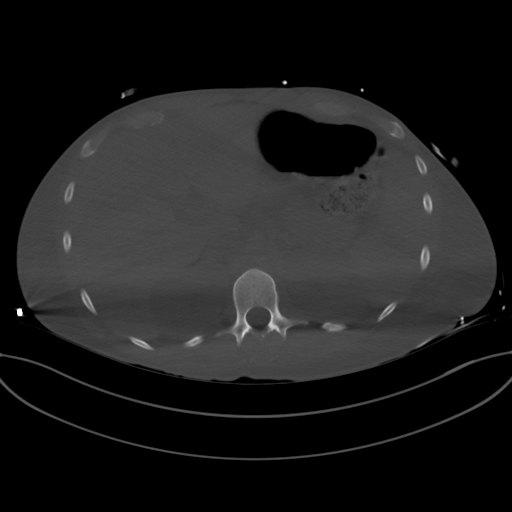
[im 77/109  soft-tissue]
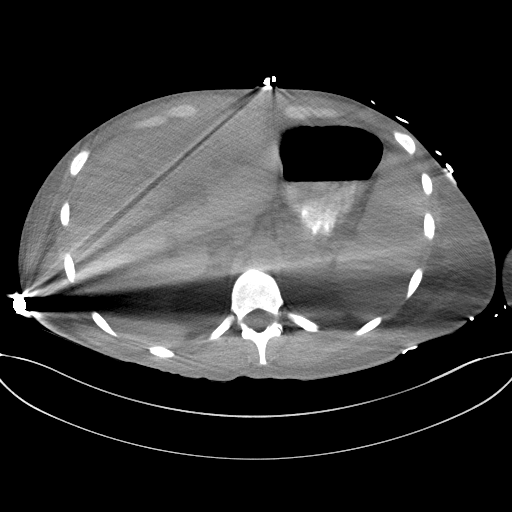
[im 86/109  soft-tissue]
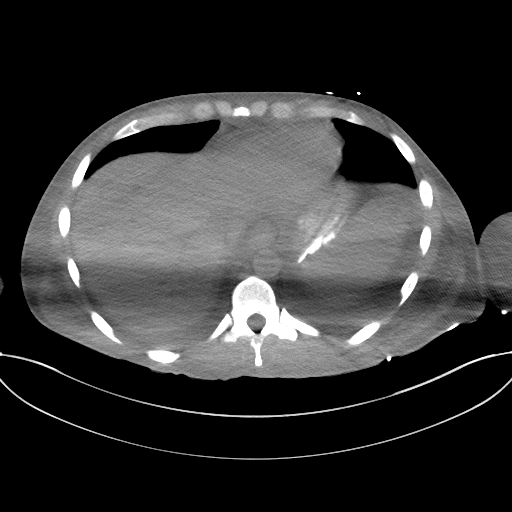
[im 95/109  soft-tissue]
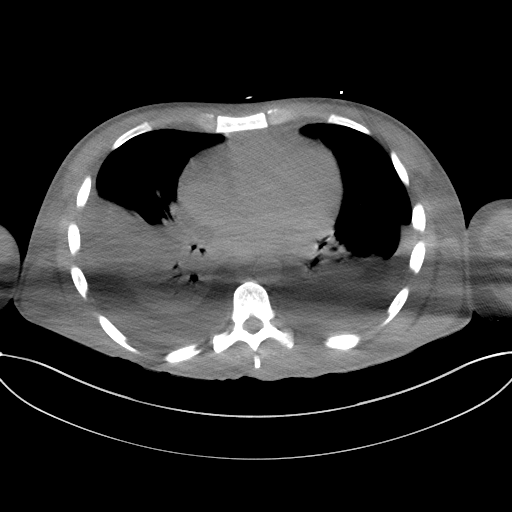
[im 104/109  soft-tissue]
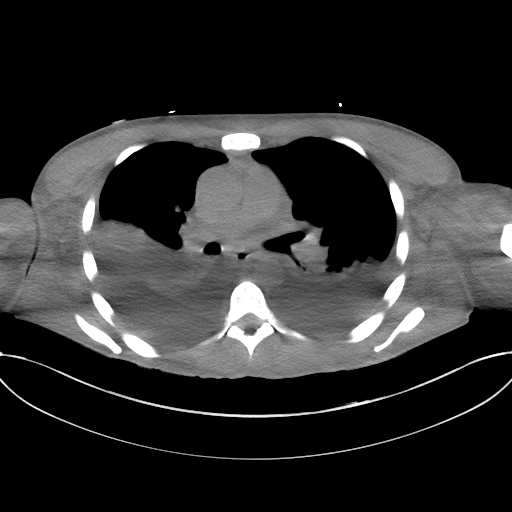

[Series 6: a/p w/o cor · coronal · non-contrast · 0.83mm/px · 3 of 150 slices shown]
[im 50/150  soft-tissue]
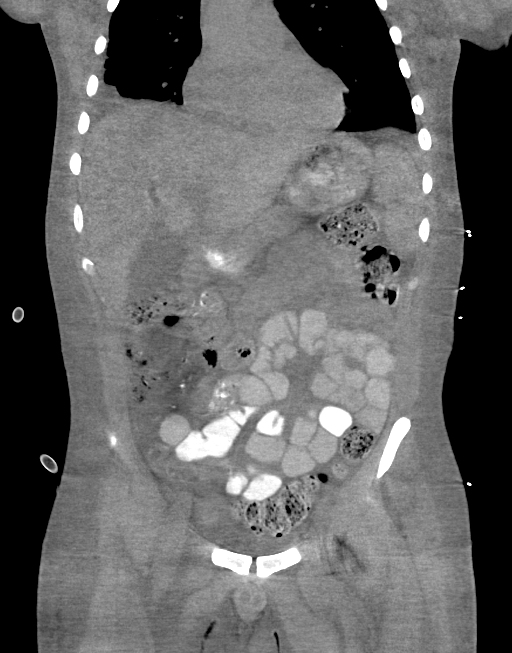
[im 67/150  soft-tissue]
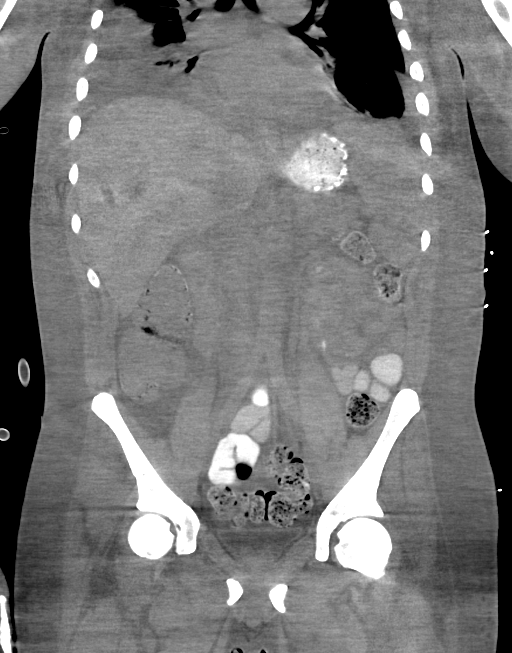
[im 83/150  soft-tissue]
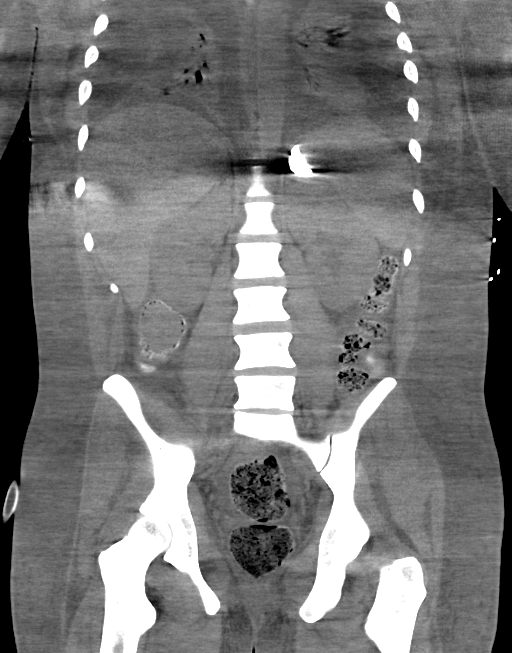

[16 of 46 positions shown; findings below may reference images not displayed]

FINDINGS: Evaluation of solid organs and vascular structures is limited
without IV contrast material.

Lower chest: Large bilateral pleural effusions with basilar
atelectasis and consolidation bilaterally. Heart appears mildly
enlarged. No obvious pericardial effusion.

Hepatobiliary: No focal liver abnormality is seen. No gallstones,
gallbladder wall thickening, or biliary dilatation.

Pancreas: Unremarkable. No pancreatic ductal dilatation or
surrounding inflammatory changes.

Spleen: Normal in size without focal abnormality.

Adrenals/Urinary Tract: Adrenal glands are unremarkable. Kidneys are
normal, without renal calculi, focal lesion, or hydronephrosis.
Bladder is unremarkable.

Stomach/Bowel: Stomach, small bowel, and colon are not abnormally
distended. Stool fills the colon. No wall thickening or inflammatory
changes appreciated. Appendix is not identified.

Vascular/Lymphatic: Normal caliber abdominal aorta. No obvious
retroperitoneal lymphadenopathy.

Reproductive: Prostate is unremarkable.

Other: Moderate diffuse free fluid throughout the abdomen and
pelvis. Edema in the mesentery and throughout the subcutaneous fat.
No obvious loculated collections.

Musculoskeletal: No destructive bone lesions.
IMPRESSION: 1. Large bilateral pleural effusions with basilar atelectasis and
consolidation bilaterally.
2. Moderate diffuse free fluid throughout the abdomen and pelvis.
Edema in the mesentery and throughout the subcutaneous fat. No
obvious loculated collections. Changes likely indicate anasarca.
3. No evidence of bowel obstruction or inflammation.

## 2020-05-18 IMAGING — DX DG CHEST 1V PORT
1 series · 1 of 1 positions shown · non-contrast
Comparison: [DATE]

CLINICAL DATA: Dyspnea.

EXAM:
PORTABLE CHEST 1 VIEW

[chest ap]
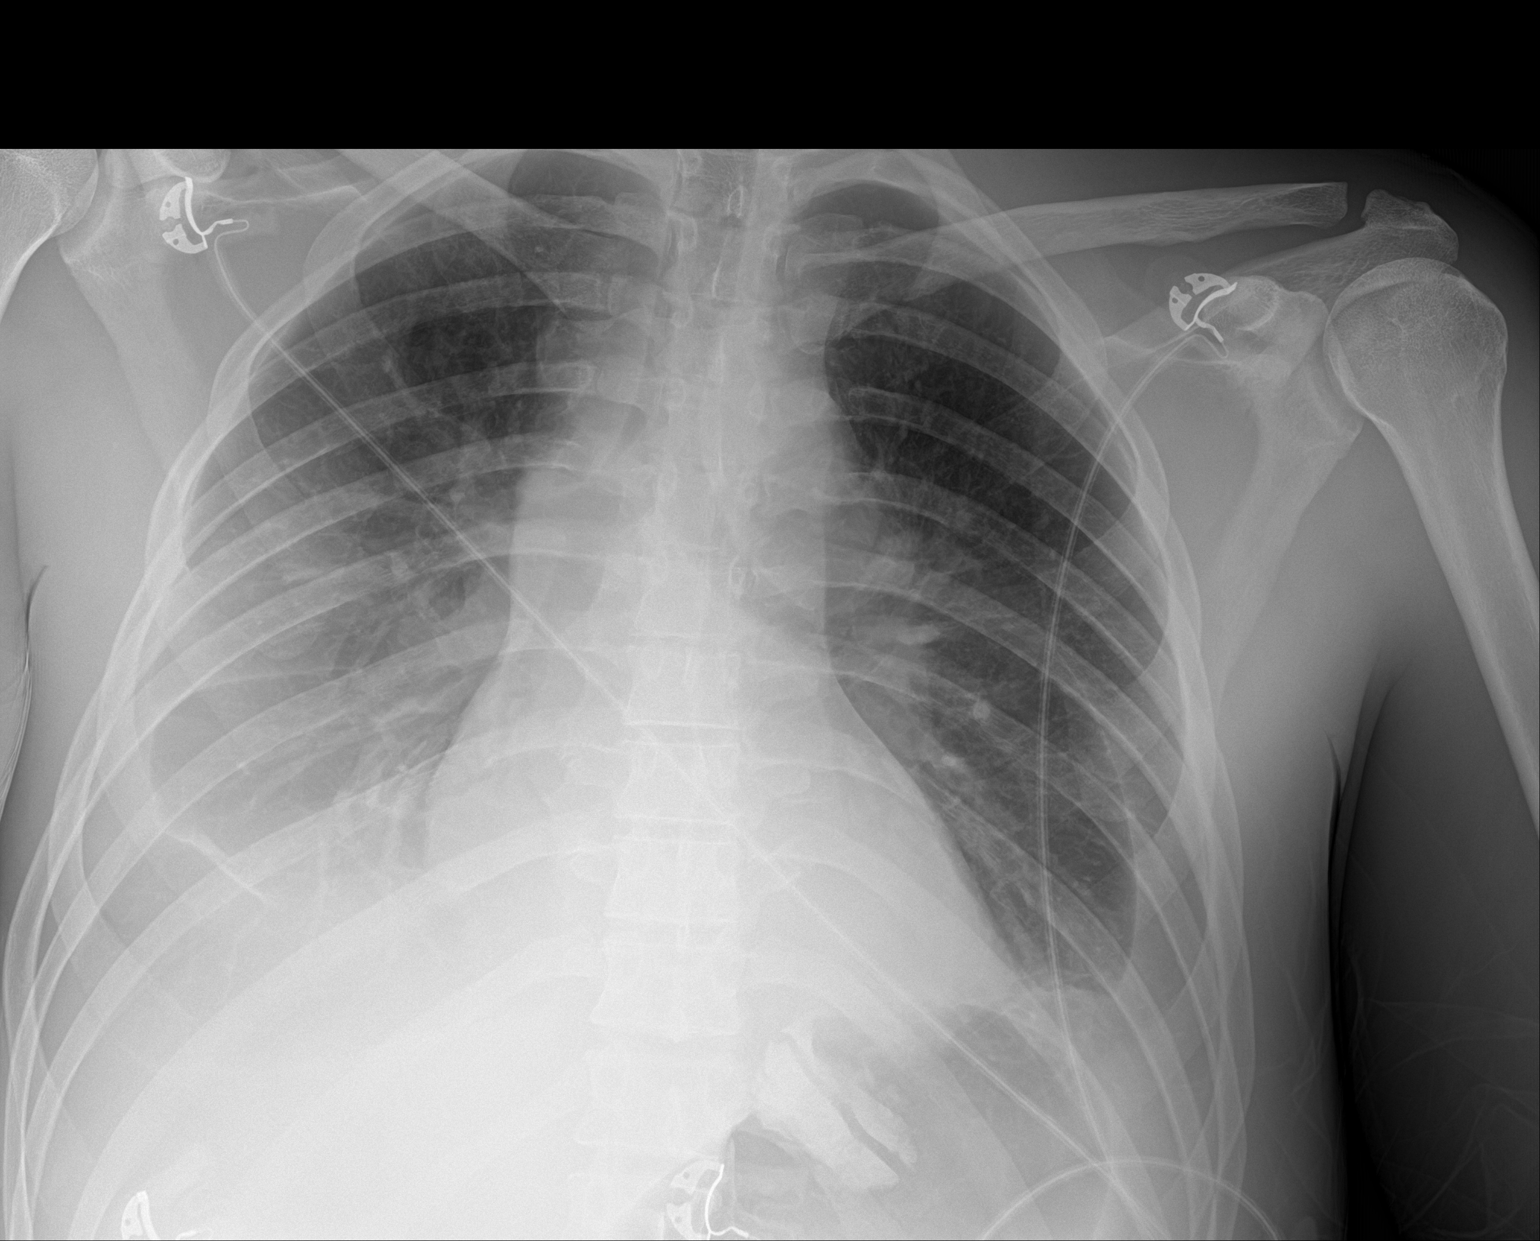

[1 of 1 positions shown; findings below may reference images not displayed]

FINDINGS: Moderate bilateral pleural effusions with associated airspace
opacities have developed since prior exam. Heart is unchanged in
size. There is no pulmonary edema. No pneumothorax. High density
structure projects over the left upper abdomen of unknown
significance.
IMPRESSION: Moderate bilateral pleural effusions with associated airspace
opacities, new since prior exam.

## 2020-05-18 MED ORDER — LORAZEPAM 1 MG PO TABS: 1.0000 mg | ORAL_TABLET | Freq: Two times a day (BID) | ORAL | Status: DC | PRN

## 2020-05-18 MED ORDER — HYDROMORPHONE HCL 1 MG/ML IJ SOLN
1.0000 mg | INTRAMUSCULAR | Status: DC | PRN
  Administered 2020-05-18: 1 mg via INTRAVENOUS
  Administered 2020-05-18: 1.5 mg via INTRAVENOUS
  Administered 2020-05-18: 1 mg via INTRAVENOUS
  Administered 2020-05-19 (×4): 2 mg via INTRAVENOUS
  Administered 2020-05-19: 1.5 mg via INTRAVENOUS
  Administered 2020-05-19 – 2020-05-22 (×21): 2 mg via INTRAVENOUS
  Filled 2020-05-18 (×3): qty 2
  Filled 2020-05-18: qty 1.5
  Filled 2020-05-18 (×3): qty 2
  Filled 2020-05-18: qty 1
  Filled 2020-05-18 (×5): qty 2
  Filled 2020-05-18: qty 1.5
  Filled 2020-05-18 (×11): qty 2
  Filled 2020-05-18: qty 1
  Filled 2020-05-18 (×3): qty 2

## 2020-05-18 MED ORDER — OXYMETAZOLINE HCL 0.05 % NA SOLN
1.0000 | Freq: Two times a day (BID) | NASAL | Status: DC
  Administered 2020-05-19 – 2020-05-26 (×9): 1 via NASAL
  Filled 2020-05-18: qty 30

## 2020-05-18 MED ORDER — IOHEXOL 9 MG/ML PO SOLN
500.0000 mL | ORAL | Status: AC
  Administered 2020-05-18 (×2): 500 mL via ORAL

## 2020-05-18 MED ORDER — CLONIDINE HCL 0.1 MG PO TABS
0.1000 mg | ORAL_TABLET | Freq: Two times a day (BID) | ORAL | Status: DC
  Administered 2020-05-18 – 2020-05-27 (×18): 0.1 mg via ORAL
  Filled 2020-05-18 (×19): qty 1

## 2020-05-18 MED ORDER — FUROSEMIDE 10 MG/ML IJ SOLN
20.0000 mg | Freq: Once | INTRAMUSCULAR | Status: AC
  Administered 2020-05-18: 20 mg via INTRAVENOUS
  Filled 2020-05-18: qty 2

## 2020-05-18 MED ORDER — SODIUM ZIRCONIUM CYCLOSILICATE 10 G PO PACK
10.0000 g | PACK | Freq: Two times a day (BID) | ORAL | Status: DC
  Administered 2020-05-18 (×2): 10 g via ORAL
  Filled 2020-05-18 (×2): qty 1

## 2020-05-18 MED ORDER — CIPROFLOXACIN IN D5W 400 MG/200ML IV SOLN
400.0000 mg | Freq: Two times a day (BID) | INTRAVENOUS | Status: DC
  Administered 2020-05-18 – 2020-05-20 (×4): 400 mg via INTRAVENOUS
  Filled 2020-05-18 (×6): qty 200

## 2020-05-18 MED ORDER — SODIUM POLYSTYRENE SULFONATE 15 GM/60ML PO SUSP
15.0000 g | Freq: Once | ORAL | Status: AC
  Administered 2020-05-18: 15 g via ORAL
  Filled 2020-05-18: qty 60

## 2020-05-18 NOTE — Progress Notes (Signed)
Pharmacy Antibiotic Note  [name] is a 29 y.o. male admitted on 05/10/2020 with MRSA bacteremia and left knee infection.  Pharmacy has been consulted for daptomycin and ciprofloxacin dosing.  TTE with thin mobile density below tricuspid valve however TEE negative. No evidence of septic emboli. Patient's renal function significantly improving but still not at baseline. Patient is afebrile, WBC continues to increase to 54.1 today.Ortho reconsulted 9/24 due to continued leg and shoulder pain, and repeat LLE MRI demonstrated progressive LLE cellulitis without signs of septic arthritis or osteo. CK 72 prior to starting Dapto> down to 18. Plan for total of about 3 weeks of IV antibiotic therapy.  Due to worsening cellulitis and leukocytosis, ID recommended increasing dapto from 8mg /kg to 10 mg/kg.   Pharmacy has now been consulted to start cipro for increased gram negative coverage.   Plan: Continue daptomycin 800mg  IV q24h through 9/27, then plan to give oritavancin x 1 on 9/27 Start ciprofloxacin 400 mg IV every 12 hours  Monitor C&S, renal function, clinical improvement Monitor CK weekly while on daptomycin  Height: 5\' 10"  (177.8 cm) Weight: 82.4 kg (181 lb 10.5 oz) IBW/kg (Calculated) : 73  Temp (24hrs), Avg:98.8 F (37.1 C), Min:98.4 F (36.9 C), Max:99.2 F (37.3 C)   Recent Labs  Lab 05/15/20 1003 05/15/20 1003 05/16/20 0905 05/16/20 1709 05/17/20 0600 05/17/20 1735 05/18/20 1159  WBC 33.8*  --  54.3*  --  56.9* 54.1* 61.1*  CREATININE 2.76*   < > 2.55* 2.52* 2.36* 2.08* 1.79*   < > = values in this interval not displayed.    Estimated Creatinine Clearance: 62.9 mL/min (A) (by C-G formula based on SCr of 1.79 mg/dL (H)).    No Known Allergies  Antimicrobials this admission: Vancomycin 9/18 x1 Ceftriaxone 9/18 x1 Linezolid 9/19>9/21 Daptomycin 9/21 >> (increased 9/25) >> Oritavancin x1 >> (9/27) Cipro 9/26 >>   Dose adjustments this  admission: N/A  Microbiology results: 9/18 urine: negative 9/18 COVID: negative 9/18 BCx: 1/4 bottles w/ Staph aureus 9/18: BCID Staph, mecA detected 9/20 repeat BCx: neg   Thank you for allowing 10/18 to participate in this patients care.   10/18, PharmD PGY1 Acute Care Pharmacy Resident Phone: [phone] 05/18/2020 2:53 PM  Please check [email or web address] for unit specific pharmacy phone numbers.
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
## 2020-05-18 NOTE — Progress Notes (Addendum)
OT Cancellation Note  Patient Details Name: HOA DERISO MRN: [id number] DOB: 06/08/1991   Cancelled Treatment:    Reason Eval/Treat Not Completed: Other (comment) (Chaplain in room.) On second attempt, RN notified OT of critical lab value with potassium of 6.4. OT will continue efforts when patient is able to participate.   Supplemental OT, Department of rehab services [phone]  Noni Stonesifer R H. 05/18/2020, 1:49 PM
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
## 2020-05-18 NOTE — Progress Notes (Addendum)
[facility] Infectious Diseases Follow Up Note  Patient Identification: Patient Name: [name] MRN: [id number] Admit Date: 05/10/2020  6:27 PM Age: 29 y.o.Today's Date: 05/18/2020   Reason for Visit: Follow upon MRSA bacteremia and left knee infection   Principal Problem:   MRSA bacteremia Active Problems:   Renal failure   Polysubstance abuse (HCC)   Sepsis due to cellulitis (HCC)   Hyponatremia   Cellulitis of left lower extremity   IVDU (intravenous drug user)   Antibiotics: Linezolid 9/18-9/21                    Daptomycin Day 6  Assessment 72 Y O male with a h/oIVDU admitted on 9/19 and found to have high grade MRSA bacteremia. MRI Left knee concerning for infection with diffuse cellulitis and small abscesses. TTE 9/20with thin mobile density below the TV annulus. Unclear whether  this is a vegetation or eustacion valve. TEE 9/22 no vegetations. Initially on Linezolid due to concerns of AKI with Vanc. Then changed to Daptomycin at 8mg /kg. Plan was made to continue Daptomycin over the weekend with one dose of Oritavancin on Monday. However, repeat MRI left leg 9/25 was done due to concerns for worsening cellulitis and worsening leukocytosis upto 56 which showed Severe diffuse cellulitis and myofasciitis involving the entire left lower extremity below the knee. This is progressive when compared to the prior study.  On evaluation today, patient complains of severe pain at the left leg that has same severity like yesterday. He has been afebrile but continues to have uptrending Leukocytosis, 61 today. His Left leg although swollen seems to be soft   Recommendations Continue Daptomycin as is  Can add Ciprofloxacin for GN including Pseudomonas coverage given his h/o IVDU , worsening leukocytosis and progressive myofascitis.  Follow WBC,  Follow CPK weekly   Dr 10/25 will see tomorrow.   Rest of the management as per the  primary team. Thank you for the consult. Please page with pertinent questions or concerns.  Drue Second, MD Infectious Diseases  [facility]   To contact the attending provider between 8A-5P or the covering provider during after hours 5P-8A, please log into the web site [email or web address] and access using universal [place] password for that web site. If you do not have the password, please call the hospital operator. ______________________________________________________________________ Subjective patient seen and examined at the bedside. He complains of same severity of pain in the left leg. Denies side effects with the IV abx   Objective BP (!) 146/93 (BP Location: Right Arm)   Pulse (!) 119   Temp 98.6 F (37 C) (Oral)   Resp 20   Ht 5\' 10"  (1.778 m)   Wt 82.4 kg   SpO2 92%   BMI 26.07 kg/m     PHYSICAL EXAM Gen: Alert and oriented x 3 HEENT: [place]/AT, PERLA, LEFT EYE HAS SUBCONJUNCTIVAL HEMORRHAGE AND BRUISE BENEATH THE EYES  Neck: Supple, no lymphadenopathy Cardio: RRR, +S1 and S2, no murmur, TACHYCARDIC Resp: CTAB; no wheezes, rhonchi, or rales GI: Soft, nontender, nondistended, bowel sounds + Musc: LEFT KNEE SWOLLEN , LEFT SHOULDER HAS RESTRICTED ROM  DUE TO PAIN, LEFT ELBOW HAS GOOD ROM Extremities: LEFT LEG SWOLLEN DIFFUSELY FROM LEFT KNEE DOWNWARDS,HEALED SCABS, NO OVERYLYING ERYTHEMA, SEVERELY RESTRICTED ROM DUE TO PAIN Skin: MULTIPLE HEALED SCABS IN THE BILATERAL UE AND LE Neuro: grossly nonfocal Psych: Calm, cooperative   Pertinent Microbiology Results for orders placed or performed during the hospital encounter of 05/10/20  Blood Culture (routine x 2)     Status: Abnormal   Collection Time: 05/10/20  6:43 PM   Specimen: BLOOD RIGHT ARM  Result Value Ref Range Status   Specimen Description BLOOD RIGHT ARM  Final   Special Requests   Final    BOTTLES DRAWN AEROBIC AND ANAEROBIC Blood Culture adequate volume   Culture  Setup Time    Final    GRAM POSITIVE COCCI IN CLUSTERS IN BOTH AEROBIC AND ANAEROBIC BOTTLES CRITICAL RESULT CALLED TO, READ BACK BY AND VERIFIED WITH: Silvio Pate [facility] [id number] AT 1531 BY CM Performed at [facility], [address], [place], [place] [address]    Culture METHICILLIN RESISTANT STAPHYLOCOCCUS AUREUS (A)  Final   Report Status 05/13/2020 FINAL  Final   Organism ID, Bacteria METHICILLIN RESISTANT STAPHYLOCOCCUS AUREUS  Final      Susceptibility   Methicillin resistant staphylococcus aureus - MIC*    CIPROFLOXACIN <=0.5 SENSITIVE Sensitive     ERYTHROMYCIN >=8 RESISTANT Resistant     GENTAMICIN <=0.5 SENSITIVE Sensitive     OXACILLIN >=4 RESISTANT Resistant     TETRACYCLINE <=1 SENSITIVE Sensitive     VANCOMYCIN <=0.5 SENSITIVE Sensitive     TRIMETH/SULFA <=10 SENSITIVE Sensitive     CLINDAMYCIN >=8 RESISTANT Resistant     RIFAMPIN <=0.5 SENSITIVE Sensitive     Inducible Clindamycin NEGATIVE Sensitive     * METHICILLIN RESISTANT STAPHYLOCOCCUS AUREUS  Urine culture     Status: None   Collection Time: 05/10/20  6:43 PM   Specimen: In/Out Cath Urine  Result Value Ref Range Status   Specimen Description   Final    IN/OUT CATH URINE Performed at [facility], [address], [place], [place] [address]    Special Requests   Final    NONE Performed at [facility], [address] [place], [place] [address]    Culture   Final    NO GROWTH Performed at [facility], [address], [place], [place] [address]    Report Status 05/12/2020 FINAL  Final  Blood Culture ID Panel (Reflexed)     Status: Abnormal   Collection Time: 05/10/20  6:43 PM  Result Value Ref Range Status   Enterococcus faecalis NOT DETECTED NOT DETECTED Final   Enterococcus Faecium NOT DETECTED NOT DETECTED Final   Listeria monocytogenes NOT DETECTED NOT DETECTED Final   Staphylococcus species DETECTED (A) NOT DETECTED Final    Comment: CRITICAL RESULT CALLED TO,  READ BACK BY AND VERIFIED WITH: PAHRMD J WAFFORD [id number] AT 1531 BY CM    Staphylococcus aureus (BCID) DETECTED (A) NOT DETECTED Final    Comment: Methicillin (oxacillin)-resistant Staphylococcus aureus (MRSA). MRSA is predictably resistant to beta-lactam antibiotics (except ceftaroline). Preferred therapy is vancomycin unless clinically contraindicated. Patient requires contact precautions if  hospitalized. CRITICAL RESULT CALLED TO, READ BACK BY AND VERIFIED WITH: PHARMD J WAFFORD [id number] AT 1531 BY CM    Staphylococcus epidermidis NOT DETECTED NOT DETECTED Final   Staphylococcus lugdunensis NOT DETECTED NOT DETECTED Final   Streptococcus species NOT DETECTED NOT DETECTED Final   Streptococcus agalactiae NOT DETECTED  NOT DETECTED Final   Streptococcus pneumoniae NOT DETECTED NOT DETECTED Final   Streptococcus pyogenes NOT DETECTED NOT DETECTED Final   A.calcoaceticus-baumannii NOT DETECTED NOT DETECTED Final   Bacteroides fragilis NOT DETECTED NOT DETECTED Final   Enterobacterales NOT DETECTED NOT DETECTED Final   Enterobacter cloacae complex NOT DETECTED NOT DETECTED Final   Escherichia coli NOT DETECTED NOT DETECTED Final   Klebsiella aerogenes NOT DETECTED NOT DETECTED Final   Klebsiella oxytoca NOT DETECTED NOT DETECTED Final   Klebsiella pneumoniae NOT DETECTED NOT DETECTED Final   Proteus species NOT DETECTED NOT DETECTED Final   Salmonella species NOT DETECTED NOT DETECTED Final   Serratia marcescens NOT DETECTED NOT DETECTED Final   Haemophilus influenzae NOT DETECTED NOT DETECTED Final   Neisseria meningitidis NOT DETECTED NOT DETECTED Final   Pseudomonas aeruginosa NOT DETECTED NOT DETECTED Final   Stenotrophomonas maltophilia NOT DETECTED NOT DETECTED Final   Candida albicans NOT DETECTED NOT DETECTED Final   Candida auris NOT DETECTED NOT DETECTED Final   Candida glabrata NOT DETECTED NOT DETECTED Final   Candida krusei NOT DETECTED NOT DETECTED Final   Candida  parapsilosis NOT DETECTED NOT DETECTED Final   Candida tropicalis NOT DETECTED NOT DETECTED Final   Cryptococcus neoformans/gattii NOT DETECTED NOT DETECTED Final   Meth resistant mecA/C and MREJ DETECTED (A) NOT DETECTED Final    Comment: CRITICAL RESULT CALLED TO, READ BACK BY AND VERIFIED WITH: Dorann Lodge [facility] [id number] AT 1531 BY CM Performed at [facility], [address] [place], [place] [address]   Blood Culture (routine x 2)     Status: Abnormal   Collection Time: 05/10/20  6:48 PM   Specimen: BLOOD  Result Value Ref Range Status   Specimen Description   Final    BLOOD RIGHT ANTECUBITAL Performed at [facility], [address] [place], [place] [address]    Special Requests   Final    BOTTLES DRAWN AEROBIC AND ANAEROBIC Blood Culture adequate volume Performed at [facility], [address], [place], [place] [address]    Culture  Setup Time   Final    GRAM POSITIVE COCCI IN CLUSTERS IN BOTH AEROBIC AND ANAEROBIC BOTTLES CRITICAL VALUE NOTED.  VALUE IS CONSISTENT WITH PREVIOUSLY REPORTED AND CALLED VALUE.    Culture (A)  Final    STAPHYLOCOCCUS AUREUS SUSCEPTIBILITIES PERFORMED ON PREVIOUS CULTURE WITHIN THE LAST 5 DAYS. Performed at [facility], [address], [place], [place] [address]    Report Status 05/13/2020 FINAL  Final  SARS Coronavirus 2 by RT PCR (hospital order, performed in [facility] hospital lab) Nasopharyngeal Nasopharyngeal Swab     Status: None   Collection Time: 05/10/20  7:25 PM   Specimen: Nasopharyngeal Swab  Result Value Ref Range Status   SARS Coronavirus 2 NEGATIVE NEGATIVE Final    Comment: (NOTE) SARS-CoV-2 target nucleic acids are NOT DETECTED.  The SARS-CoV-2 RNA is generally detectable in upper and lower respiratory specimens during the acute phase of infection. The lowest concentration of SARS-CoV-2 viral copies this assay can detect is 250 copies / mL. A negative result does not  preclude SARS-CoV-2 infection and should not be used as the sole basis for treatment or other patient management decisions.  A negative result may occur with improper specimen collection / handling, submission of specimen other than nasopharyngeal swab, presence of viral mutation(s) within the areas targeted by this assay, and inadequate number of viral copies (<250 copies / mL).  A negative result must be combined with clinical observations, patient history, and epidemiological information.  Fact Sheet for Patients:   BoilerBrush.com.cyhttps://www.fda.gov/media/136312/download  Fact Sheet for Healthcare Providers: https://pope.com/https://www.fda.gov/media/136313/download  This test is not yet approved or  cleared by the [place] FDA and has been authorized for detection and/or diagnosis of SARS-CoV-2 by FDA under an Emergency Use Authorization (EUA).  This EUA will remain in effect (meaning this test can be used) for the duration of the COVID-19 declaration under Section 564(b)(1) of the Act, 21 U.S.C. section 360bbb-3(b)(1), unless the authorization is terminated or revoked sooner.  Performed at [facility], [address] [place], [place] [address]   Culture, blood (Routine X 2) w Reflex to ID Panel     Status: None   Collection Time: 05/12/20  8:55 AM   Specimen: BLOOD LEFT ARM  Result Value Ref Range Status   Specimen Description BLOOD LEFT ARM  Final   Special Requests   Final    BOTTLES DRAWN AEROBIC ONLY Blood Culture results may not be optimal due to an inadequate volume of blood received in culture bottles   Culture   Final    NO GROWTH 5 DAYS Performed at [facility], [address] [place], [place] [address]    Report Status 05/17/2020 FINAL  Final  Culture, blood (Routine X 2) w Reflex to ID Panel     Status: None   Collection Time: 05/12/20  8:55 AM   Specimen: BLOOD LEFT ARM  Result Value Ref Range Status   Specimen Description BLOOD LEFT ARM  Final   Special  Requests   Final    BOTTLES DRAWN AEROBIC ONLY Blood Culture results may not be optimal due to an inadequate volume of blood received in culture bottles   Culture   Final    NO GROWTH 5 DAYS Performed at [facility], [address] [place], [place] [address]    Report Status 05/17/2020 FINAL  Final     Pertinent Lab. CBC Latest Ref Rng & Units 05/18/2020 05/17/2020 05/17/2020  WBC 4.0 - 10.5 K/uL 61.1(HH) 54.1(HH) 56.9(HH)  Hemoglobin 13.0 - 17.0 g/dL 10.8(L) 11.0(L) 11.6(L)  Hematocrit 39 - 52 % 32.8(L) 33.2(L) 33.9(L)  Platelets 150 - 400 K/uL 503(H) 458(H) 447(H)   CMP Latest Ref Rng & Units 05/18/2020 05/17/2020 05/17/2020  Glucose 70 - 99 mg/dL 98 96 92  BUN 6 - 20 mg/dL 91(Y61(H) 78(G63(H) 95(A63(H)  Creatinine 0.61 - 1.24 mg/dL 2.13(Y1.79(H) 8.65(H2.08(H) 8.46(N2.36(H)  Sodium 135 - 145 mmol/L 124(L) 124(L) 125(L)  Potassium 3.5 - 5.1 mmol/L 6.4(HH) 5.8(H) 5.4(H)  Chloride 98 - 111 mmol/L 93(L) 96(L) 97(L)  CO2 22 - 32 mmol/L 19(L) 18(L) 19(L)  Calcium 8.9 - 10.3 mg/dL 7.5(L) 7.5(L) 7.6(L)  Total Protein 6.5 - 8.1 g/dL 5.3(L) - -  Total Bilirubin 0.3 - 1.2 mg/dL 0.3 - -  Alkaline Phos 38 - 126 U/L 138(H) - -  AST 15 - 41 U/L 35 - -  ALT 0 - 44 U/L 21 - -    Pertinent Imaging today Plain films and CT images have been personally visualized and interpreted; radiology reports have been reviewed. Decision making incorporated into the Impression / Recommendations.  MRI Left Foot and Left Tibia and Fibula 05/17/20 FINDINGS: Severe diffuse cellulitis and myofasciitis involving the entire left lower extremity below the knee. Findings are progressive when compared to the prior study, particularly the myofasciitis. There is diffuse edema like signal changes in the  anterior and posterior compartments of the leg and foot and interfascial fluid. I do not see any discrete rim enhancing abscess or evidence of pyomyositis. No MR findings suspicious for septic arthritis or osteomyelitis.  IMPRESSION: 1.  Severe diffuse cellulitis and myofasciitis involving the entire left lower extremity below the knee. This is progressive when compared to the prior study. 2. No discrete rim enhancing abscess or evidence of pyomyositis. 3. No MR findings suspicious for septic arthritis or osteomyelitis.

## 2020-05-18 NOTE — Progress Notes (Signed)
Critical potassium lab reported by charlie/lab resulting at 6.4, I have sent text page to dr Benjamine Mola

## 2020-05-18 NOTE — SANE Note (Signed)
Forensic Consult not completed due to pt declining services.  Printed FNE resources/ information given to [employer] (pastoral care) to give to the pt for reference and follow up if he so desires.

## 2020-05-19 ENCOUNTER — Inpatient Hospital Stay (HOSPITAL_COMMUNITY): Payer: Self-pay

## 2020-05-19 DIAGNOSIS — B9562 Methicillin resistant Staphylococcus aureus infection as the cause of diseases classified elsewhere: Secondary | ICD-10-CM

## 2020-05-19 DIAGNOSIS — R7881 Bacteremia: Secondary | ICD-10-CM

## 2020-05-19 LAB — CBC
HCT: 30.9 % — ABNORMAL LOW (ref 39.0–52.0)
Hemoglobin: 10 g/dL — ABNORMAL LOW (ref 13.0–17.0)
MCH: 27.7 pg (ref 26.0–34.0)
MCHC: 32.4 g/dL (ref 30.0–36.0)
MCV: 85.6 fL (ref 80.0–100.0)
Platelets: 435 10*3/uL — ABNORMAL HIGH (ref 150–400)
RBC: 3.61 MIL/uL — ABNORMAL LOW (ref 4.22–5.81)
RDW: 13.2 % (ref 11.5–15.5)
WBC: 43 10*3/uL — ABNORMAL HIGH (ref 4.0–10.5)
nRBC: 0 % (ref 0.0–0.2)

## 2020-05-19 LAB — BASIC METABOLIC PANEL
Anion gap: 9 (ref 5–15)
BUN: 62 mg/dL — ABNORMAL HIGH (ref 6–20)
CO2: 22 mmol/L (ref 22–32)
Calcium: 7.3 mg/dL — ABNORMAL LOW (ref 8.9–10.3)
Chloride: 96 mmol/L — ABNORMAL LOW (ref 98–111)
Creatinine, Ser: 1.68 mg/dL — ABNORMAL HIGH (ref 0.61–1.24)
GFR calc Af Amer: >60 mL/min (ref >60)
GFR calc non Af Amer: 54 mL/min — ABNORMAL LOW (ref >60)
Glucose, Bld: 96 mg/dL (ref 70–99)
Potassium: 4.9 mmol/L (ref 3.5–5.1)
Sodium: 127 mmol/L — ABNORMAL LOW (ref 135–145)

## 2020-05-19 LAB — BODY FLUID CELL COUNT WITH DIFFERENTIAL
Eos, Fluid: 0 %
Lymphs, Fluid: 18 %
Monocyte-Macrophage-Serous Fluid: 23 % — ABNORMAL LOW (ref 50–90)
Neutrophil Count, Fluid: 59 % — ABNORMAL HIGH (ref 0–25)
Total Nucleated Cell Count, Fluid: 440 cu mm (ref 0–1000)

## 2020-05-19 LAB — LACTATE DEHYDROGENASE, PLEURAL OR PERITONEAL FLUID: LD, Fluid: 66 U/L — ABNORMAL HIGH (ref 3–23)

## 2020-05-19 LAB — PROTEIN, PLEURAL OR PERITONEAL FLUID: Total protein, fluid: <3 g/dL

## 2020-05-19 LAB — ALBUMIN, PLEURAL OR PERITONEAL FLUID: Albumin, Fluid: <1 g/dL

## 2020-05-19 IMAGING — DX DG CHEST 1V PORT
1 series · 1 of 1 positions shown · non-contrast
Comparison: Radiograph [DATE], CT abdomen pelvis [DATE]

CLINICAL DATA: Post thoracentesis

EXAM:
PORTABLE CHEST 1 VIEW

[chest ap]
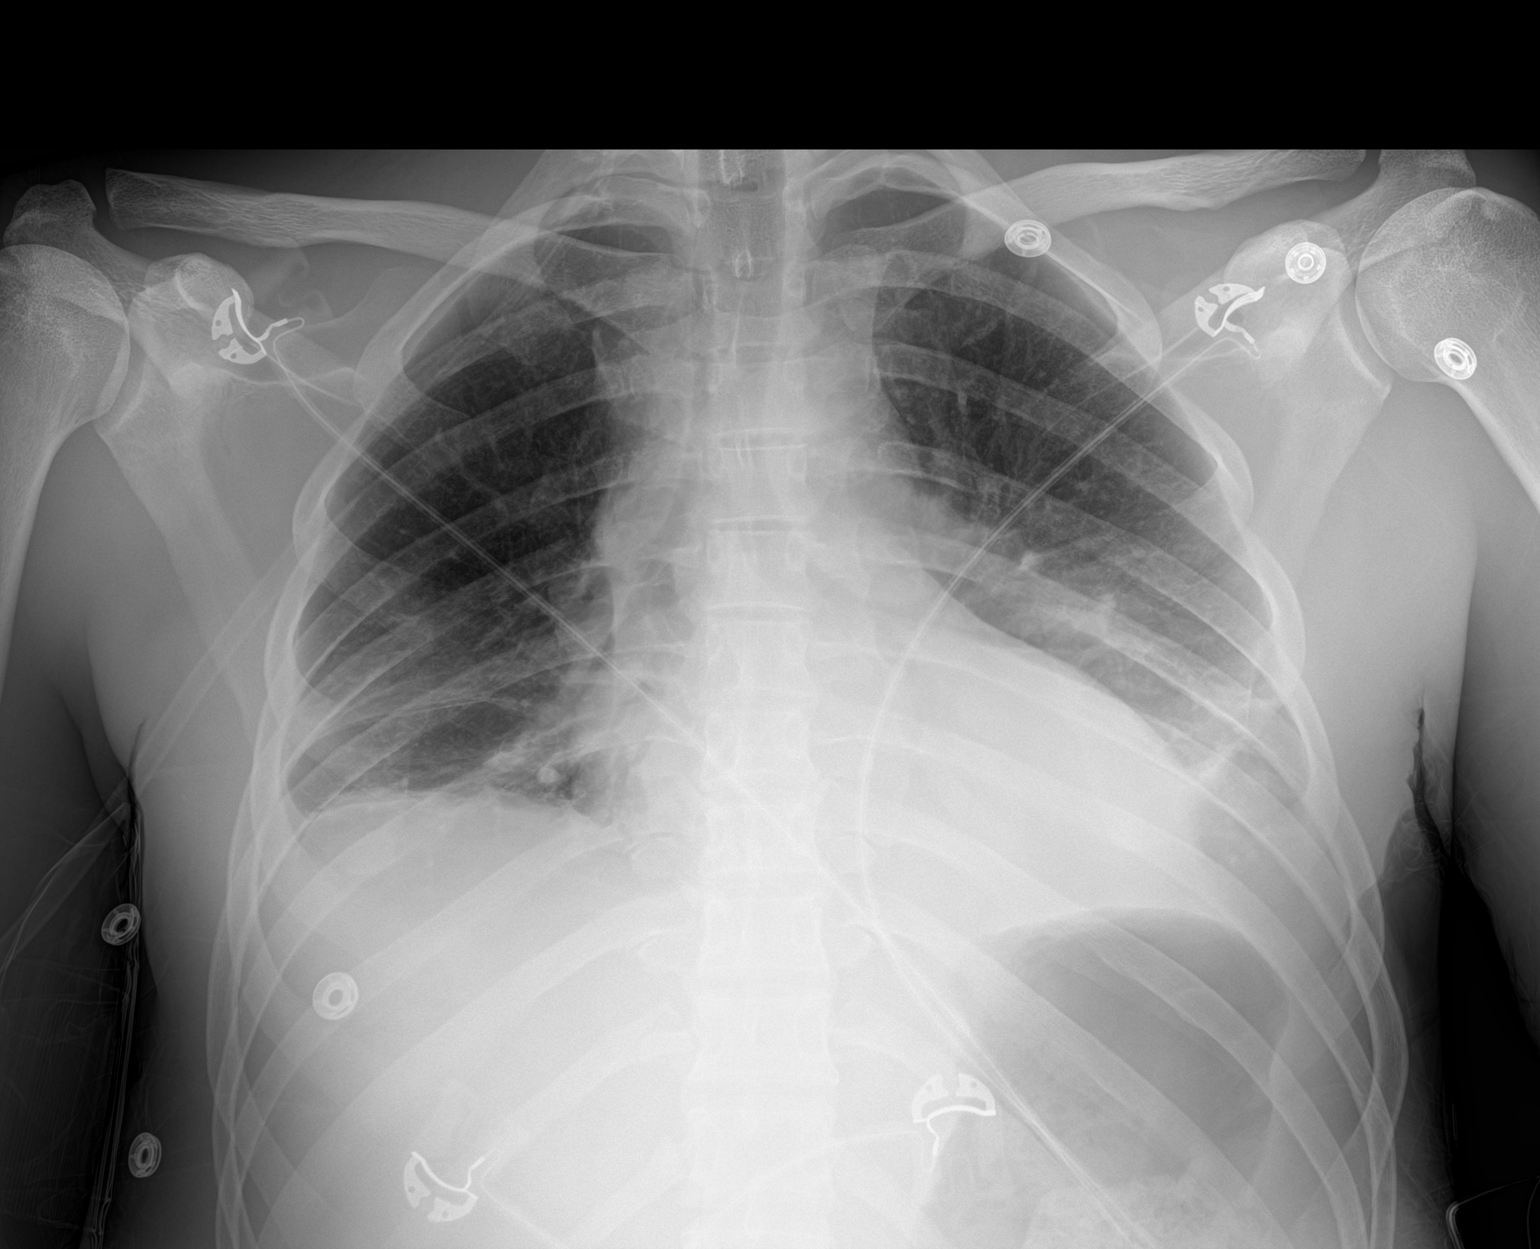

[1 of 1 positions shown; findings below may reference images not displayed]

FINDINGS: There are small residual bilateral pleural effusions, decreased on
the left. Question some lucency along the left costophrenic sulcus
however which could reflect a small amount of gas within the pleural
space. Streaky and hazy opacities in the mid to lower lungs likely
reflect areas of residual atelectasis and/or airspace disease.
Cardiomediastinal contours are stable. No acute osseous or soft
tissue abnormality. Telemetry leads and nasal cannula overlie the
chest.
IMPRESSION: 1. Small residual bilateral pleural effusions, decreased on the
left.
2. Residual opacities in the mid to lower lungs likely reflect areas
of residual atelectasis and/or airspace disease.
3. Question a small visible pleural line with lateral lucency along
the left costophrenic sulcus. Which could reflect a small amount of
gas within the pleural space, possibly procedure related.

## 2020-05-19 IMAGING — DX DG CHEST 1V
1 series · 1 of 1 positions shown · non-contrast
Comparison: CT Abdomen and Pelvis [8T] hours yesterday, and
earlier.

CLINICAL DATA: 29-year-old male status post thoracentesis.

EXAM:
CHEST  1 VIEW

[chest]
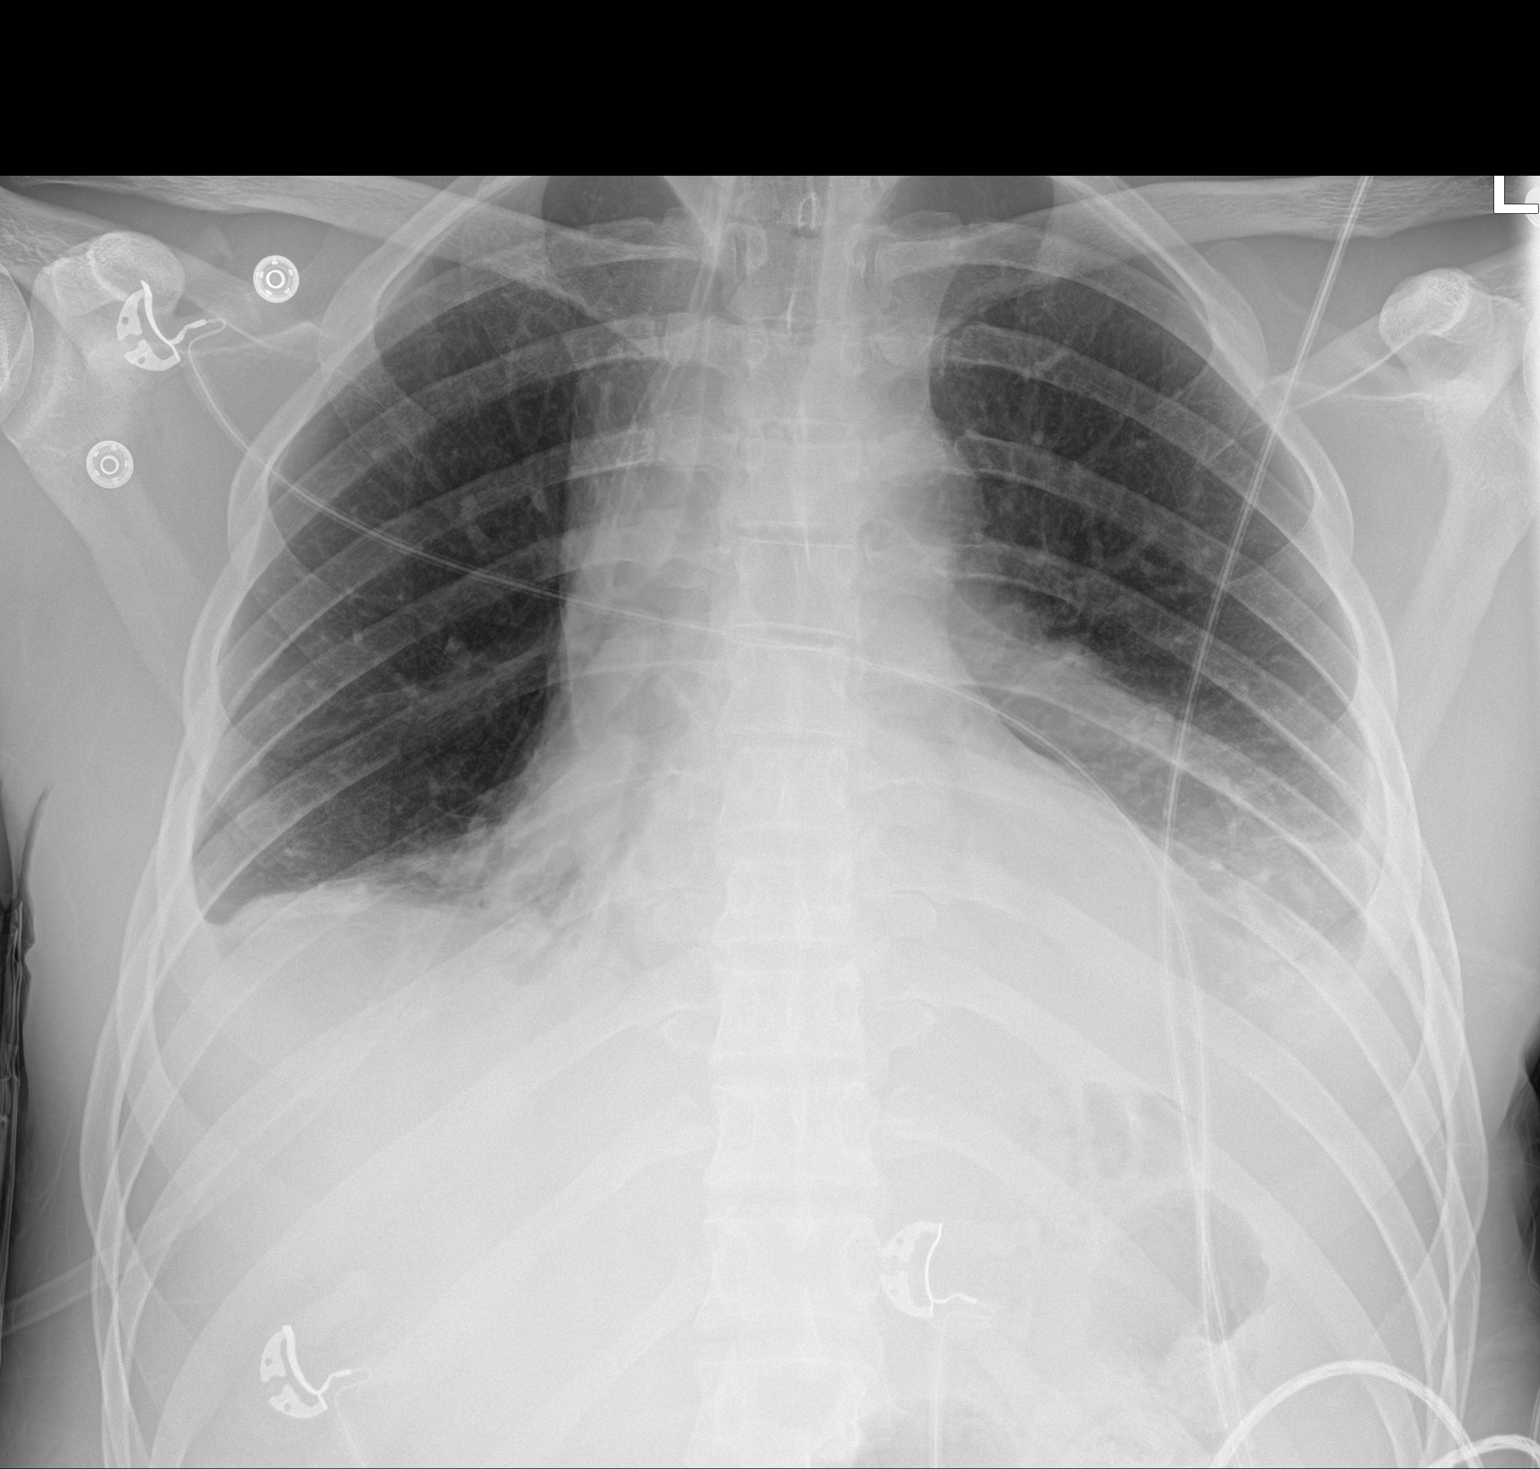

[1 of 1 positions shown; findings below may reference images not displayed]

FINDINGS: Portable AP semi upright view at [8T] hours. Substantially decreased
veiling opacity on the right from a portable chest yesterday. No
pneumothorax identified. Small residual right pleural effusion now.
Improved right lung base ventilation. Probably stable small to
moderate left pleural effusion. Stable cardiac size and mediastinal
contours. Visualized tracheal air column is within normal limits. No
pulmonary edema. No areas of worsening ventilation.
IMPRESSION: 1. Following thoracentesis the right pleural effusion has decreased
and no pneumothorax is identified.
2. No areas of worsening ventilation.

## 2020-05-19 MED ORDER — TRAZODONE HCL 100 MG PO TABS
100.0000 mg | ORAL_TABLET | Freq: Every evening | ORAL | Status: DC | PRN
  Administered 2020-05-19 – 2020-06-30 (×26): 100 mg via ORAL
  Filled 2020-05-19 (×29): qty 1

## 2020-05-19 MED ORDER — TRAZODONE HCL 100 MG PO TABS
100.0000 mg | ORAL_TABLET | Freq: Every evening | ORAL | Status: AC | PRN
  Administered 2020-05-19: 100 mg via ORAL
  Filled 2020-05-19: qty 1

## 2020-05-19 MED ORDER — ORITAVANCIN DIPHOSPHATE 400 MG IV SOLR
1200.0000 mg | Freq: Once | INTRAVENOUS | Status: DC
  Filled 2020-05-19: qty 120

## 2020-05-19 MED ORDER — FUROSEMIDE 10 MG/ML IJ SOLN
20.0000 mg | Freq: Once | INTRAMUSCULAR | Status: AC
  Administered 2020-05-19: 20 mg via INTRAVENOUS
  Filled 2020-05-19: qty 2

## 2020-05-19 NOTE — Progress Notes (Signed)
Assisted Dr Smith with right thoracentesis.  Patient tolerated well 

## 2020-05-19 NOTE — Plan of Care (Signed)
?  Problem: Coping: ?Goal: Level of anxiety will decrease ?Outcome: Progressing ?  ?Problem: Safety: ?Goal: Ability to remain free from injury will improve ?Outcome: Progressing ?  ?

## 2020-05-19 NOTE — Procedures (Signed)
Thoracentesis  Procedure Note  [name]  [id number]  03/13/1991  Date:05/19/20  Time:3:37 PM   Provider Performing:Raivyn Kabler C Katrinka Blazing   Procedure: Thoracentesis with imaging guidance (43329)  Indication(s) Pleural Effusion  Consent Risks of the procedure as well as the alternatives and risks of each were explained to the patient and/or caregiver.  Consent for the procedure was obtained and is signed in the bedside chart  Anesthesia Topical only with 1% lidocaine    Time Out Verified patient identification, verified procedure, site/side was marked, verified correct patient position, special equipment/implants available, medications/allergies/relevant history reviewed, required imaging and test results available.   Sterile Technique Maximal sterile technique including full sterile barrier drape, hand hygiene, sterile gown, sterile gloves, mask, hair covering, sterile ultrasound probe cover (if used).  Procedure Description Ultrasound was used to identify appropriate pleural anatomy for placement and overlying skin marked.  Area of drainage cleaned and draped in sterile fashion. Lidocaine was used to anesthetize the skin and subcutaneous tissue.  700 cc's of straw appearing fluid was drained from the right pleural space. Catheter then removed and bandaid applied to site.   Complications/Tolerance None; patient tolerated the procedure well. Chest X-ray is ordered to confirm no post-procedural complication.   EBL Minimal   Specimen(s) Pleural fluid

## 2020-05-19 NOTE — Progress Notes (Signed)
[facility]  Date of Admission:  05/10/2020     Total days of antibiotics 10         ASSESSMENT:  Mr. [name] is currently on Day 7 of treatment with Daptomycin at 10 mg/kg for MRSA bacteremia and cellulitis of the left lower extremity. Bacteremia cleared on 9/20 and WBC count is now improving. Recommend another 7 days of Daptomycin and currently he is willing to stay.  Would be okay with as little 3 days for 10 day course followed by a dose of oritivancin in either situation.  Latest CK level of 18. Renal function continues to improve.  Will continue ciprofloxacin for another 24 hours and then stop as this is likely related to MRSA infection.  PLAN:  1. Continue current dose of daptomycin at 10 mg/kg and ciprofloxacin.  2. Monitor CK levels per protocol while on Daptomycin.  3. Pain management per primary team 4. Heroin counseling/treatment per primary team.   Principal Problem:   MRSA bacteremia Active Problems:   Renal failure   Polysubstance abuse (HCC)   Sepsis due to cellulitis (HCC)   Hyponatremia   Cellulitis of left lower extremity   IVDU (intravenous drug user)   . cloNIDine  0.1 mg Oral BID  . feeding supplement (NEPRO CARB STEADY)  237 mL Oral TID BM  . heparin  5,000 Units Subcutaneous Q8H  . lidocaine  1 patch Transdermal Q24H  . methocarbamol  500 mg Oral TID  . oxymetazoline  1 spray Each Nare BID  . senna-docusate  2 tablet Oral BID    SUBJECTIVE:  Afebrile overnight with no acute events. Leukocytosis improved from 54 down to 43. Having generalized pain worse in left shoulder and left calf.   No Known Allergies   Review of Systems: Review of Systems  Constitutional: Negative for chills, fever and weight loss.  Respiratory: Negative for cough, shortness of breath and wheezing.   Cardiovascular: Negative for chest pain and leg swelling.  Gastrointestinal: Negative for abdominal pain, constipation, diarrhea, nausea and  vomiting.  Skin: Negative for rash.      OBJECTIVE: Vitals:   05/19/20 0300 05/19/20 0316 05/19/20 0400 05/19/20 0756  BP: (!) 144/96  (!) 143/94   Pulse: (!) 113  (!) 107   Resp: 20  20   Temp: 98.5 F (36.9 C)   98.4 F (36.9 C)  TempSrc: Oral   Oral  SpO2: 94%  96%   Weight:  79.7 kg    Height:       Body mass index is 25.21 kg/m.  Physical Exam Constitutional:      General: He is not in acute distress.    Appearance: He is well-developed.     Comments: Sleeping but easily arousable.   Cardiovascular:     Rate and Rhythm: Regular rhythm. Tachycardia present.     Heart sounds: Normal heart sounds.  Pulmonary:     Effort: Pulmonary effort is normal.     Breath sounds: Normal breath sounds.  Musculoskeletal:     Comments: Gross edema of the left upper extremity and left lower calf compared to contralateral side. Pulses are intact and appropriate.   Skin:    General: Skin is warm and dry.  Neurological:     Mental Status: He is alert and oriented to person, place, and time.  Psychiatric:        Behavior: Behavior normal.        Thought Content: Thought content normal.  Judgment: Judgment normal.     Lab Results Lab Results  Component Value Date   WBC 43.0 (H) 05/19/2020   HGB 10.0 (L) 05/19/2020   HCT 30.9 (L) 05/19/2020   MCV 85.6 05/19/2020   PLT 435 (H) 05/19/2020    Lab Results  Component Value Date   CREATININE 1.68 (H) 05/19/2020   BUN 62 (H) 05/19/2020   NA 127 (L) 05/19/2020   K 4.9 05/19/2020   CL 96 (L) 05/19/2020   CO2 22 05/19/2020    Lab Results  Component Value Date   ALT 21 05/18/2020   AST 35 05/18/2020   ALKPHOS 138 (H) 05/18/2020   BILITOT 0.3 05/18/2020     Microbiology: Recent Results (from the past 240 hour(s))  Blood Culture (routine x 2)     Status: Abnormal   Collection Time: 05/10/20  6:43 PM   Specimen: BLOOD RIGHT ARM  Result Value Ref Range Status   Specimen Description BLOOD RIGHT ARM  Final   Special  Requests   Final    BOTTLES DRAWN AEROBIC AND ANAEROBIC Blood Culture adequate volume   Culture  Setup Time   Final    GRAM POSITIVE COCCI IN CLUSTERS IN BOTH AEROBIC AND ANAEROBIC BOTTLES CRITICAL RESULT CALLED TO, READ BACK BY AND VERIFIED WITH: Silvio Pate [facility] [id number] AT 1531 BY CM Performed at [facility], [address], [place], [place] [address]    Culture METHICILLIN RESISTANT STAPHYLOCOCCUS AUREUS (A)  Final   Report Status 05/13/2020 FINAL  Final   Organism ID, Bacteria METHICILLIN RESISTANT STAPHYLOCOCCUS AUREUS  Final      Susceptibility   Methicillin resistant staphylococcus aureus - MIC*    CIPROFLOXACIN <=0.5 SENSITIVE Sensitive     ERYTHROMYCIN >=8 RESISTANT Resistant     GENTAMICIN <=0.5 SENSITIVE Sensitive     OXACILLIN >=4 RESISTANT Resistant     TETRACYCLINE <=1 SENSITIVE Sensitive     VANCOMYCIN <=0.5 SENSITIVE Sensitive     TRIMETH/SULFA <=10 SENSITIVE Sensitive     CLINDAMYCIN >=8 RESISTANT Resistant     RIFAMPIN <=0.5 SENSITIVE Sensitive     Inducible Clindamycin NEGATIVE Sensitive     * METHICILLIN RESISTANT STAPHYLOCOCCUS AUREUS  Urine culture     Status: None   Collection Time: 05/10/20  6:43 PM   Specimen: In/Out Cath Urine  Result Value Ref Range Status   Specimen Description   Final    IN/OUT CATH URINE Performed at [facility], [address], [place], [place] [address]    Special Requests   Final    NONE Performed at [facility], [address] [place], [place] [address]    Culture   Final    NO GROWTH Performed at [facility], [address] [place], [place] [address]    Report Status 05/12/2020 FINAL  Final  Blood Culture ID Panel (Reflexed)     Status: Abnormal   Collection Time: 05/10/20  6:43 PM  Result Value Ref Range Status   Enterococcus faecalis NOT DETECTED NOT DETECTED Final   Enterococcus Faecium NOT DETECTED NOT DETECTED Final   Listeria monocytogenes NOT DETECTED NOT  DETECTED Final   Staphylococcus species DETECTED (A) NOT DETECTED Final    Comment: CRITICAL RESULT CALLED TO, READ BACK BY AND VERIFIED WITH: PAHRMD J WAFFORD [id number] AT 1531 BY CM    Staphylococcus aureus (BCID) DETECTED (A) NOT DETECTED Final    Comment: Methicillin (oxacillin)-resistant Staphylococcus aureus (MRSA). MRSA is predictably resistant to  beta-lactam antibiotics (except ceftaroline). Preferred therapy is vancomycin unless clinically contraindicated. Patient requires contact precautions if  hospitalized. CRITICAL RESULT CALLED TO, READ BACK BY AND VERIFIED WITH: PHARMD J WAFFORD [id number] AT 1531 BY CM    Staphylococcus epidermidis NOT DETECTED NOT DETECTED Final   Staphylococcus lugdunensis NOT DETECTED NOT DETECTED Final   Streptococcus species NOT DETECTED NOT DETECTED Final   Streptococcus agalactiae NOT DETECTED NOT DETECTED Final   Streptococcus pneumoniae NOT DETECTED NOT DETECTED Final   Streptococcus pyogenes NOT DETECTED NOT DETECTED Final   A.calcoaceticus-baumannii NOT DETECTED NOT DETECTED Final   Bacteroides fragilis NOT DETECTED NOT DETECTED Final   Enterobacterales NOT DETECTED NOT DETECTED Final   Enterobacter cloacae complex NOT DETECTED NOT DETECTED Final   Escherichia coli NOT DETECTED NOT DETECTED Final   Klebsiella aerogenes NOT DETECTED NOT DETECTED Final   Klebsiella oxytoca NOT DETECTED NOT DETECTED Final   Klebsiella pneumoniae NOT DETECTED NOT DETECTED Final   Proteus species NOT DETECTED NOT DETECTED Final   Salmonella species NOT DETECTED NOT DETECTED Final   Serratia marcescens NOT DETECTED NOT DETECTED Final   Haemophilus influenzae NOT DETECTED NOT DETECTED Final   Neisseria meningitidis NOT DETECTED NOT DETECTED Final   Pseudomonas aeruginosa NOT DETECTED NOT DETECTED Final   Stenotrophomonas maltophilia NOT DETECTED NOT DETECTED Final   Candida albicans NOT DETECTED NOT DETECTED Final   Candida auris NOT DETECTED NOT DETECTED Final    Candida glabrata NOT DETECTED NOT DETECTED Final   Candida krusei NOT DETECTED NOT DETECTED Final   Candida parapsilosis NOT DETECTED NOT DETECTED Final   Candida tropicalis NOT DETECTED NOT DETECTED Final   Cryptococcus neoformans/gattii NOT DETECTED NOT DETECTED Final   Meth resistant mecA/C and MREJ DETECTED (A) NOT DETECTED Final    Comment: CRITICAL RESULT CALLED TO, READ BACK BY AND VERIFIED WITH: Dorann Lodge [facility] [id number] AT 1531 BY CM Performed at [facility], [address], [place], [place] [address]   Blood Culture (routine x 2)     Status: Abnormal   Collection Time: 05/10/20  6:48 PM   Specimen: BLOOD  Result Value Ref Range Status   Specimen Description   Final    BLOOD RIGHT ANTECUBITAL Performed at [facility], [address] [place], [place] [address]    Special Requests   Final    BOTTLES DRAWN AEROBIC AND ANAEROBIC Blood Culture adequate volume Performed at [facility], [address], [place], [place] [address]    Culture  Setup Time   Final    GRAM POSITIVE COCCI IN CLUSTERS IN BOTH AEROBIC AND ANAEROBIC BOTTLES CRITICAL VALUE NOTED.  VALUE IS CONSISTENT WITH PREVIOUSLY REPORTED AND CALLED VALUE.    Culture (A)  Final    STAPHYLOCOCCUS AUREUS SUSCEPTIBILITIES PERFORMED ON PREVIOUS CULTURE WITHIN THE LAST 5 DAYS. Performed at [facility], [address] [place], [place] [address]    Report Status 05/13/2020 FINAL  Final  SARS Coronavirus 2 by RT PCR (hospital order, performed in [facility] hospital lab) Nasopharyngeal Nasopharyngeal Swab     Status: None   Collection Time: 05/10/20  7:25 PM   Specimen: Nasopharyngeal Swab  Result Value Ref Range Status   SARS Coronavirus 2 NEGATIVE NEGATIVE Final    Comment: (NOTE) SARS-CoV-2 target nucleic acids are NOT DETECTED.  The SARS-CoV-2 RNA is generally detectable in upper and lower respiratory specimens during the acute phase of infection. The  lowest concentration of SARS-CoV-2 viral copies this assay can  detect is 250 copies / mL. A negative result does not preclude SARS-CoV-2 infection and should not be used as the sole basis for treatment or other patient management decisions.  A negative result may occur with improper specimen collection / handling, submission of specimen other than nasopharyngeal swab, presence of viral mutation(s) within the areas targeted by this assay, and inadequate number of viral copies (<250 copies / mL). A negative result must be combined with clinical observations, patient history, and epidemiological information.  Fact Sheet for Patients:   BoilerBrush.com.cy  Fact Sheet for Healthcare Providers: https://pope.com/  This test is not yet approved or  cleared by the [place] FDA and has been authorized for detection and/or diagnosis of SARS-CoV-2 by FDA under an Emergency Use Authorization (EUA).  This EUA will remain in effect (meaning this test can be used) for the duration of the COVID-19 declaration under Section 564(b)(1) of the Act, 21 U.S.C. section 360bbb-3(b)(1), unless the authorization is terminated or revoked sooner.  Performed at [facility], [address], [place], [place] [address]   Culture, blood (Routine X 2) w Reflex to ID Panel     Status: None   Collection Time: 05/12/20  8:55 AM   Specimen: BLOOD LEFT ARM  Result Value Ref Range Status   Specimen Description BLOOD LEFT ARM  Final   Special Requests   Final    BOTTLES DRAWN AEROBIC ONLY Blood Culture results may not be optimal due to an inadequate volume of blood received in culture bottles   Culture   Final    NO GROWTH 5 DAYS Performed at [facility], [address], [place], [place] [address]    Report Status 05/17/2020 FINAL  Final  Culture, blood (Routine X 2) w Reflex to ID Panel     Status: None   Collection Time: 05/12/20  8:55  AM   Specimen: BLOOD LEFT ARM  Result Value Ref Range Status   Specimen Description BLOOD LEFT ARM  Final   Special Requests   Final    BOTTLES DRAWN AEROBIC ONLY Blood Culture results may not be optimal due to an inadequate volume of blood received in culture bottles   Culture   Final    NO GROWTH 5 DAYS Performed at [facility], [address] [place], [place] [address]    Report Status 05/17/2020 FINAL  Final     Marcos Eke, NP [facility]  05/19/2020  10:32 AM
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
## 2020-05-19 NOTE — Progress Notes (Signed)
Pt. C/o severe pain to R lung post thoracentesis. On call for [facility] paged to make aware. PRN pain medication administered by previous RN.

## 2020-05-19 NOTE — Consult Note (Addendum)
NAME:  [name], MRN:  [id number], DOB:  1991-01-23, LOS: 9 ADMISSION DATE:  05/10/2020, CONSULTATION DATE:  05/19/20 REFERRING MD:  Benjamine Mola, CHIEF COMPLAINT:  Leg pain   Brief History   29 year old man with hx of IV heroin abuse presenting with MRSA bacteremia.  History of present illness   29 year old man with hx of IV heroin abuse presenting with MRSA bacteremia.  Concern for persistent uncontrolled infection so CT a/p ordered showing pleural effusion.  PCCM asked to help manage effusion.  Patient c/o acute on chronic breathlessness.  Has baseline mild asthma with PRN albuterol.  Breathing has worsened since admission, worse in supine position, associated with pleurisy.  Has a dry cough.  Denies hemoptysis.  Denies any IV drug use for past 6 months.  Past Medical History  IVDA Homelessness Asthma  Significant Hospital Events   9/18 admitted  Consults:  ID PCCM  Procedures:  TEE 9/22: no IE  Significant Diagnostic Tests:  LE duplex neg CT Abd IMPRESSION: 1. Large bilateral pleural effusions with basilar atelectasis and consolidation bilaterally. 2. Moderate diffuse free fluid throughout the abdomen and pelvis. Edema in the mesentery and throughout the subcutaneous fat. No obvious loculated collections. Changes likely indicate anasarca. 3. No evidence of bowel obstruction or inflammation.  CXR bilateral effusions personally reviewed  Micro Data:  9/18 blood culture + MRSA  Antimicrobials:  Daptomycin>> Cipro>>   Interim history/subjective:  Consulted  Objective   Blood pressure 139/81, pulse (!) 106, temperature 98.6 F (37 C), temperature source Oral, resp. rate (!) 28, height 5\' 10"  (1.778 m), weight 79.7 kg, SpO2 95 %.        Intake/Output Summary (Last 24 hours) at 05/19/2020 1518 Last data filed at 05/19/2020 1243 Gross per 24 hour  Intake 1217 ml  Output 2950 ml  Net -1733 ml   Filed Weights   05/17/20 0518 05/18/20 0300 05/19/20 0316  Weight:  81.2 kg 82.4 kg 79.7 kg    Examination: Constitutional: young man in no acute distress Eyes: eyes are anicteric, reactive to light Ears, nose, mouth, and throat: mucous membranes dry, trachea midline Cardiovascular: heart sounds are regular, ext are warm to touch. diffuse edema Respiratory: Diminished bases, bilateral moderate effusions on 05/21/20 Gastrointestinal: abdomen is soft with + BS Skin: multiple tract marks, pale, +tatoos, pain to palpation of any joint or extremity Neurologic: moves all 4 ext to command, profoundly weak Psychiatric: anxious, poor insight  Sodium Low Albumin 1.1 WBC 43 improved from 61 Cr 1.68 improving  Resolved Hospital Problem list   N/A  Assessment & Plan:  Acute resp failure, hypoxemic related to bilateral pleural effusions and anasarca related to poor serum oncotic pressure and fluid resuscitation for sepsis, immobility, weight appears up at least 20 lbs.  AKI improving  MRSA bacteremia and soft tissue infection related to IVDA  - Tap R effusion for infection and to help breathing - Usual pleural fluid studies sent - Consider albumin supplementation and diuretics, formally re-engaging nephrology may help, he has a good deal of protein/blood/wbc in urine - He needs to eat more and mobilize more, this was discussed with him - If languishes consider palliative care consult  PCCM available PRN  Best practice:  Diet: fluid restricted Pain/Anxiety/Delirium protocol (if indicated): limit as able VAP protocol (if indicated): n/a DVT prophylaxis: heparin GI prophylaxis: n/a Glucose control: per primary Mobility: up to chair if he is willing Code Status: full Family Communication: per primary Disposition:  Per  primary   Labs   CBC: Recent Labs  Lab 05/13/20 0356 05/14/20 0534 05/16/20 0905 05/17/20 0600 05/17/20 1735 05/18/20 1159 05/19/20 0447  WBC 17.1*   < > 54.3* 56.9* 54.1* 61.1* 43.0*  NEUTROABS 12.0*  --   --  55.2*  --  54.7*  --    HGB 11.4*   < > 11.7* 11.6* 11.0* 10.8* 10.0*  HCT 33.5*   < > 35.0* 33.9* 33.2* 32.8* 30.9*  MCV 85.0   < > 83.9 83.3 83.6 84.3 85.6  PLT 501*   < > 440* 447* 458* 503* 435*   < > = values in this interval not displayed.    Basic Metabolic Panel: Recent Labs  Lab 05/13/20 0356 05/13/20 0356 05/14/20 0534 05/14/20 0534 05/15/20 1003 05/15/20 1003 05/16/20 0905 05/16/20 0905 05/16/20 1709 05/17/20 0600 05/17/20 1735 05/18/20 1159 05/19/20 0447  NA 132*   < > 129*   < > 126*   < > 124*   < > 123* 125* 124* 124* 127*  K 4.9   < > 4.9   < > 5.2*   < > 5.4*   < > 5.8* 5.4* 5.8* 6.4* 4.9  CL 105   < > 100   < > 98   < > 95*   < > 96* 97* 96* 93* 96*  CO2 19*   < > 20*   < > 20*   < > 16*   < > 18* 19* 18* 19* 22  GLUCOSE 101*   < > 106*   < > 110*   < > 110*   < > 107* 92 96 98 96  BUN 59*   < > 53*   < > 54*   < > 57*   < > 59* 63* 63* 61* 62*  CREATININE 2.79*   < > 2.80*   < > 2.76*   < > 2.55*   < > 2.52* 2.36* 2.08* 1.79* 1.68*  CALCIUM 7.7*   < > 7.9*   < > 7.8*   < > 7.6*   < > 7.5* 7.6* 7.5* 7.5* 7.3*  PHOS 3.5  --  4.8*  --  5.8*  --  6.1*  --   --   --   --   --   --    < > = values in this interval not displayed.   GFR: Estimated Creatinine Clearance: 67 mL/min (A) (by C-G formula based on SCr of 1.68 mg/dL (H)). Recent Labs  Lab 05/17/20 0600 05/17/20 1735 05/18/20 1159 05/19/20 0447  WBC 56.9* 54.1* 61.1* 43.0*    Liver Function Tests: Recent Labs  Lab 05/13/20 0356 05/14/20 0534 05/15/20 1003 05/16/20 0905 05/18/20 1159  AST  --   --   --   --  35  ALT  --   --   --   --  21  ALKPHOS  --   --   --   --  138*  BILITOT  --   --   --   --  0.3  PROT  --   --   --   --  5.3*  ALBUMIN 1.5* 1.4* 1.4* 1.2* 1.1*   Recent Labs  Lab 05/15/20 1003  LIPASE 25   No results for input(s): AMMONIA in the last 168 hours.  ABG    Component Value Date/Time   TCO2 18 (L) 05/10/2020 1937     Coagulation Profile: No results for input(s): INR, PROTIME in  the last 168 hours.  Cardiac Enzymes: Recent Labs  Lab 05/13/20 1125 05/16/20 1110 05/17/20 1351  CKTOTAL 72 24* 18*    HbA1C: No results found for: HGBA1C  CBG: No results for input(s): GLUCAP in the last 168 hours.  Review of Systems:    Positive Symptoms in bold:  Constitutional fevers, chills, weight loss, fatigue, anorexia, malaise  Eyes decreased vision, double vision, eye irritation  Ears, Nose, Mouth, Throat sore throat, trouble swallowing, sinus congestion  Cardiovascular chest pain, paroxysmal nocturnal dyspnea, lower ext edema, palpitations   Respiratory SOB, cough, DOE, hemoptysis, wheezing  Gastrointestinal nausea, vomiting, diarrhea  Genitourinary burning with urination, trouble urinating  Musculoskeletal joint aches, joint swelling, back pain  Integumentary  rashes, skin lesions  Neurological focal weakness, focal numbness, trouble speaking, headaches  Psychiatric depression, anxiety, confusion  Endocrine polyuria, polydipsia, cold intolerance, heat intolerance  Hematologic abnormal bruising, abnormal bleeding, unexplained nose bleeds  Allergic/Immunologic recurrent infections, hives, swollen lymph nodes     Past Medical History  He,  has a past medical history of Asthma, Back pain, IVDU (intravenous drug user) (05/11/2020), MRSA bacteremia (05/11/2020), Septic arthritis of shoulder, left (HCC) (05/11/2020), and Septic joint of right shoulder region (HCC) (05/11/2020).   Surgical History    Past Surgical History:  Procedure Laterality Date  . TEE WITHOUT CARDIOVERSION N/A 05/14/2020   Procedure: TRANSESOPHAGEAL ECHOCARDIOGRAM (TEE);  Surgeon: Thurmon Fair, MD;  Location: [facility] ENDOSCOPY;  Service: Cardiovascular;  Laterality: N/A;  . TYMPANOSTOMY TUBE PLACEMENT       Social History   reports that he has been smoking. He has never used smokeless tobacco. He reports current alcohol use. He reports current drug use. Drugs: IV and Methamphetamines.   Family  History   His family history is not on file.   Allergies No Known Allergies   Home Medications  Prior to Admission medications   Medication Sig Start Date End Date Taking? Authorizing Provider  ibuprofen (ADVIL,MOTRIN) 200 MG tablet Take 800 mg by mouth every 6 (six) hours as needed for headache or moderate pain.   Yes [provider]  cyclobenzaprine (FLEXERIL) 10 MG tablet Take 1 tablet (10 mg total) by mouth 3 (three) times daily as needed. Patient not taking: Reported on 05/10/2020 03/28/18   Triplett, Babette Relic, PA-C  predniSONE (DELTASONE) 10 MG tablet Take 6 tablets day one, 5 tablets day two, 4 tablets day three, 3 tablets day four, 2 tablets day five, then 1 tablet day six Patient not taking: Reported on 05/10/2020 03/28/18   Triplett, Tammy, PA-C  traMADol (ULTRAM) 50 MG tablet Take 1 tablet (50 mg total) by mouth every 6 (six) hours as needed. Patient not taking: Reported on 05/10/2020 03/28/18   Pauline Aus, PA-C
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
## 2020-05-20 ENCOUNTER — Inpatient Hospital Stay (HOSPITAL_COMMUNITY): Payer: Self-pay

## 2020-05-20 LAB — CBC
HCT: 29 % — ABNORMAL LOW (ref 39.0–52.0)
Hemoglobin: 9.4 g/dL — ABNORMAL LOW (ref 13.0–17.0)
MCH: 28.3 pg (ref 26.0–34.0)
MCHC: 32.4 g/dL (ref 30.0–36.0)
MCV: 87.3 fL (ref 80.0–100.0)
Platelets: 436 10*3/uL — ABNORMAL HIGH (ref 150–400)
RBC: 3.32 MIL/uL — ABNORMAL LOW (ref 4.22–5.81)
RDW: 13.3 % (ref 11.5–15.5)
WBC: 29 10*3/uL — ABNORMAL HIGH (ref 4.0–10.5)
nRBC: 0 % (ref 0.0–0.2)

## 2020-05-20 LAB — BASIC METABOLIC PANEL
Anion gap: 4 — ABNORMAL LOW (ref 5–15)
BUN: 66 mg/dL — ABNORMAL HIGH (ref 6–20)
CO2: 22 mmol/L (ref 22–32)
Calcium: 7.4 mg/dL — ABNORMAL LOW (ref 8.9–10.3)
Chloride: 103 mmol/L (ref 98–111)
Creatinine, Ser: 1.71 mg/dL — ABNORMAL HIGH (ref 0.61–1.24)
GFR calc Af Amer: >60 mL/min (ref >60)
GFR calc non Af Amer: 53 mL/min — ABNORMAL LOW (ref >60)
Glucose, Bld: 120 mg/dL — ABNORMAL HIGH (ref 70–99)
Potassium: 4.6 mmol/L (ref 3.5–5.1)
Sodium: 129 mmol/L — ABNORMAL LOW (ref 135–145)

## 2020-05-20 LAB — LACTATE DEHYDROGENASE: LDH: 130 U/L (ref 98–192)

## 2020-05-20 LAB — CK: Total CK: 14 U/L — ABNORMAL LOW (ref 49–397)

## 2020-05-20 IMAGING — DX DG CHEST 1V PORT
1 series · 1 of 1 positions shown · non-contrast
Comparison: [DATE]

CLINICAL DATA: Chest pain

EXAM:
PORTABLE CHEST 1 VIEW

[chest ap]
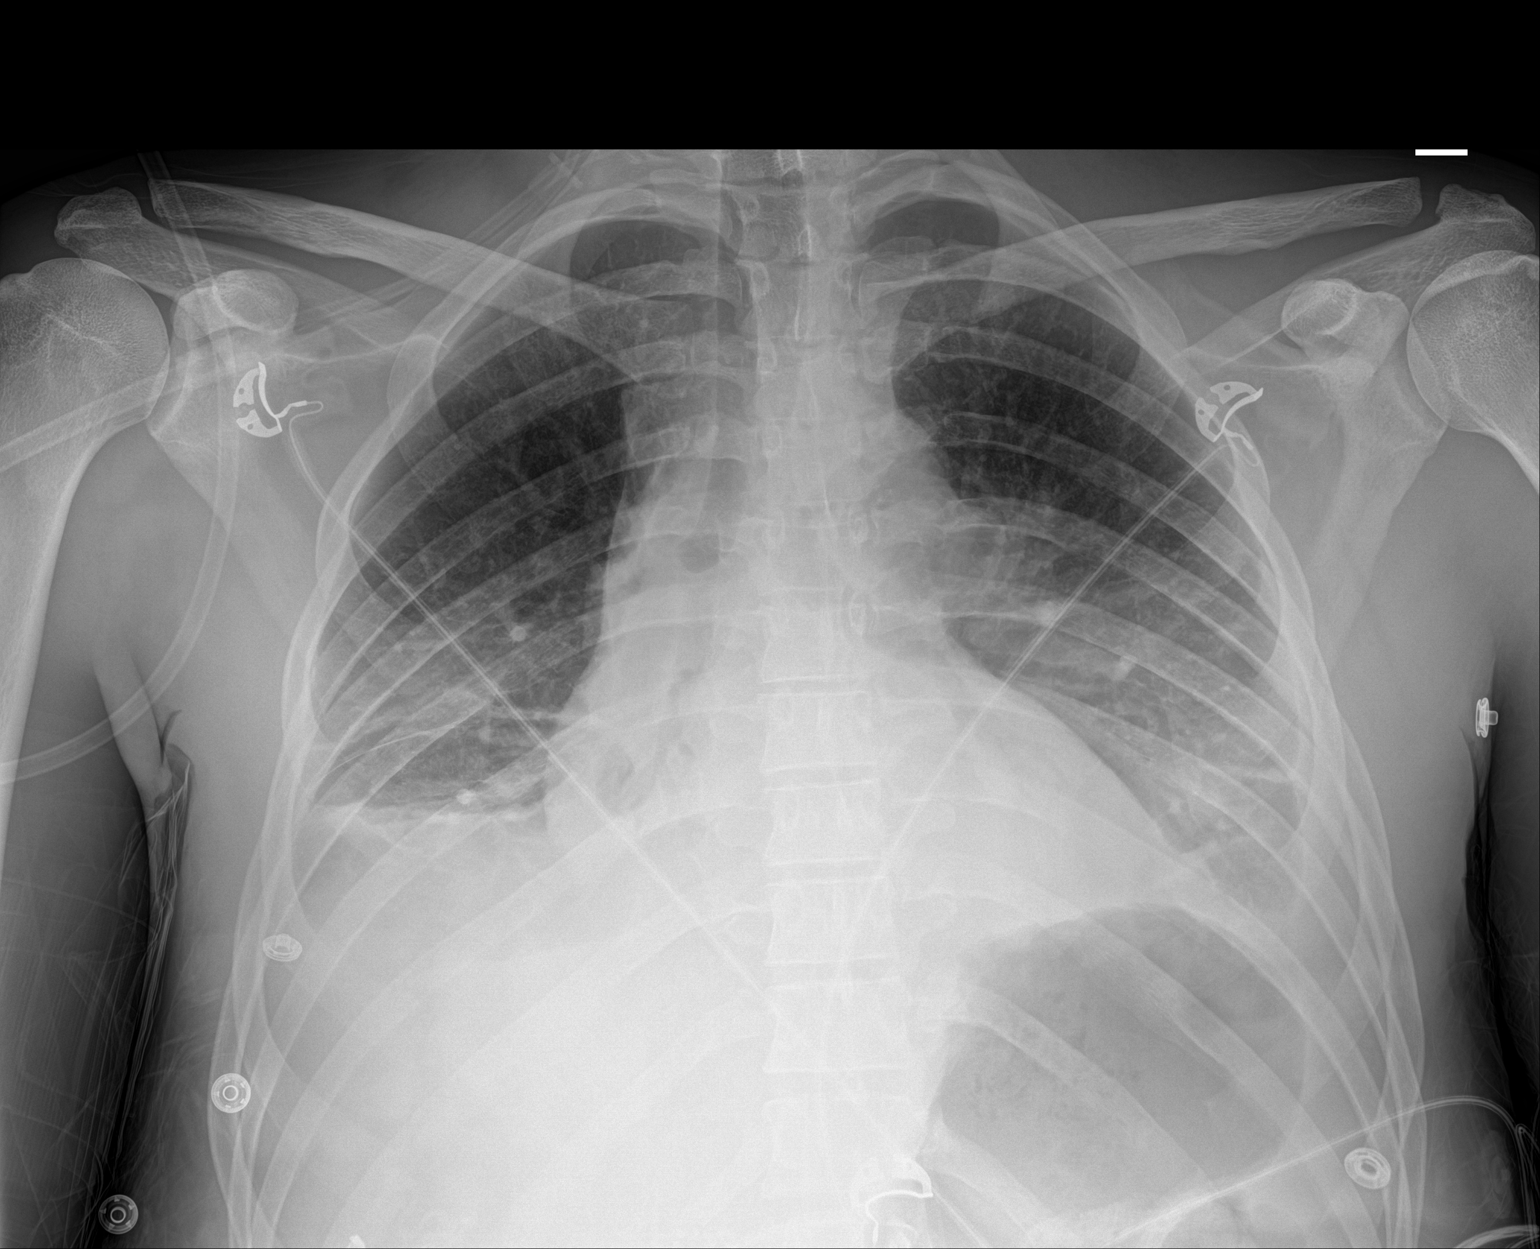

[1 of 1 positions shown; findings below may reference images not displayed]

FINDINGS: There are persistent small pleural effusions. There is atelectatic
change in the lung bases. There is also mild consolidation in the
medial bases with air bronchograms. Heart is upper normal in size
with pulmonary vascularity normal. No adenopathy. No pneumothorax.
No bone lesions.
IMPRESSION: Persistent bibasilar atelectasis in medial consolidation bilaterally
with small pleural effusions. No new opacity evident. Stable cardiac
silhouette.
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
## 2020-05-20 NOTE — Progress Notes (Signed)
[facility]  Date of Admission:  05/10/2020     Total days of antibiotics 11         ASSESSMENT:  Mr. Chatmon pleural fluid culture is without growth in <24 hours. WBC count is pending for today. This appears to be a disseminated MRSA infection and does not appear to have a clear indication for continuation of ciprofloxacin. Improvements seen in leukocytosis is likely related to increased dose of daptomycin. Will continue current dose of daptomycin at 10 mg/kg. Monitor pleural fluid culture although given daptomycin and ciprofloxacin would anticipate no growth given the broad spectrum of coverage. Renal function appears to be improving. Continue management other medical management per primary team. Currently planning for 14 days of therapy followed by oritivancin.   PLAN:  1. Continue daptomycin at 10 mg/kg.  2. Discontinue ciprofloxacin.  3. Monitor pleural fluid cultures. 4. Monitor CK levels while on daptomycin. 5. Pain management per primary team.   Principal Problem:   MRSA bacteremia Active Problems:   Renal failure   Polysubstance abuse (HCC)   Sepsis due to cellulitis (HCC)   Hyponatremia   Cellulitis of left lower extremity   IVDU (intravenous drug user)   . cloNIDine  0.1 mg Oral BID  . feeding supplement (NEPRO CARB STEADY)  237 mL Oral TID BM  . heparin  5,000 Units Subcutaneous Q8H  . lidocaine  1 patch Transdermal Q24H  . methocarbamol  500 mg Oral TID  . oxymetazoline  1 spray Each Nare BID  . senna-docusate  2 tablet Oral BID    SUBJECTIVE:  Afebrile overnight with no acute events. Pleural effusion drained through thoracentesis. Continues to have generalized pain and focused pain in his left shoulder and left calf. Breathing is about the same as yesterday. Had some discomfort overnight.    No Known Allergies   Review of Systems: Review of Systems  Constitutional: Negative for chills, fever and weight loss.  Respiratory:  Negative for cough, shortness of breath and wheezing.   Cardiovascular: Negative for chest pain and leg swelling.  Gastrointestinal: Negative for abdominal pain, constipation, diarrhea, nausea and vomiting.  Musculoskeletal:       Positive for generalized pain; left shoulder and left calf.   Skin: Negative for rash.      OBJECTIVE: Vitals:   05/20/20 0103 05/20/20 0456 05/20/20 0800 05/20/20 0900  BP:  122/88 130/90   Pulse: (!) 129 (!) 107 (!) 110   Resp: (!) 34 16 12 16   Temp:  98.6 F (37 C) 99.3 F (37.4 C)   TempSrc:  Oral Oral   SpO2: 90% 96% 97%   Weight:      Height:       Body mass index is 25.28 kg/m.  Physical Exam Constitutional:      General: He is not in acute distress.    Appearance: He is well-developed.     Comments: Lying in bed with head of bed elevated; lethargic and arousable.   Cardiovascular:     Rate and Rhythm: Normal rate and regular rhythm.     Heart sounds: Normal heart sounds.  Pulmonary:     Effort: Pulmonary effort is normal.     Breath sounds: Normal breath sounds.  Musculoskeletal:     Comments: There is edema of the left upper extremity and left lower leg.   Skin:    General: Skin is warm and dry.  Neurological:     Mental Status: He is oriented  to person, place, and time.  Psychiatric:        Mood and Affect: Mood normal.     Lab Results Lab Results  Component Value Date   WBC 43.0 (H) 05/19/2020   HGB 10.0 (L) 05/19/2020   HCT 30.9 (L) 05/19/2020   MCV 85.6 05/19/2020   PLT 435 (H) 05/19/2020    Lab Results  Component Value Date   CREATININE 1.68 (H) 05/19/2020   BUN 62 (H) 05/19/2020   NA 127 (L) 05/19/2020   K 4.9 05/19/2020   CL 96 (L) 05/19/2020   CO2 22 05/19/2020    Lab Results  Component Value Date   ALT 21 05/18/2020   AST 35 05/18/2020   ALKPHOS 138 (H) 05/18/2020   BILITOT 0.3 05/18/2020     Microbiology: Recent Results (from the past 240 hour(s))  Blood Culture (routine x 2)     Status:  Abnormal   Collection Time: 05/10/20  6:43 PM   Specimen: BLOOD RIGHT ARM  Result Value Ref Range Status   Specimen Description BLOOD RIGHT ARM  Final   Special Requests   Final    BOTTLES DRAWN AEROBIC AND ANAEROBIC Blood Culture adequate volume   Culture  Setup Time   Final    GRAM POSITIVE COCCI IN CLUSTERS IN BOTH AEROBIC AND ANAEROBIC BOTTLES CRITICAL RESULT CALLED TO, READ BACK BY AND VERIFIED WITH: Silvio PateHARND J [facility] [id number] AT 1531 BY CM Performed at [facility], [address] [place], [place] [address]    Culture METHICILLIN RESISTANT STAPHYLOCOCCUS AUREUS (A)  Final   Report Status 05/13/2020 FINAL  Final   Organism ID, Bacteria METHICILLIN RESISTANT STAPHYLOCOCCUS AUREUS  Final      Susceptibility   Methicillin resistant staphylococcus aureus - MIC*    CIPROFLOXACIN <=0.5 SENSITIVE Sensitive     ERYTHROMYCIN >=8 RESISTANT Resistant     GENTAMICIN <=0.5 SENSITIVE Sensitive     OXACILLIN >=4 RESISTANT Resistant     TETRACYCLINE <=1 SENSITIVE Sensitive     VANCOMYCIN <=0.5 SENSITIVE Sensitive     TRIMETH/SULFA <=10 SENSITIVE Sensitive     CLINDAMYCIN >=8 RESISTANT Resistant     RIFAMPIN <=0.5 SENSITIVE Sensitive     Inducible Clindamycin NEGATIVE Sensitive     * METHICILLIN RESISTANT STAPHYLOCOCCUS AUREUS  Urine culture     Status: None   Collection Time: 05/10/20  6:43 PM   Specimen: In/Out Cath Urine  Result Value Ref Range Status   Specimen Description   Final    IN/OUT CATH URINE Performed at [facility], [address] [place], [place] [address]    Special Requests   Final    NONE Performed at [facility], [address] [place], [place] [address]    Culture   Final    NO GROWTH Performed at [facility], [address] [place], [place] [address]    Report Status 05/12/2020 FINAL  Final  Blood Culture ID Panel (Reflexed)     Status: Abnormal   Collection Time: 05/10/20  6:43 PM  Result Value Ref Range  Status   Enterococcus faecalis NOT DETECTED NOT DETECTED Final   Enterococcus Faecium NOT DETECTED NOT DETECTED Final   Listeria monocytogenes NOT DETECTED NOT DETECTED Final   Staphylococcus species DETECTED (A) NOT DETECTED Final    Comment: CRITICAL RESULT CALLED TO, READ BACK BY AND VERIFIED WITH: PAHRMD J WAFFORD [id number] AT 1531 BY CM    Staphylococcus aureus (BCID) DETECTED (A) NOT  DETECTED Final    Comment: Methicillin (oxacillin)-resistant Staphylococcus aureus (MRSA). MRSA is predictably resistant to beta-lactam antibiotics (except ceftaroline). Preferred therapy is vancomycin unless clinically contraindicated. Patient requires contact precautions if  hospitalized. CRITICAL RESULT CALLED TO, READ BACK BY AND VERIFIED WITH: PHARMD J WAFFORD [id number] AT 1531 BY CM    Staphylococcus epidermidis NOT DETECTED NOT DETECTED Final   Staphylococcus lugdunensis NOT DETECTED NOT DETECTED Final   Streptococcus species NOT DETECTED NOT DETECTED Final   Streptococcus agalactiae NOT DETECTED NOT DETECTED Final   Streptococcus pneumoniae NOT DETECTED NOT DETECTED Final   Streptococcus pyogenes NOT DETECTED NOT DETECTED Final   A.calcoaceticus-baumannii NOT DETECTED NOT DETECTED Final   Bacteroides fragilis NOT DETECTED NOT DETECTED Final   Enterobacterales NOT DETECTED NOT DETECTED Final   Enterobacter cloacae complex NOT DETECTED NOT DETECTED Final   Escherichia coli NOT DETECTED NOT DETECTED Final   Klebsiella aerogenes NOT DETECTED NOT DETECTED Final   Klebsiella oxytoca NOT DETECTED NOT DETECTED Final   Klebsiella pneumoniae NOT DETECTED NOT DETECTED Final   Proteus species NOT DETECTED NOT DETECTED Final   Salmonella species NOT DETECTED NOT DETECTED Final   Serratia marcescens NOT DETECTED NOT DETECTED Final   Haemophilus influenzae NOT DETECTED NOT DETECTED Final   Neisseria meningitidis NOT DETECTED NOT DETECTED Final   Pseudomonas aeruginosa NOT DETECTED NOT DETECTED Final    Stenotrophomonas maltophilia NOT DETECTED NOT DETECTED Final   Candida albicans NOT DETECTED NOT DETECTED Final   Candida auris NOT DETECTED NOT DETECTED Final   Candida glabrata NOT DETECTED NOT DETECTED Final   Candida krusei NOT DETECTED NOT DETECTED Final   Candida parapsilosis NOT DETECTED NOT DETECTED Final   Candida tropicalis NOT DETECTED NOT DETECTED Final   Cryptococcus neoformans/gattii NOT DETECTED NOT DETECTED Final   Meth resistant mecA/C and MREJ DETECTED (A) NOT DETECTED Final    Comment: CRITICAL RESULT CALLED TO, READ BACK BY AND VERIFIED WITH: Dorann Lodge [facility] [id number] AT 1531 BY CM Performed at [facility], [address] [place], [place] [address]   Blood Culture (routine x 2)     Status: Abnormal   Collection Time: 05/10/20  6:48 PM   Specimen: BLOOD  Result Value Ref Range Status   Specimen Description   Final    BLOOD RIGHT ANTECUBITAL Performed at [facility], [address] [place], [place] [address]    Special Requests   Final    BOTTLES DRAWN AEROBIC AND ANAEROBIC Blood Culture adequate volume Performed at [facility], [address] [place], [place] [address]    Culture  Setup Time   Final    GRAM POSITIVE COCCI IN CLUSTERS IN BOTH AEROBIC AND ANAEROBIC BOTTLES CRITICAL VALUE NOTED.  VALUE IS CONSISTENT WITH PREVIOUSLY REPORTED AND CALLED VALUE.    Culture (A)  Final    STAPHYLOCOCCUS AUREUS SUSCEPTIBILITIES PERFORMED ON PREVIOUS CULTURE WITHIN THE LAST 5 DAYS. Performed at [facility], [address] [place], [place] [address]    Report Status 05/13/2020 FINAL  Final  SARS Coronavirus 2 by RT PCR (hospital order, performed in [facility] hospital lab) Nasopharyngeal Nasopharyngeal Swab     Status: None   Collection Time: 05/10/20  7:25 PM   Specimen: Nasopharyngeal Swab  Result Value Ref Range Status   SARS Coronavirus 2 NEGATIVE NEGATIVE Final    Comment: (NOTE) SARS-CoV-2 target nucleic  acids are NOT DETECTED.  The SARS-CoV-2 RNA is generally detectable in upper and lower respiratory specimens  during the acute phase of infection. The lowest concentration of SARS-CoV-2 viral copies this assay can detect is 250 copies / mL. A negative result does not preclude SARS-CoV-2 infection and should not be used as the sole basis for treatment or other patient management decisions.  A negative result may occur with improper specimen collection / handling, submission of specimen other than nasopharyngeal swab, presence of viral mutation(s) within the areas targeted by this assay, and inadequate number of viral copies (<250 copies / mL). A negative result must be combined with clinical observations, patient history, and epidemiological information.  Fact Sheet for Patients:   BoilerBrush.com.cy  Fact Sheet for Healthcare Providers: https://pope.com/  This test is not yet approved or  cleared by the [place] FDA and has been authorized for detection and/or diagnosis of SARS-CoV-2 by FDA under an Emergency Use Authorization (EUA).  This EUA will remain in effect (meaning this test can be used) for the duration of the COVID-19 declaration under Section 564(b)(1) of the Act, 21 U.S.C. section 360bbb-3(b)(1), unless the authorization is terminated or revoked sooner.  Performed at [facility], [address] [place], [place] [address]   Culture, blood (Routine X 2) w Reflex to ID Panel     Status: None   Collection Time: 05/12/20  8:55 AM   Specimen: BLOOD LEFT ARM  Result Value Ref Range Status   Specimen Description BLOOD LEFT ARM  Final   Special Requests   Final    BOTTLES DRAWN AEROBIC ONLY Blood Culture results may not be optimal due to an inadequate volume of blood received in culture bottles   Culture   Final    NO GROWTH 5 DAYS Performed at [facility], [address] [place], [place]  [address]    Report Status 05/17/2020 FINAL  Final  Culture, blood (Routine X 2) w Reflex to ID Panel     Status: None   Collection Time: 05/12/20  8:55 AM   Specimen: BLOOD LEFT ARM  Result Value Ref Range Status   Specimen Description BLOOD LEFT ARM  Final   Special Requests   Final    BOTTLES DRAWN AEROBIC ONLY Blood Culture results may not be optimal due to an inadequate volume of blood received in culture bottles   Culture   Final    NO GROWTH 5 DAYS Performed at [facility], [address], [place], [place] [address]    Report Status 05/17/2020 FINAL  Final  Body fluid culture (includes gram stain)     Status: None (Preliminary result)   Collection Time: 05/19/20  2:16 PM   Specimen: Pleural Fluid  Result Value Ref Range Status   Specimen Description FLUID PLEURAL RIGHT  Final   Special Requests NONE  Final   Gram Stain NO WBC SEEN NO ORGANISMS SEEN   Final   Culture   Final    NO GROWTH < 24 HOURS Performed at [facility], [address] [place], [place] [address]    Report Status PENDING  Incomplete     Marcos Eke, NP [facility]  05/20/2020  10:44 AM

## 2020-05-20 NOTE — Plan of Care (Signed)
  Problem: Clinical Measurements: Goal: Respiratory complications will improve Outcome: Progressing   

## 2020-05-20 NOTE — Progress Notes (Signed)
   05/20/20 2015  Assess: MEWS Score  Temp 99.1 F (37.3 C)  BP (!) 147/94  ECG Heart Rate (!) 116  Resp 19  Level of Consciousness Alert  O2 Device Nasal Cannula  Patient Activity (if Appropriate) In bed  O2 Flow Rate (L/min) 3 L/min  Assess: MEWS Score  MEWS Temp 0  MEWS Systolic 0  MEWS Pulse 2  MEWS RR 0  MEWS LOC 0  MEWS Score 2  MEWS Score Color Yellow  Assess: if the MEWS score is Yellow or Red  Were vital signs taken at a resting state? Yes  Focused Assessment No change from prior assessment  Early Detection of Sepsis Score *See Row Information* Low  MEWS guidelines implemented *See Row Information* No, previously yellow, continue vital signs every 4 hours  Treat  MEWS Interventions Escalated (See documentation below)  Pain Scale 0-10  Pain Score 9  Pain Type Acute pain  Pain Location Generalized  Pain Descriptors / Indicators Sore  Pain Frequency Constant  Pain Onset On-going  Pain Intervention(s) Medication (See eMAR)  Escalate  MEWS: Escalate Yellow: discuss with charge nurse/RN and consider discussing with provider and RRT  Notify: Charge Nurse/RN  Name of Charge Nurse/RN Notified Jequetta,RN  Date Charge Nurse/RN Notified 05/20/20  Time Charge Nurse/RN Notified 2034

## 2020-05-20 NOTE — Progress Notes (Addendum)
Pharmacy Antibiotic Note  [name] is a 29 y.o. male admitted on 05/10/2020 with  MRSA bacteremia (TEE neg), R shoulder septic arthritis, L knee diffuse cellulitis/abscess.  Pharmacy has been consulted for Daptomycin dosing.  ID: MRSA bacteremia (TEE neg), L knee diffuse cellulitis/abscess - 9/25: MRI significantly worse celluliits/myofasciitis - increased dapto. - TTE 9/20with thin mobile density below the TV annulus. Unclear whether  this is a vegetation or eustacion valve.  - TEE 9/22 no vegetations. - Surg consult r/o nec fasc -- Hep C positive, Hep A Ab reactive, hep B negative  Afebrile, WBC 19.8>33.8>54.3>54.1>43>29  Vanc x 1 on 9/18 CTX x 1 on 9/18 Zyvox  9/18>> 9/21 Dapto 9/21 >> (increased 9/25) >> Orita x1 >> (9/27) Cipro 9/26 >>  9/21 CK 72 9/24 CK 24 9/25 CK 18 9/28: CK 14  9/18 urine: negative 9/18 COVID: negative 9/18 BCx: 1/4 bottles w/ Staph aureus 9/18: BCID Staph, mecA detected 9/20 repeat BCx: neg  Plan: - Dapto per Rx, Increased to 800 mg (10 mg/kg) IV q24h Day #8. - ID Recommend total 14d Daptomycin and currently he is willing to stay.  Would be okay with as little 3 days for 10 day course followed by a dose of oritivancin in either situation. - Cipro for gram neg coverage and PSA coverage with IVDA. Likely stop today per ID note. - f/u CK today, not yet drawn.    Height: 5\' 10"  (177.8 cm) Weight: 79.9 kg (176 lb 3.2 oz) IBW/kg (Calculated) : 73  Temp (24hrs), Avg:98.5 F (36.9 C), Min:97.8 F (36.6 C), Max:99.3 F (37.4 C)  Recent Labs  Lab 05/16/20 0905 05/16/20 0905 05/16/20 1709 05/17/20 0600 05/17/20 1735 05/18/20 1159 05/19/20 0447  WBC 54.3*  --   --  56.9* 54.1* 61.1* 43.0*  CREATININE 2.55*   < > 2.52* 2.36* 2.08* 1.79* 1.68*   < > = values in this interval not displayed.    Estimated Creatinine Clearance: 67 mL/min (A) (by C-G formula based on SCr of 1.68 mg/dL (H)).    No Known Allergies  [name] S. 05/21/20,  PharmD, BCPS Clinical Staff Pharmacist [email or web address]  Merilynn Finland 05/20/2020 9:13 AM
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
## 2020-05-21 ENCOUNTER — Encounter (HOSPITAL_COMMUNITY): Payer: Self-pay

## 2020-05-21 ENCOUNTER — Inpatient Hospital Stay (HOSPITAL_COMMUNITY): Payer: Self-pay

## 2020-05-21 DIAGNOSIS — M7989 Other specified soft tissue disorders: Secondary | ICD-10-CM

## 2020-05-21 DIAGNOSIS — M79609 Pain in unspecified limb: Secondary | ICD-10-CM

## 2020-05-21 DIAGNOSIS — R609 Edema, unspecified: Secondary | ICD-10-CM

## 2020-05-21 LAB — CBC
HCT: 35 % — ABNORMAL LOW (ref 39.0–52.0)
Hemoglobin: 11.3 g/dL — ABNORMAL LOW (ref 13.0–17.0)
MCH: 28 pg (ref 26.0–34.0)
MCHC: 32.3 g/dL (ref 30.0–36.0)
MCV: 86.6 fL (ref 80.0–100.0)
Platelets: 249 10*3/uL (ref 150–400)
RBC: 4.04 MIL/uL — ABNORMAL LOW (ref 4.22–5.81)
RDW: 13 % (ref 11.5–15.5)
WBC: 32.6 10*3/uL — ABNORMAL HIGH (ref 4.0–10.5)
nRBC: 0 % (ref 0.0–0.2)

## 2020-05-21 LAB — COMPREHENSIVE METABOLIC PANEL
ALT: 17 U/L (ref 0–44)
AST: 29 U/L (ref 15–41)
Albumin: 1.2 g/dL — ABNORMAL LOW (ref 3.5–5.0)
Alkaline Phosphatase: 87 U/L (ref 38–126)
Anion gap: 11 (ref 5–15)
BUN: 62 mg/dL — ABNORMAL HIGH (ref 6–20)
CO2: 22 mmol/L (ref 22–32)
Calcium: 7.5 mg/dL — ABNORMAL LOW (ref 8.9–10.3)
Chloride: 94 mmol/L — ABNORMAL LOW (ref 98–111)
Creatinine, Ser: 1.69 mg/dL — ABNORMAL HIGH (ref 0.61–1.24)
GFR calc Af Amer: >60 mL/min (ref >60)
GFR calc non Af Amer: 54 mL/min — ABNORMAL LOW (ref >60)
Glucose, Bld: 70 mg/dL (ref 70–99)
Potassium: 5.3 mmol/L — ABNORMAL HIGH (ref 3.5–5.1)
Sodium: 127 mmol/L — ABNORMAL LOW (ref 135–145)
Total Bilirubin: 0.5 mg/dL (ref 0.3–1.2)
Total Protein: 4.8 g/dL — ABNORMAL LOW (ref 6.5–8.1)

## 2020-05-21 LAB — PATHOLOGIST SMEAR REVIEW

## 2020-05-21 MED ORDER — ADULT MULTIVITAMIN W/MINERALS CH
1.0000 | ORAL_TABLET | Freq: Every day | ORAL | Status: DC
  Administered 2020-05-21 – 2020-07-02 (×43): 1 via ORAL
  Filled 2020-05-21 (×44): qty 1

## 2020-05-21 MED ORDER — MELATONIN 3 MG PO TABS
3.0000 mg | ORAL_TABLET | Freq: Every day | ORAL | Status: DC
  Administered 2020-05-21 – 2020-07-01 (×42): 3 mg via ORAL
  Filled 2020-05-21 (×42): qty 1

## 2020-05-21 MED ORDER — POLYETHYLENE GLYCOL 3350 17 G PO PACK
17.0000 g | PACK | Freq: Every day | ORAL | Status: DC
  Administered 2020-05-21 – 2020-05-24 (×4): 17 g via ORAL
  Filled 2020-05-21 (×4): qty 1

## 2020-05-21 MED ORDER — BENEPROTEIN PO POWD
1.0000 | Freq: Three times a day (TID) | ORAL | Status: DC
  Administered 2020-05-22 – 2020-05-27 (×11): 6 g via ORAL
  Filled 2020-05-21: qty 227

## 2020-05-21 MED ORDER — BOOST / RESOURCE BREEZE PO LIQD CUSTOM
1.0000 | Freq: Three times a day (TID) | ORAL | Status: DC
  Administered 2020-05-21 – 2020-06-16 (×54): 1 via ORAL

## 2020-05-21 NOTE — Progress Notes (Signed)
[facility]  Date of Admission:  05/10/2020     Total days of antibiotics 12         ASSESSMENT:  Mr. Graumann has new onset pain and edema of the right upper arm from the shoulder to mid-forearm. There is no significant redness but there is generalized warmth to the upper arm concerning for possible disseminated MRSA infection although cannot rule out possible DVT. Upper extremity doppler is pending. Otherwise pleural fluid culture are without growth. If dopplers negative may need additional upper extremity imaging to rule out infection. Most recent CK level of 14. Continue current dose of daptomycin.   PLAN:  1. Continue daptomycin. 2. Await results of upper extremity doppler 3. Monitor CK levels while on daptomycin.   Principal Problem:   MRSA bacteremia Active Problems:   Renal failure   Polysubstance abuse (HCC)   Sepsis due to cellulitis (HCC)   Hyponatremia   Cellulitis of left lower extremity   IVDU (intravenous drug user)   . cloNIDine  0.1 mg Oral BID  . feeding supplement (NEPRO CARB STEADY)  237 mL Oral TID BM  . heparin  5,000 Units Subcutaneous Q8H  . lidocaine  1 patch Transdermal Q24H  . methocarbamol  500 mg Oral TID  . oxymetazoline  1 spray Each Nare BID  . senna-docusate  2 tablet Oral BID    SUBJECTIVE:  Afebrile overnight. Had increasing right arm pain per nursing notes. Pleural fluid cultures remain without growth. WBC count slightly increased.   No Known Allergies   Review of Systems: Review of Systems  Constitutional: Negative for chills, fever and weight loss.  Respiratory: Negative for cough, shortness of breath and wheezing.   Cardiovascular: Negative for chest pain and leg swelling.  Gastrointestinal: Negative for abdominal pain, constipation, diarrhea, nausea and vomiting.  Musculoskeletal:       Positive for right upper extremity pain and swelling.  Skin: Negative for rash.      OBJECTIVE: Vitals:     05/20/20 2015 05/21/20 0010 05/21/20 0440 05/21/20 0747  BP: (!) 147/94 (!) 137/95 (!) 163/82 (!) 146/80  Pulse:  (!) 121 (!) 117 (!) 106  Resp: 19 16 19 18   Temp: 99.1 F (37.3 C) 98.7 F (37.1 C) 98.7 F (37.1 C) 98.6 F (37 C)  TempSrc: Oral Oral Oral Oral  SpO2:  94% 91% 94%  Weight:   79.7 kg   Height:       Body mass index is 25.21 kg/m.  Physical Exam Constitutional:      General: He is not in acute distress.    Appearance: He is well-developed.  Cardiovascular:     Rate and Rhythm: Normal rate and regular rhythm.     Heart sounds: Normal heart sounds.  Pulmonary:     Effort: Pulmonary effort is normal.     Breath sounds: Normal breath sounds.  Musculoskeletal:     Comments: Obvious edema with no significant redness or deformity. There is warmth of the upper arm. Distal pulses and sensation are intact and appropriate.   Skin:    General: Skin is warm and dry.  Neurological:     Mental Status: He is alert and oriented to person, place, and time.  Psychiatric:        Behavior: Behavior normal.        Thought Content: Thought content normal.        Judgment: Judgment normal.     Lab Results Lab Results  Component Value Date   WBC 29.0 (H) 05/20/2020   HGB 9.4 (L) 05/20/2020   HCT 29.0 (L) 05/20/2020   MCV 87.3 05/20/2020   PLT 436 (H) 05/20/2020    Lab Results  Component Value Date   CREATININE 1.71 (H) 05/20/2020   BUN 66 (H) 05/20/2020   NA 129 (L) 05/20/2020   K 4.6 05/20/2020   CL 103 05/20/2020   CO2 22 05/20/2020    Lab Results  Component Value Date   ALT 21 05/18/2020   AST 35 05/18/2020   ALKPHOS 138 (H) 05/18/2020   BILITOT 0.3 05/18/2020     Microbiology: Recent Results (from the past 240 hour(s))  Culture, blood (Routine X 2) w Reflex to ID Panel     Status: None   Collection Time: 05/12/20  8:55 AM   Specimen: BLOOD LEFT ARM  Result Value Ref Range Status   Specimen Description BLOOD LEFT ARM  Final   Special Requests   Final     BOTTLES DRAWN AEROBIC ONLY Blood Culture results may not be optimal due to an inadequate volume of blood received in culture bottles   Culture   Final    NO GROWTH 5 DAYS Performed at [facility], [address] [place], [place] [address]    Report Status 05/17/2020 FINAL  Final  Culture, blood (Routine X 2) w Reflex to ID Panel     Status: None   Collection Time: 05/12/20  8:55 AM   Specimen: BLOOD LEFT ARM  Result Value Ref Range Status   Specimen Description BLOOD LEFT ARM  Final   Special Requests   Final    BOTTLES DRAWN AEROBIC ONLY Blood Culture results may not be optimal due to an inadequate volume of blood received in culture bottles   Culture   Final    NO GROWTH 5 DAYS Performed at [facility], [address], [place], [place] [address]    Report Status 05/17/2020 FINAL  Final  Body fluid culture (includes gram stain)     Status: None (Preliminary result)   Collection Time: 05/19/20  2:16 PM   Specimen: Pleural Fluid  Result Value Ref Range Status   Specimen Description FLUID PLEURAL RIGHT  Final   Special Requests NONE  Final   Gram Stain NO WBC SEEN NO ORGANISMS SEEN   Final   Culture   Final    NO GROWTH 2 DAYS Performed at [facility], [address], [place], [place] [address]    Report Status PENDING  Incomplete     Marcos Eke, NP [facility]  05/21/2020  10:27 AM
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
## 2020-05-21 NOTE — Progress Notes (Signed)
Pt. C/O increased R arm swelling and pain. On call for [facility] paged to make aware.

## 2020-05-21 NOTE — Progress Notes (Signed)
CSW intern attempted to engage with patient and discuss resources regarding substance use, homelessness and mental health. Patient noted that he was in pain and requested the doctor. I told him that I could return later and he agreed.
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
## 2020-05-22 ENCOUNTER — Inpatient Hospital Stay (HOSPITAL_COMMUNITY): Payer: Self-pay

## 2020-05-22 ENCOUNTER — Encounter (HOSPITAL_COMMUNITY): Payer: Self-pay

## 2020-05-22 LAB — BASIC METABOLIC PANEL
Anion gap: 10 (ref 5–15)
BUN: 62 mg/dL — ABNORMAL HIGH (ref 6–20)
CO2: 22 mmol/L (ref 22–32)
Calcium: 7.5 mg/dL — ABNORMAL LOW (ref 8.9–10.3)
Chloride: 95 mmol/L — ABNORMAL LOW (ref 98–111)
Creatinine, Ser: 1.48 mg/dL — ABNORMAL HIGH (ref 0.61–1.24)
GFR calc Af Amer: >60 mL/min (ref >60)
GFR calc non Af Amer: >60 mL/min (ref >60)
Glucose, Bld: 109 mg/dL — ABNORMAL HIGH (ref 70–99)
Potassium: 4.8 mmol/L (ref 3.5–5.1)
Sodium: 127 mmol/L — ABNORMAL LOW (ref 135–145)

## 2020-05-22 LAB — CBC WITH DIFFERENTIAL/PLATELET
Abs Immature Granulocytes: 1.55 10*3/uL — ABNORMAL HIGH (ref 0.00–0.07)
Basophils Absolute: 0.1 10*3/uL (ref 0.0–0.1)
Basophils Relative: 0 %
Eosinophils Absolute: 0.3 10*3/uL (ref 0.0–0.5)
Eosinophils Relative: 1 %
HCT: 29.2 % — ABNORMAL LOW (ref 39.0–52.0)
Hemoglobin: 9.5 g/dL — ABNORMAL LOW (ref 13.0–17.0)
Immature Granulocytes: 5 %
Lymphocytes Relative: 5 %
Lymphs Abs: 1.6 10*3/uL (ref 0.7–4.0)
MCH: 27.5 pg (ref 26.0–34.0)
MCHC: 32.5 g/dL (ref 30.0–36.0)
MCV: 84.4 fL (ref 80.0–100.0)
Monocytes Absolute: 2.2 10*3/uL — ABNORMAL HIGH (ref 0.1–1.0)
Monocytes Relative: 7 %
Neutro Abs: 25.4 10*3/uL — ABNORMAL HIGH (ref 1.7–7.7)
Neutrophils Relative %: 82 %
Platelets: 437 10*3/uL — ABNORMAL HIGH (ref 150–400)
RBC: 3.46 MIL/uL — ABNORMAL LOW (ref 4.22–5.81)
RDW: 13 % (ref 11.5–15.5)
WBC: 31.2 10*3/uL — ABNORMAL HIGH (ref 4.0–10.5)
nRBC: 0 % (ref 0.0–0.2)

## 2020-05-22 LAB — MAGNESIUM: Magnesium: 1.7 mg/dL (ref 1.7–2.4)

## 2020-05-22 MED ORDER — HYDROMORPHONE HCL 1 MG/ML IJ SOLN
1.0000 mg | INTRAMUSCULAR | Status: DC | PRN
  Administered 2020-05-22 – 2020-05-27 (×47): 1 mg via INTRAVENOUS
  Filled 2020-05-22 (×48): qty 1

## 2020-05-22 MED ORDER — SODIUM CHLORIDE 0.9 % IV SOLN
INTRAVENOUS | Status: DC | PRN
  Administered 2020-05-22 – 2020-06-06 (×6): 250 mL via INTRAVENOUS

## 2020-05-22 MED ORDER — INFLUENZA VAC SPLIT QUAD 0.5 ML IM SUSY
0.5000 mL | PREFILLED_SYRINGE | INTRAMUSCULAR | Status: AC
  Administered 2020-05-25: 0.5 mL via INTRAMUSCULAR
  Filled 2020-05-22: qty 0.5

## 2020-05-22 NOTE — Progress Notes (Signed)
[facility]  Date of Admission:  05/10/2020     Total days of antibiotics 13         ASSESSMENT:  [name] doppler in right upper extremity was negative for blood clot. Now requiring assistance with eating and drinking secondary to pain in his bilateral upper extremities. Discussed that we would recommend CT/MRI of right shoulder/upper extremity to rule out infection. This is likely related to disseminated MRSA infection. There has been no clear gram negative bacteria growing and his WBC count continues to improve and has remained afebrile. No indication for addition of ciprofloxacin. Recommend continuing current dose of daptomycin at 10 mg/kg. Renal function is continuing to improve. Pain management and supportive care per primary team.   PLAN:  1. Continue current dose of Daptomycin. 2. Monitor CK levels while on daptomycin 3. Recommend MRI/CT imaging of right upper extremity. 4. Pain management and supportive care per primary team.   Principal Problem:   MRSA bacteremia Active Problems:   Renal failure   Polysubstance abuse (HCC)   Sepsis due to cellulitis (HCC)   Hyponatremia   Cellulitis of left lower extremity   IVDU (intravenous drug user)   . cloNIDine  0.1 mg Oral BID  . feeding supplement  1 Container Oral TID BM  . heparin  5,000 Units Subcutaneous Q8H  . lidocaine  1 patch Transdermal Q24H  . melatonin  3 mg Oral QHS  . methocarbamol  500 mg Oral TID  . multivitamin with minerals  1 tablet Oral Daily  . oxymetazoline  1 spray Each Nare BID  . polyethylene glycol  17 g Oral Daily  . protein supplement  1 Scoop Oral TID WC  . senna-docusate  2 tablet Oral BID    SUBJECTIVE:  Afebrile overnight. Continues to have multiple areas of pain in bilateral upper extremities and left lower extremities. Thirsty and has been asking for a couple of hours for assistance with food and drink. [name] that he feels like he is getting worse.    No Known Allergies   Review of Systems: Review of Systems  Constitutional: Positive for fever and malaise/fatigue. Negative for chills and weight loss.       Positive for generalized pain  Respiratory: Negative for cough, shortness of breath and wheezing.   Cardiovascular: Negative for chest pain and leg swelling.  Gastrointestinal: Negative for abdominal pain, constipation, diarrhea, nausea and vomiting.  Skin: Negative for rash.      OBJECTIVE: Vitals:   05/21/20 2026 05/22/20 0000 05/22/20 0500 05/22/20 0700  BP: (!) 151/78 129/75 131/78 140/79  Pulse: (!) 125 (!) 118 (!) 109 (!) 109  Resp: 18 15 13 20   Temp: 98.4 F (36.9 C) 98.7 F (37.1 C) 98.7 F (37.1 C)   TempSrc: Oral Oral Oral   SpO2: 94% 96% 99% 97%  Weight:   79.5 kg   Height:       Body mass index is 25.15 kg/m.  Physical Exam Constitutional:      General: He is not in acute distress.    Appearance: He is well-developed. He is ill-appearing.     Comments: Lying in bed with head of bed elevated; tearful   Cardiovascular:     Rate and Rhythm: Regular rhythm. Tachycardia present.     Heart sounds: Normal heart sounds.  Pulmonary:     Effort: Pulmonary effort is normal.     Breath sounds: Normal breath sounds.  Musculoskeletal:  Comments: There is warmth and edema of the right upper extremity similar to yesterday. IV site appears clean and dry without evidence of infection.   Skin:    General: Skin is warm and dry.  Neurological:     Mental Status: He is alert and oriented to person, place, and time.     Lab Results Lab Results  Component Value Date   WBC 31.2 (H) 05/22/2020   HGB 9.5 (L) 05/22/2020   HCT 29.2 (L) 05/22/2020   MCV 84.4 05/22/2020   PLT 437 (H) 05/22/2020    Lab Results  Component Value Date   CREATININE 1.48 (H) 05/22/2020   BUN 62 (H) 05/22/2020   NA 127 (L) 05/22/2020   K 4.8 05/22/2020   CL 95 (L) 05/22/2020   CO2 22 05/22/2020    Lab Results  Component Value  Date   ALT 17 05/21/2020   AST 29 05/21/2020   ALKPHOS 87 05/21/2020   BILITOT 0.5 05/21/2020     Microbiology: Recent Results (from the past 240 hour(s))  Body fluid culture (includes gram stain)     Status: None (Preliminary result)   Collection Time: 05/19/20  2:16 PM   Specimen: Pleural Fluid  Result Value Ref Range Status   Specimen Description FLUID PLEURAL RIGHT  Final   Special Requests NONE  Final   Gram Stain NO WBC SEEN NO ORGANISMS SEEN   Final   Culture   Final    NO GROWTH 3 DAYS Performed at [facility], [address], [place], [place] [address]    Report Status PENDING  Incomplete     Marcos Eke, NP [facility]  05/22/2020  10:22 AM
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
## 2020-05-22 NOTE — Progress Notes (Signed)
Attempted to bring patient down for MRI. Per transport pt is refusing exams. RN aware.
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
## 2020-05-23 LAB — BASIC METABOLIC PANEL
Anion gap: 7 (ref 5–15)
BUN: 55 mg/dL — ABNORMAL HIGH (ref 6–20)
CO2: 23 mmol/L (ref 22–32)
Calcium: 7.2 mg/dL — ABNORMAL LOW (ref 8.9–10.3)
Chloride: 97 mmol/L — ABNORMAL LOW (ref 98–111)
Creatinine, Ser: 1.36 mg/dL — ABNORMAL HIGH (ref 0.61–1.24)
GFR calc Af Amer: >60 mL/min (ref >60)
GFR calc non Af Amer: >60 mL/min (ref >60)
Glucose, Bld: 140 mg/dL — ABNORMAL HIGH (ref 70–99)
Potassium: 5.1 mmol/L (ref 3.5–5.1)
Sodium: 127 mmol/L — ABNORMAL LOW (ref 135–145)

## 2020-05-23 LAB — CBC WITH DIFFERENTIAL/PLATELET
Abs Immature Granulocytes: 0 10*3/uL (ref 0.00–0.07)
Basophils Absolute: 0 10*3/uL (ref 0.0–0.1)
Basophils Relative: 0 %
Eosinophils Absolute: 0.5 10*3/uL (ref 0.0–0.5)
Eosinophils Relative: 2 %
HCT: 27.7 % — ABNORMAL LOW (ref 39.0–52.0)
Hemoglobin: 9 g/dL — ABNORMAL LOW (ref 13.0–17.0)
Lymphocytes Relative: 2 %
Lymphs Abs: 0.5 10*3/uL — ABNORMAL LOW (ref 0.7–4.0)
MCH: 27.4 pg (ref 26.0–34.0)
MCHC: 32.5 g/dL (ref 30.0–36.0)
MCV: 84.2 fL (ref 80.0–100.0)
Monocytes Absolute: 1.5 10*3/uL — ABNORMAL HIGH (ref 0.1–1.0)
Monocytes Relative: 6 %
Neutro Abs: 22.3 10*3/uL — ABNORMAL HIGH (ref 1.7–7.7)
Neutrophils Relative %: 90 %
Platelets: 428 10*3/uL — ABNORMAL HIGH (ref 150–400)
RBC: 3.29 MIL/uL — ABNORMAL LOW (ref 4.22–5.81)
RDW: 12.9 % (ref 11.5–15.5)
WBC: 24.8 10*3/uL — ABNORMAL HIGH (ref 4.0–10.5)
nRBC: 0 % (ref 0.0–0.2)
nRBC: 0 /100 WBC

## 2020-05-23 LAB — BODY FLUID CULTURE
Culture: NO GROWTH
Gram Stain: NONE SEEN

## 2020-05-23 LAB — PHOSPHORUS: Phosphorus: 4.7 mg/dL — ABNORMAL HIGH (ref 2.5–4.6)

## 2020-05-23 MED ORDER — HYDROMORPHONE HCL 1 MG/ML IJ SOLN
1.0000 mg | INTRAMUSCULAR | Status: AC
  Administered 2020-05-24: 1 mg via INTRAVENOUS
  Filled 2020-05-23: qty 1

## 2020-05-23 NOTE — Progress Notes (Signed)
PT Cancellation Note  Patient Details Name: [name] MRN: [id number] DOB: 04/18/1991   Cancelled Treatment:     pt asleep upon arrival, attempted to arouse, pt rolled over and fell back asleep, will attempt later time permitting  Ginette Otto, DPT Acute Rehabilitation Services [phone]   Lucretia Field 05/23/2020, 12:12 PM

## 2020-05-23 NOTE — Progress Notes (Signed)
    [facility]   Reason for visit: Follow up on MRSA bacteremia  Interval History: refused MRI and labs.   Continues on datpomycin  Physical Exam: Constitutional:  Vitals:   05/23/20 0505 05/23/20 0728  BP:    Pulse:    Resp:    Temp: 97.8 F (36.6 C) 98 F (36.7 C)  SpO2:     patient appears in NAD  Impression: MRSA bacteremia, left leg cellulitis with myofasciitis.  Swollen right arm with refusal for radiographic investigation.   Plan: 1.  Continue with daptomycin for a prolonged course.  Not a candidate for home IV therapy.    Dr. Drue Second available over the weekend if needed, otherwise I will follow up next week

## 2020-05-23 NOTE — Progress Notes (Signed)
Called the floor to ask about patient's willingness to have MRI scans today.  Patient refused scans stated he could not move.

## 2020-05-23 NOTE — Progress Notes (Signed)
Pharmacy Antibiotic Note  [name] is a 29 y.o. male admitted on 05/10/2020 with  MRSA bacteremia (TEE neg), R shoulder septic arthritis, L knee diffuse cellulitis/abscess.  Pharmacy has been consulted for Daptomycin dosing.  ID: MRSA bacteremia (TEE neg), R shoulder septic arthritis, L knee diffuse cellulitis/abscess - 9/25: MRI significantly worse celluliits/myofasciitis - increased dapto. - TTE 9/20with thin mobile density below the TV annulus. Unclear whether  this is a vegetation or eustacion valve.  - TEE 9/22 no vegetations. - Surg consult r/o nec fasc -- Hep C positive, Hep A Ab reactive, hep B negative  Afebrile, WBC 19.8>33.8>54.3>54.1>43>29>31.2  Vanc x 1 on 9/18 CTX x 1 on 9/18 Zyvox  9/18>> 9/21 Dapto 9/21 >> (increased 9/25) >> Orita x1 >> (9/27) Cipro 9/26 >>9/28  9/21 CK 72 9/24 CK 24 9/25 CK 18 9/28:CK 14  9/18 urine: negative 9/18 COVID: negative 9/18 BCx: 1/4 bottles w/ Staph aureus 9/18: BCID Staph, mecA detected 9/20 repeat BCx: neg   Plan:  - Dapto per Rx, Increased to 800 mg (10 mg/kg) IV q24h Day #10/14. - ID Recommend total 14d Daptomycin and currently he is willing to stay.  Would be okay with as little 3 days for 10 day course followed by a dose of oritivancin in either situation. - CK weekly on Tuesdays - MRI shoulder/arm--pt refused - Refused labs 10/1   Height: 5\' 10"  (177.8 cm) Weight: 81.4 kg (179 lb 7.3 oz) IBW/kg (Calculated) : 73  Temp (24hrs), Avg:98.6 F (37 C), Min:97.8 F (36.6 C), Max:99.3 F (37.4 C)  Recent Labs  Lab 05/18/20 1159 05/19/20 0447 05/20/20 0936 05/21/20 0943 05/22/20 0828  WBC 61.1* 43.0* 29.0* 32.6* 31.2*  CREATININE 1.79* 1.68* 1.71* 1.69* 1.48*    Estimated Creatinine Clearance: 76 mL/min (A) (by C-G formula based on SCr of 1.48 mg/dL (H)).    No Known Allergies  [name] S. 05/24/20, PharmD, BCPS Clinical Staff Pharmacist [email or web address]  [name] 05/23/2020 11:58 AM
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
## 2020-05-24 ENCOUNTER — Inpatient Hospital Stay (HOSPITAL_COMMUNITY): Payer: Self-pay

## 2020-05-24 LAB — TSH: TSH: 7.012 u[IU]/mL — ABNORMAL HIGH (ref 0.350–4.500)

## 2020-05-24 IMAGING — MR MR HUMERUS*R* W/O CM
9 series · 40 of 40 positions shown · non-contrast
Comparison: None.

CLINICAL DATA: IV drug abuser with right shoulder and upper arm
pain and swelling.

EXAM:
MRI OF THE RIGHT HUMERUS WITHOUT CONTRAST; MRI OF THE RIGHT SHOULDER
WITHOUT CONTRAST
TECHNIQUE: Multiplanar, multisequence MR imaging of the right shoulder and
humerus was performed. No intravenous contrast was administered.

[Series 8: T2 fat-sat · axial · right · 5.0mm · 0.33mm/px · z∈[-345,-64]mm · 6 of 50 slices shown (1 of 2)]
[im 1/50]
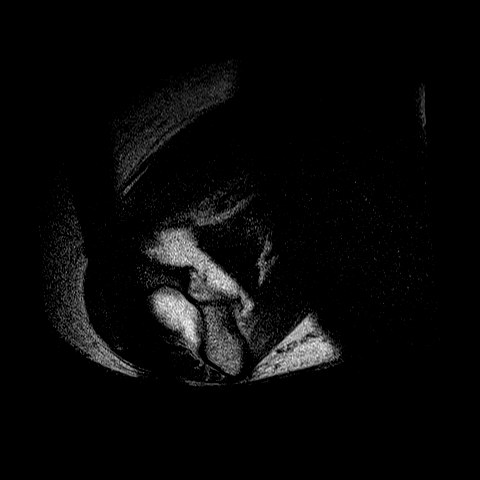
[im 10/50]
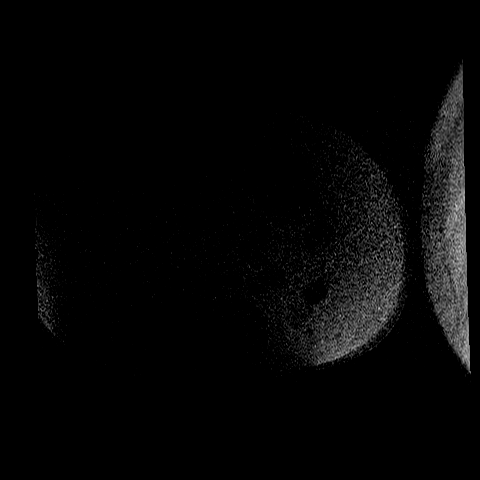
[im 20/50]
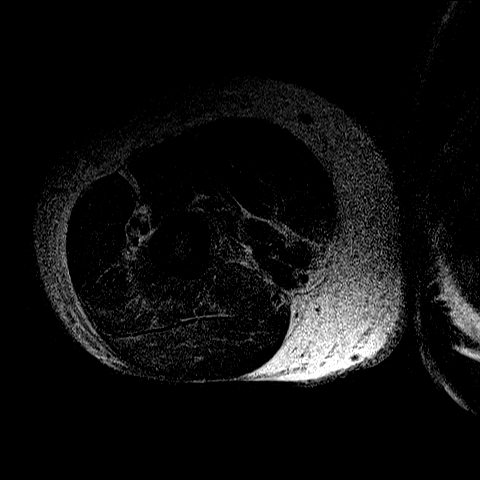
[im 30/50]
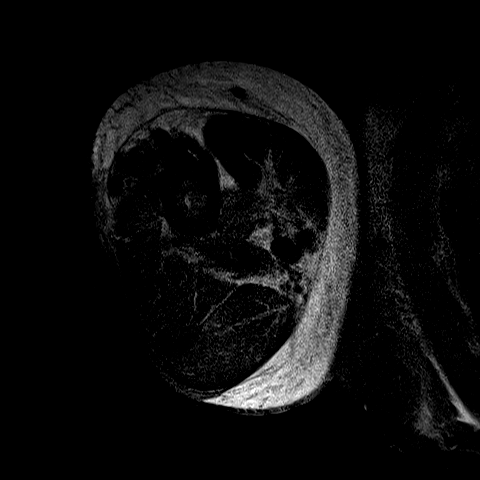
[im 40/50]
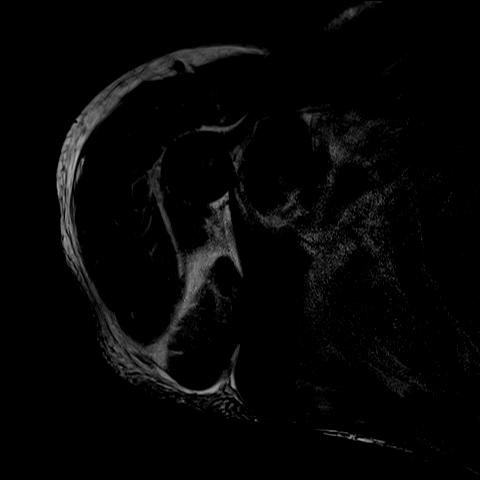
[im 50/50]
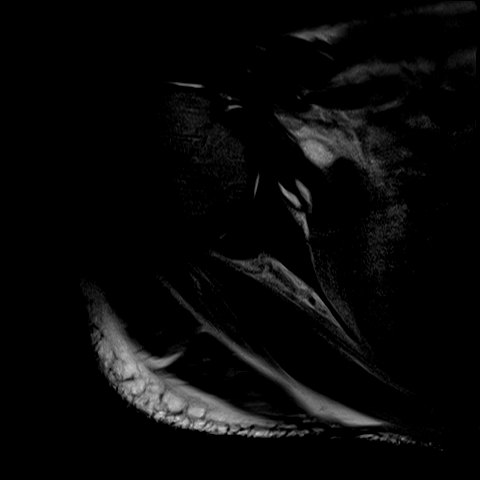

[Series 9: T1 · axial · right · 5.0mm · 0.53mm/px · z∈[-345,-64]mm · 6 of 50 slices shown (1 of 3)]
[im 1/50]
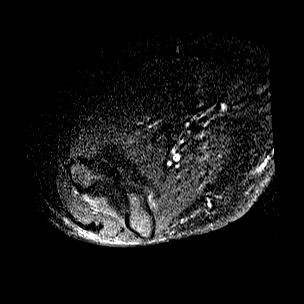
[im 10/50]
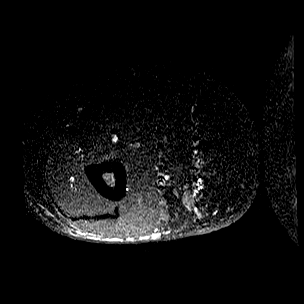
[im 20/50]
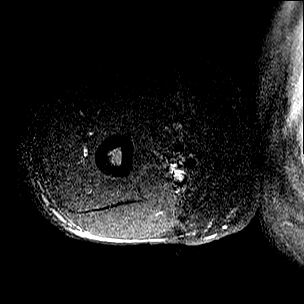
[im 30/50]
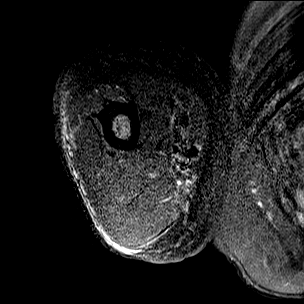
[im 40/50]
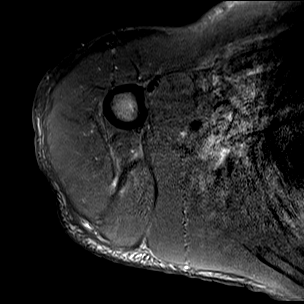
[im 50/50]
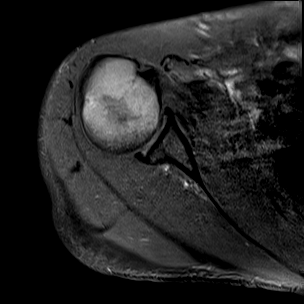

[Series 10: T1 · coronal · right · 4.0mm · 1.22mm/px · 4 of 30 slices shown (2 of 3)]
[im 1/30]
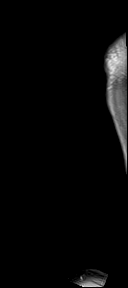
[im 10/30]
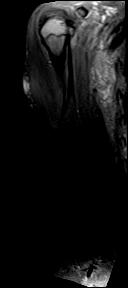
[im 20/30]
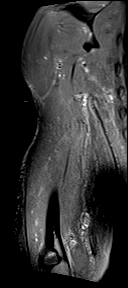
[im 30/30]
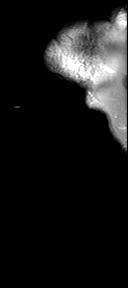

[Series 11: STIR · coronal · right · 4.0mm · 1.22mm/px · 4 of 30 slices shown]
[im 1/30]
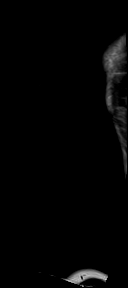
[im 10/30]
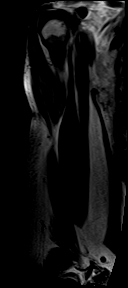
[im 20/30]
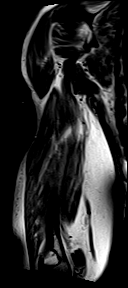
[im 30/30]
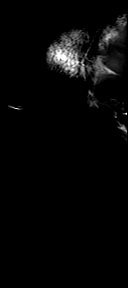

[Series 12: T2 fat-sat · sagittal · right · 4.0mm · 1.15mm/px · 4 of 30 slices shown (2 of 2)]
[im 1/30]
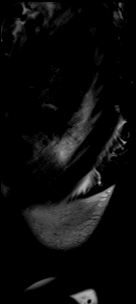
[im 10/30]
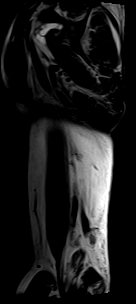
[im 20/30]
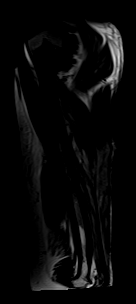
[im 30/30]
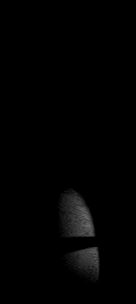

[Series 14: T2 · coronal · right · 4.0mm · 0.62mm/px · 3 of 26 slices shown (1 of 2)]
[im 1/26]
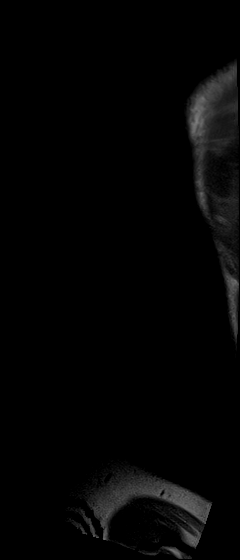
[im 13/26]
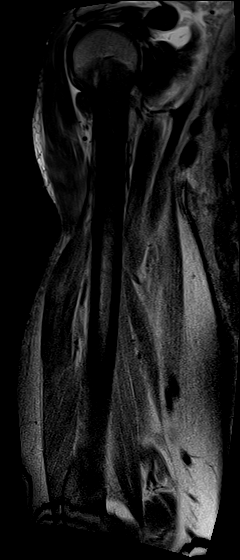
[im 26/26]
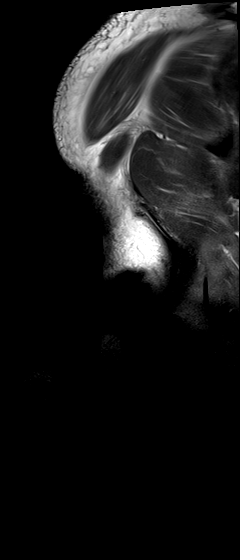

[Series 15: T2 · coronal · right · 4.0mm · 0.62mm/px · 3 of 26 slices shown (2 of 2)]
[im 1/26]
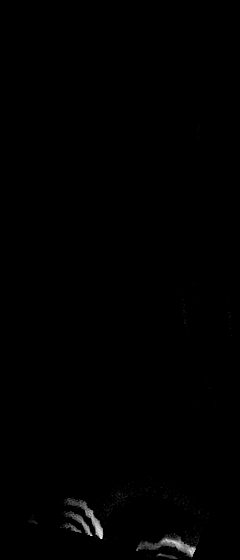
[im 13/26]
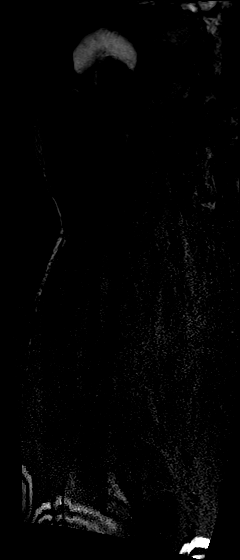
[im 26/26]
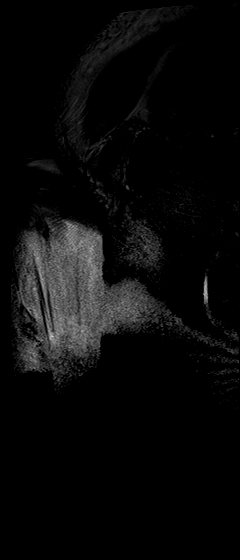

[Series 16: T1 · sagittal · right · 4.0mm · 1.22mm/px · 4 of 30 slices shown (3 of 3)]
[im 1/30]
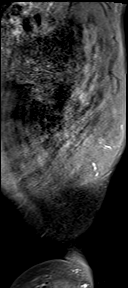
[im 10/30]
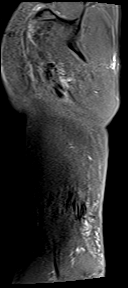
[im 20/30]
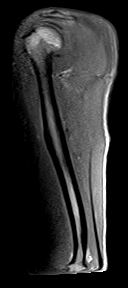
[im 30/30]
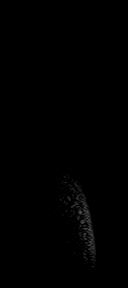

[Series 17: T1 fat-sat · axial · non-contrast · right · 5.0mm · 0.53mm/px · z∈[-351,-59]mm · 6 of 52 slices shown]
[im 1/52]
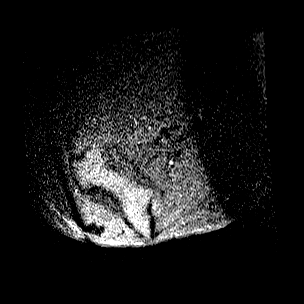
[im 11/52]
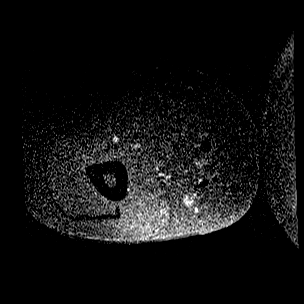
[im 21/52]
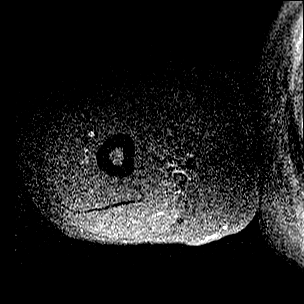
[im 31/52]
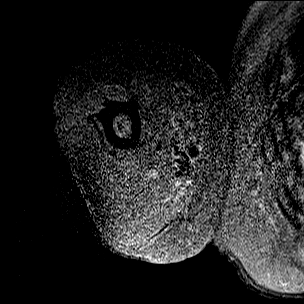
[im 41/52]
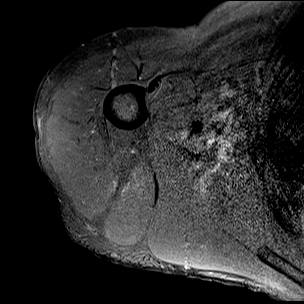
[im 52/52]
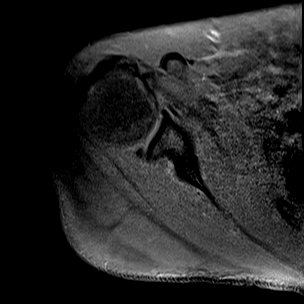

[40 of 40 positions shown; findings below may reference images not displayed]

FINDINGS: Bones/Joint/Cartilage

This examination is limited as the patient was unable to complete
the study and there is little to no fat saturation on any T2
weighted imaging. The patient refused to return for additional
imaging.

No marrow signal change to suggest osteomyelitis is identified. No
glenohumeral joint effusion is present. There is no fracture or
other focal bony abnormality.

Ligaments

Negative.

Muscles and Tendons

No intramuscular fluid collection is identified. No evidence of
strain or tear.

Soft tissues

No abscess is seen. There is stranding in subcutaneous fatty tissues
of the upper arm and about the shoulder.
IMPRESSION: Markedly limited examination is negative for osteomyelitis, myositis
or septic joint.

Subcutaneous edema about the shoulder and upper arm compatible with
cellulitis. No abscess is seen.

## 2020-05-24 IMAGING — MR MR SHOULDER*R* W/O CM
8 series · 40 of 40 positions shown · non-contrast
Comparison: None.

CLINICAL DATA: IV drug abuser with right shoulder and upper arm
pain and swelling.

EXAM:
MRI OF THE RIGHT HUMERUS WITHOUT CONTRAST; MRI OF THE RIGHT SHOULDER
WITHOUT CONTRAST
TECHNIQUE: Multiplanar, multisequence MR imaging of the right shoulder and
humerus was performed. No intravenous contrast was administered.

[Series 6: PD fat-sat · axial · right · 4.0mm · 0.39mm/px · z∈[-145,-31]mm · 5 of 25 slices shown (1 of 2)]
[im 1/25]
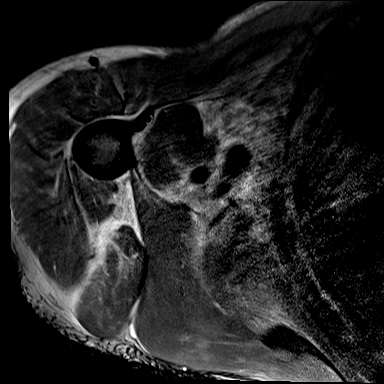
[im 7/25]
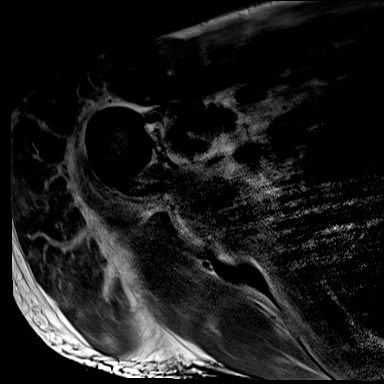
[im 13/25]
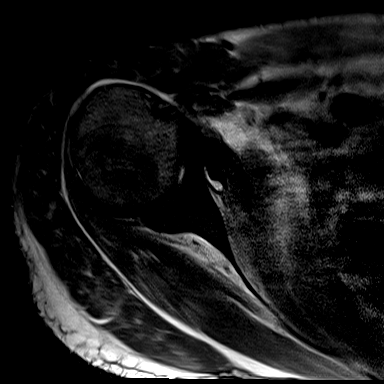
[im 19/25]
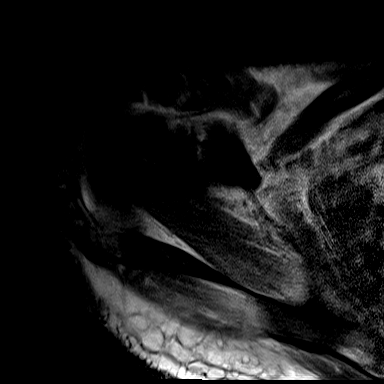
[im 25/25]
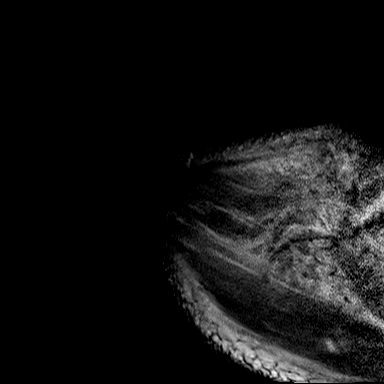

[Series 7: T2 fat-sat · oblique · right · 4.0mm · 0.47mm/px · 5 of 26 slices shown]
[im 1/26]
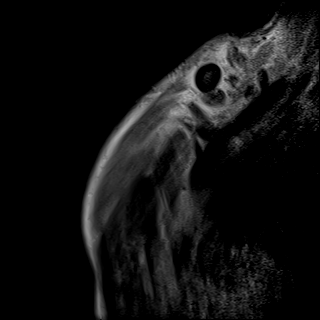
[im 7/26]
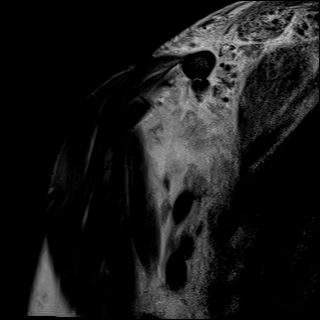
[im 13/26]
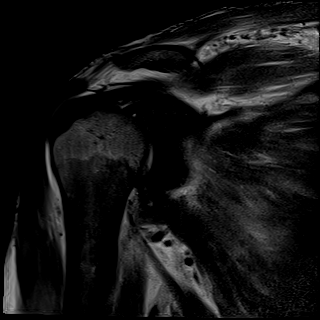
[im 19/26]
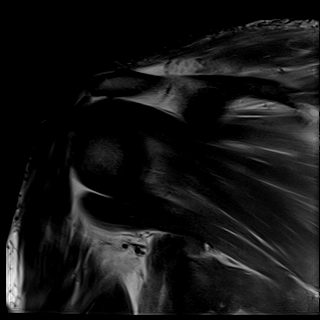
[im 26/26]
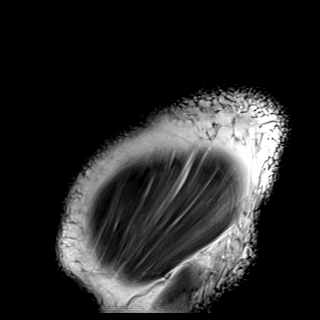

[Series 8: STIR · axial · right · 4.0mm · 0.59mm/px · z∈[-152,-24]mm · 5 of 28 slices shown (1 of 3)]
[im 1/28]
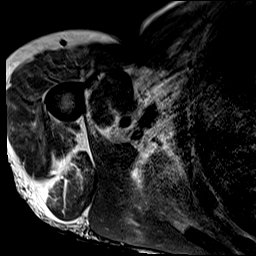
[im 7/28]
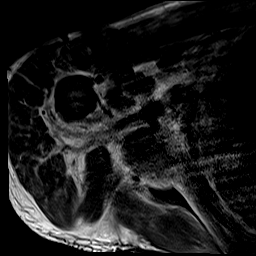
[im 14/28]
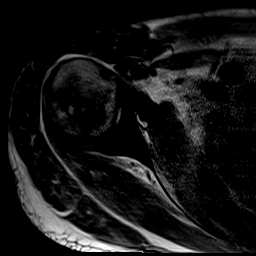
[im 21/28]
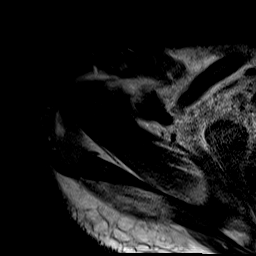
[im 28/28]
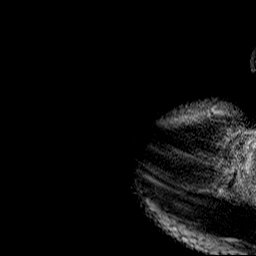

[Series 9: PD fat-sat · oblique · right · 4.0mm · 0.39mm/px · 5 of 26 slices shown (2 of 2)]
[im 1/26]
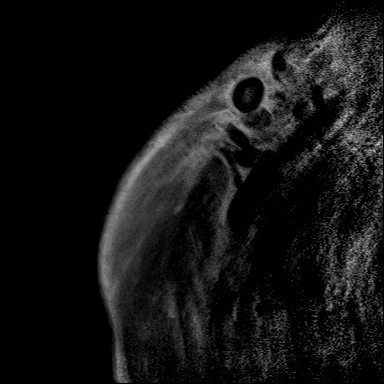
[im 7/26]
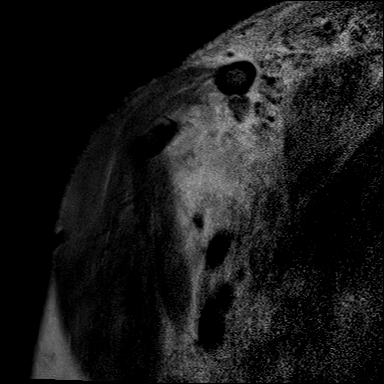
[im 13/26]
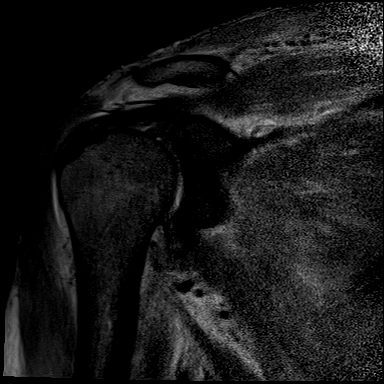
[im 19/26]
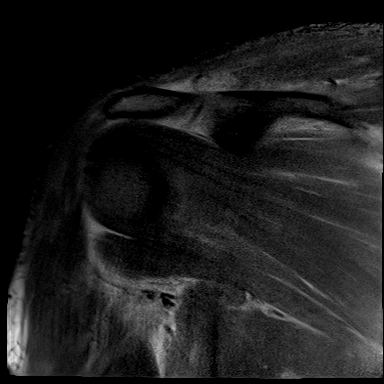
[im 26/26]
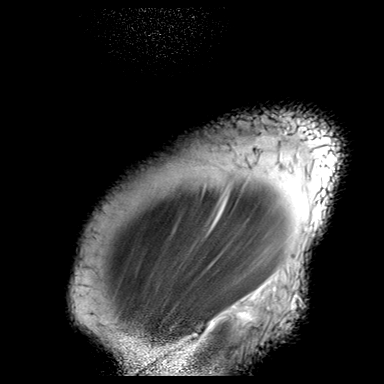

[Series 10: STIR · oblique · right · 4.0mm · 0.59mm/px · 5 of 26 slices shown (2 of 3)]
[im 1/26]
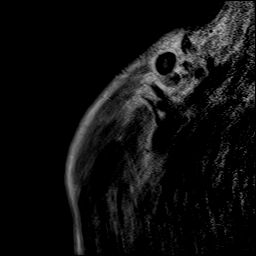
[im 7/26]
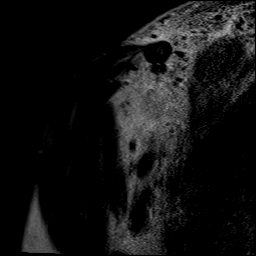
[im 13/26]
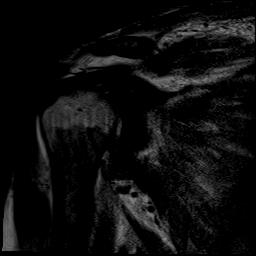
[im 19/26]
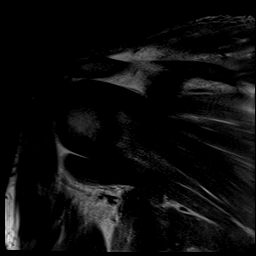
[im 26/26]
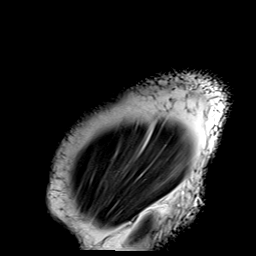

[Series 11: T1 · sagittal · right · 4.0mm · 0.39mm/px · 5 of 28 slices shown]
[im 1/28]
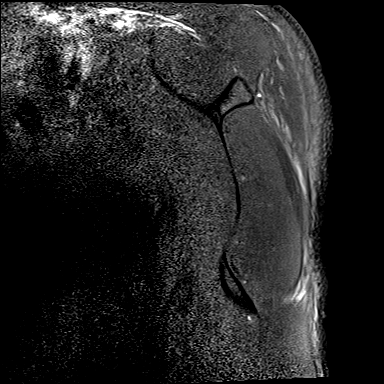
[im 7/28]
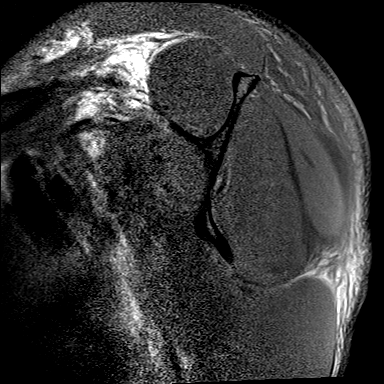
[im 14/28]
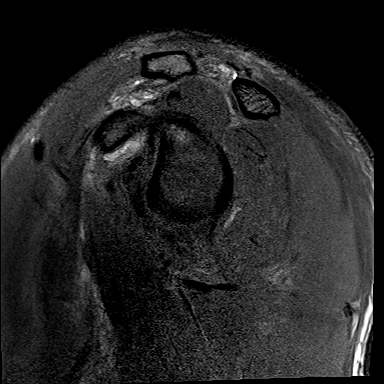
[im 21/28]
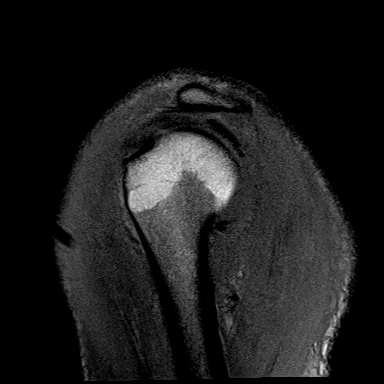
[im 28/28]
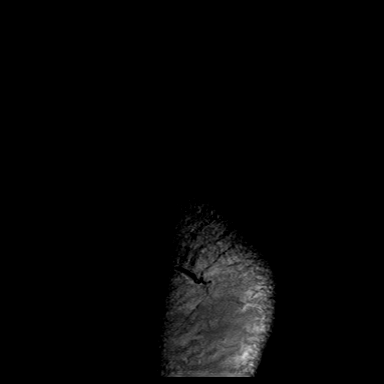

[Series 12: STIR · sagittal · right · 4.0mm · 0.59mm/px · 5 of 28 slices shown (3 of 3)]
[im 1/28]
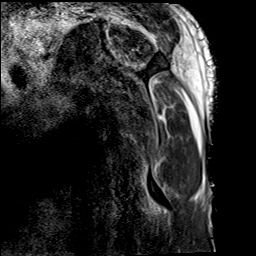
[im 7/28]
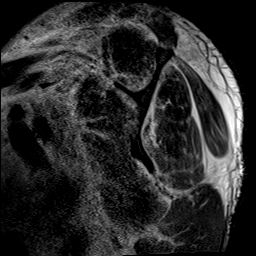
[im 14/28]
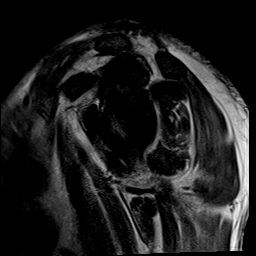
[im 21/28]
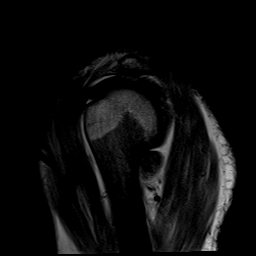
[im 28/28]
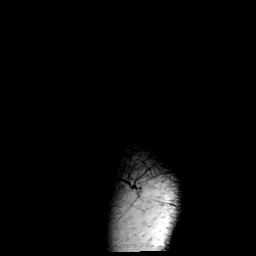

[Series 13: T1 fat-sat · axial · non-contrast · right · 4.0mm · 0.59mm/px · z∈[-145,-19]mm · 5 of 28 slices shown]
[im 1/28]
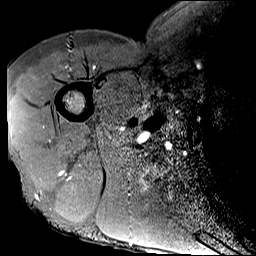
[im 7/28]
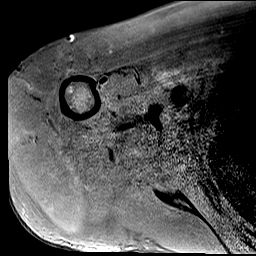
[im 14/28]
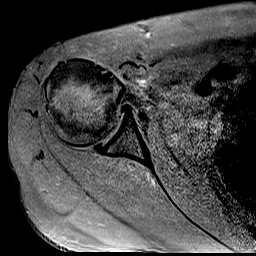
[im 21/28]
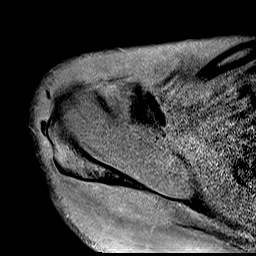
[im 28/28]
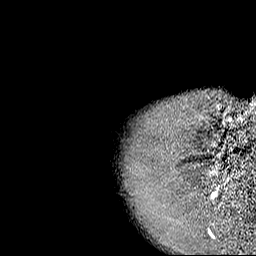

[40 of 40 positions shown; findings below may reference images not displayed]

FINDINGS: Bones/Joint/Cartilage

This examination is limited as the patient was unable to complete
the study and there is little to no fat saturation on any T2
weighted imaging. The patient refused to return for additional
imaging.

No marrow signal change to suggest osteomyelitis is identified. No
glenohumeral joint effusion is present. There is no fracture or
other focal bony abnormality.

Ligaments

Negative.

Muscles and Tendons

No intramuscular fluid collection is identified. No evidence of
strain or tear.

Soft tissues

No abscess is seen. There is stranding in subcutaneous fatty tissues
of the upper arm and about the shoulder.
IMPRESSION: Markedly limited examination is negative for osteomyelitis, myositis
or septic joint.

Subcutaneous edema about the shoulder and upper arm compatible with
cellulitis. No abscess is seen.

## 2020-05-24 MED ORDER — POLYETHYLENE GLYCOL 3350 17 G PO PACK
17.0000 g | PACK | Freq: Two times a day (BID) | ORAL | Status: DC
  Administered 2020-05-24 – 2020-06-12 (×26): 17 g via ORAL
  Filled 2020-05-24 (×35): qty 1

## 2020-05-24 NOTE — Progress Notes (Signed)
Pt only able to tolerate MRI shoulder and humerus pre contrast image. Unable to continue due to pain at this time.

## 2020-05-24 NOTE — Progress Notes (Signed)
Patient continues to refuse MRI. States that he is moving his right arm better and that he wants to 'wait it out' rather than go to MRI again. Patient also continues to state he cannot feed himself or move his arms well enough to help with basic ADLs.
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
## 2020-05-24 NOTE — Progress Notes (Signed)
   05/24/20 2303  Assess: MEWS Score  Temp 99.4 F (37.4 C)  BP 130/76  Pulse Rate (!) 114  ECG Heart Rate (!) 113  Resp 15  Level of Consciousness Alert  SpO2 98 %  O2 Device Nasal Cannula  Patient Activity (if Appropriate) In bed  O2 Flow Rate (L/min) 2 L/min  Assess: MEWS Score  MEWS Temp 0  MEWS Systolic 0  MEWS Pulse 2  MEWS RR 0  MEWS LOC 0  MEWS Score 2  MEWS Score Color Yellow  Assess: if the MEWS score is Yellow or Red  Were vital signs taken at a resting state? Yes  Focused Assessment No change from prior assessment  Early Detection of Sepsis Score *See Row Information* Low  MEWS guidelines implemented *See Row Information* No, previously yellow, continue vital signs every 4 hours  Treat  MEWS Interventions Administered prn meds/treatments  Pain Scale 0-10  Pain Score 10  Pain Descriptors / Indicators Constant  Pain Frequency Constant  Pain Onset On-going  Pain Intervention(s) Medication (See eMAR)  Breathing 0  Negative Vocalization 1  Patients response to intervention Unchanged  Notify: Charge Nurse/RN  Name of Charge Nurse/RN Notified Fred RN  Date Charge Nurse/RN Notified 05/24/20  Time Charge Nurse/RN Notified 2305  Document  Patient Outcome Not stable and remains on department  Progress note created (see row info) Yes  Patient chronic yellow MEWS due to tachycardia. No change in status.

## 2020-05-25 LAB — CBC WITH DIFFERENTIAL/PLATELET
Abs Immature Granulocytes: 1.22 10*3/uL — ABNORMAL HIGH (ref 0.00–0.07)
Basophils Absolute: 0.1 10*3/uL (ref 0.0–0.1)
Basophils Relative: 1 %
Eosinophils Absolute: 0.3 10*3/uL (ref 0.0–0.5)
Eosinophils Relative: 2 %
HCT: 27.2 % — ABNORMAL LOW (ref 39.0–52.0)
Hemoglobin: 8.7 g/dL — ABNORMAL LOW (ref 13.0–17.0)
Immature Granulocytes: 6 %
Lymphocytes Relative: 8 %
Lymphs Abs: 1.6 10*3/uL (ref 0.7–4.0)
MCH: 27.4 pg (ref 26.0–34.0)
MCHC: 32 g/dL (ref 30.0–36.0)
MCV: 85.8 fL (ref 80.0–100.0)
Monocytes Absolute: 1.3 10*3/uL — ABNORMAL HIGH (ref 0.1–1.0)
Monocytes Relative: 6 %
Neutro Abs: 16.5 10*3/uL — ABNORMAL HIGH (ref 1.7–7.7)
Neutrophils Relative %: 77 %
Platelets: 467 10*3/uL — ABNORMAL HIGH (ref 150–400)
RBC: 3.17 MIL/uL — ABNORMAL LOW (ref 4.22–5.81)
RDW: 12.9 % (ref 11.5–15.5)
WBC: 21.1 10*3/uL — ABNORMAL HIGH (ref 4.0–10.5)
nRBC: 0 % (ref 0.0–0.2)

## 2020-05-25 LAB — BASIC METABOLIC PANEL
Anion gap: 9 (ref 5–15)
BUN: 47 mg/dL — ABNORMAL HIGH (ref 6–20)
CO2: 23 mmol/L (ref 22–32)
Calcium: 7.4 mg/dL — ABNORMAL LOW (ref 8.9–10.3)
Chloride: 99 mmol/L (ref 98–111)
Creatinine, Ser: 1.35 mg/dL — ABNORMAL HIGH (ref 0.61–1.24)
GFR calc Af Amer: >60 mL/min (ref >60)
GFR calc non Af Amer: >60 mL/min (ref >60)
Glucose, Bld: 95 mg/dL (ref 70–99)
Potassium: 4.9 mmol/L (ref 3.5–5.1)
Sodium: 131 mmol/L — ABNORMAL LOW (ref 135–145)

## 2020-05-25 MED ORDER — GABAPENTIN 100 MG PO CAPS: 100.0000 mg | ORAL_CAPSULE | Freq: Every day | ORAL | Status: DC

## 2020-05-25 MED ORDER — GABAPENTIN 100 MG PO CAPS
100.0000 mg | ORAL_CAPSULE | Freq: Once | ORAL | Status: AC
  Administered 2020-05-25: 100 mg via ORAL
  Filled 2020-05-25: qty 1

## 2020-05-25 NOTE — Plan of Care (Signed)
  Problem: Clinical Measurements: Goal: Cardiovascular complication will be avoided Outcome: Progressing   Problem: Coping: Goal: Level of anxiety will decrease Outcome: Progressing   Problem: Elimination: Goal: Will not experience complications related to urinary retention Outcome: Progressing   

## 2020-05-25 NOTE — Social Work (Signed)
MSW intern met with patient to provide information on homeless resources. Patient reported he has no family in the area. Patient is not sure where he is going when he leaves the hospital. SW provided patient with resources. MSW intern notes patient was moaning and said he was waiting to get his pain medicine.

## 2020-05-25 NOTE — Progress Notes (Signed)
   05/24/20 0748  Assess: MEWS Score  Temp 98.9 F (37.2 C)  BP 132/65  Pulse Rate (!) 114  ECG Heart Rate (!) 111  Resp 18  Level of Consciousness Alert  SpO2 98 %  O2 Device Nasal Cannula  Patient Activity (if Appropriate) In bed  O2 Flow Rate (L/min) 3 L/min  Assess: MEWS Score  MEWS Temp 0  MEWS Systolic 0  MEWS Pulse 2  MEWS RR 0  MEWS LOC 0  MEWS Score 2  MEWS Score Color Yellow  Assess: if the MEWS score is Yellow or Red  Were vital signs taken at a resting state? Yes  Focused Assessment No change from prior assessment  Early Detection of Sepsis Score *See Row Information* Low  MEWS guidelines implemented *See Row Information* No, previously yellow, continue vital signs every 4 hours  Treat  Pain Scale 0-10  Pain Score 10   Patient continues to have elevated HR. No changes to medications. Continue scheduled and PRN meds as needed

## 2020-05-25 NOTE — Progress Notes (Signed)
   05/25/20 2000  Assess: MEWS Score  Temp 99.2 F (37.3 C)  BP 138/69  Pulse Rate (!) 113  ECG Heart Rate (!) 112  Resp 19  Level of Consciousness Alert  SpO2 99 %  O2 Device Nasal Cannula  Patient Activity (if Appropriate) In bed  O2 Flow Rate (L/min) 3 L/min  Assess: MEWS Score  MEWS Temp 0  MEWS Systolic 0  MEWS Pulse 2  MEWS RR 0  MEWS LOC 0  MEWS Score 2  MEWS Score Color Yellow  Assess: if the MEWS score is Yellow or Red  Were vital signs taken at a resting state? Yes  Focused Assessment No change from prior assessment  Early Detection of Sepsis Score *See Row Information* Low  MEWS guidelines implemented *See Row Information* No, previously yellow, continue vital signs every 4 hours  Treat  MEWS Interventions Administered prn meds/treatments  Pain Scale 0-10  Pain Score 10  Escalate  MEWS: Escalate Yellow: discuss with charge nurse/RN and consider discussing with provider and RRT  Notify: Charge Nurse/RN  Name of Charge Nurse/RN Notified Jequetta  Date Charge Nurse/RN Notified 05/25/20  Time Charge Nurse/RN Notified 2007  Document  Patient Outcome Not stable and remains on department  Progress note created (see row info) Yes  Chronic yellow MEWS

## 2020-05-25 NOTE — Progress Notes (Signed)
   05/25/20 0753  Assess: MEWS Score  Temp 98.2 F (36.8 C)  BP 124/73  Pulse Rate (!) 112  ECG Heart Rate (!) 113  Resp 16  Level of Consciousness Alert  SpO2 98 %  O2 Device Nasal Cannula  O2 Flow Rate (L/min) 3 L/min  Assess: MEWS Score  MEWS Temp 0  MEWS Systolic 0  MEWS Pulse 2  MEWS RR 0  MEWS LOC 0  MEWS Score 2  MEWS Score Color Yellow  Assess: if the MEWS score is Yellow or Red  Were vital signs taken at a resting state? Yes  Focused Assessment No change from prior assessment  Early Detection of Sepsis Score *See Row Information* Low  MEWS guidelines implemented *See Row Information* No, previously yellow, continue vital signs every 4 hours  Treat  Patients response to intervention Unchanged  Notify: Charge Nurse/RN  Name of Charge Nurse/RN Notified n/a  Notify: Provider  Provider Name/Title n/a  Notify: Rapid Response  Name of Rapid Response RN Notified n/a  Document  Progress note created (see row info) Yes   Patient HR 110-115. Scheduled Beta blocker due at 10am. Pain meds given prior to shift change and patient remains afebrile. Will continue to monitor and give scheduled and PRN meds as needed
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
## 2020-05-25 NOTE — Progress Notes (Addendum)
Patient calling out for pain medication every 1.5 hours. Patient given education on the medication use and orders and that IV dilaudid is prescribed to be given for breakthrough pain if oral medication is not sufficient. Will continue to reinforce teaching. Repositioning offered to the patient but states he is in too much pain to move more than necessary.   05/25/20 4:23 PM  Patient with continued concerns he is not receiving his pain medication at appropriate frequency. Dr. Roda Shutters paged and she was able to discuss with this RN and the patient simultaneously about the plan of care for pain management and the appropriate time frame of medications. Patient states he understands the time intervals for medications and that IV dilaudid is to be used for breakthrough pain every 2 hours if needed and that it is not automatically scheduled every 2 hours.

## 2020-05-26 LAB — CBC WITH DIFFERENTIAL/PLATELET
Abs Immature Granulocytes: 1.24 10*3/uL — ABNORMAL HIGH (ref 0.00–0.07)
Basophils Absolute: 0.1 10*3/uL (ref 0.0–0.1)
Basophils Relative: 0 %
Eosinophils Absolute: 0.4 10*3/uL (ref 0.0–0.5)
Eosinophils Relative: 2 %
HCT: 25.8 % — ABNORMAL LOW (ref 39.0–52.0)
Hemoglobin: 8.2 g/dL — ABNORMAL LOW (ref 13.0–17.0)
Immature Granulocytes: 7 %
Lymphocytes Relative: 10 %
Lymphs Abs: 1.8 10*3/uL (ref 0.7–4.0)
MCH: 27.2 pg (ref 26.0–34.0)
MCHC: 31.8 g/dL (ref 30.0–36.0)
MCV: 85.7 fL (ref 80.0–100.0)
Monocytes Absolute: 1 10*3/uL (ref 0.1–1.0)
Monocytes Relative: 6 %
Neutro Abs: 14.2 10*3/uL — ABNORMAL HIGH (ref 1.7–7.7)
Neutrophils Relative %: 75 %
Platelets: 482 10*3/uL — ABNORMAL HIGH (ref 150–400)
RBC: 3.01 MIL/uL — ABNORMAL LOW (ref 4.22–5.81)
RDW: 13.1 % (ref 11.5–15.5)
WBC: 18.7 10*3/uL — ABNORMAL HIGH (ref 4.0–10.5)
nRBC: 0 % (ref 0.0–0.2)

## 2020-05-26 LAB — T4, FREE: Free T4: 0.48 ng/dL — ABNORMAL LOW (ref 0.61–1.12)

## 2020-05-26 LAB — TSH: TSH: 7.127 u[IU]/mL — ABNORMAL HIGH (ref 0.350–4.500)

## 2020-05-26 LAB — BASIC METABOLIC PANEL
Anion gap: 3 — ABNORMAL LOW (ref 5–15)
BUN: 43 mg/dL — ABNORMAL HIGH (ref 6–20)
CO2: 28 mmol/L (ref 22–32)
Calcium: 7.3 mg/dL — ABNORMAL LOW (ref 8.9–10.3)
Chloride: 99 mmol/L (ref 98–111)
Creatinine, Ser: 1.46 mg/dL — ABNORMAL HIGH (ref 0.61–1.24)
GFR calc Af Amer: >60 mL/min (ref >60)
GFR calc non Af Amer: >60 mL/min (ref >60)
Glucose, Bld: 100 mg/dL — ABNORMAL HIGH (ref 70–99)
Potassium: 4.7 mmol/L (ref 3.5–5.1)
Sodium: 130 mmol/L — ABNORMAL LOW (ref 135–145)

## 2020-05-26 LAB — MAGNESIUM: Magnesium: 1.6 mg/dL — ABNORMAL LOW (ref 1.7–2.4)

## 2020-05-26 MED ORDER — MAGNESIUM SULFATE 2 GM/50ML IV SOLN
2.0000 g | Freq: Once | INTRAVENOUS | Status: AC
  Administered 2020-05-26: 2 g via INTRAVENOUS
  Filled 2020-05-26: qty 50

## 2020-05-26 MED ORDER — GABAPENTIN 100 MG PO CAPS
200.0000 mg | ORAL_CAPSULE | Freq: Every day | ORAL | Status: DC
  Administered 2020-05-26 – 2020-05-27 (×2): 200 mg via ORAL
  Filled 2020-05-26: qty 2

## 2020-05-26 NOTE — Progress Notes (Signed)
    [facility]   Reason for visit: Follow up on cellulitis and myofasciitis  Interval History: he is currently with OT/PT trying to move.  Complains of pain.  WBC continues to improve to 18.7 and no new issues.  MRI done and no significant concerns noted on a limited exam.     Physical Exam: Constitutional:  Vitals:   05/26/20 0320 05/26/20 0400  BP:  115/68  Pulse:  100  Resp:  15  Temp: 98.8 F (37.1 C)   SpO2:  97%   patient appears in NAD Respiratory: Normal respiratory effort; CTA B Cardiovascular: RRR GI: soft, nt, nd  Review of Systems: Constitutional: negative for fevers and chills Gastrointestinal: negative for nausea and diarrhea  Lab Results  Component Value Date   WBC 18.7 (H) 05/26/2020   HGB 8.2 (L) 05/26/2020   HCT 25.8 (L) 05/26/2020   MCV 85.7 05/26/2020   PLT 482 (H) 05/26/2020    Lab Results  Component Value Date   CREATININE 1.46 (H) 05/26/2020   BUN 43 (H) 05/26/2020   NA 130 (L) 05/26/2020   K 4.7 05/26/2020   CL 99 05/26/2020   CO2 28 05/26/2020    Lab Results  Component Value Date   ALT 17 05/21/2020   AST 29 05/21/2020   ALKPHOS 87 05/21/2020     Microbiology: Recent Results (from the past 240 hour(s))  Body fluid culture (includes gram stain)     Status: None   Collection Time: 05/19/20  2:16 PM   Specimen: Pleural Fluid  Result Value Ref Range Status   Specimen Description FLUID PLEURAL RIGHT  Final   Special Requests NONE  Final   Gram Stain NO WBC SEEN NO ORGANISMS SEEN   Final   Culture   Final    NO GROWTH 3 DAYS Performed at [facility], [address], [place], [place] [address]    Report Status 05/23/2020 FINAL  Final    Impression/Plan:  1. Extensive cellulitis with myofasciitis - no drainable area and continuing management with antibiotics on daptomycin  Dose previously adjusted to 10 mg/kg and his fever curve and WBC improved.   He will need a prolonged course of antibiotics.     2.  Bacteremia - repeat blood cultures ngtd.  TEE negative for vegetation.    3. Disposition - he will need to continue with prolonged antibiotics.  If he continues to do well, can consider transitioning him to long acting infusions and/or oral linezolid later this week or next week which he can do as an outpatient or from SNF/rehab (since he has been non-ambulatory).

## 2020-05-26 NOTE — Progress Notes (Signed)
Mg 1.6 this AM. Ok to give mag sulfate 2g IV x1 per Dr. Benjamine Mola.  Ulyses Southward, PharmD, BCIDP, AAHIVP, CPP Infectious Disease Pharmacist 05/26/2020 10:09 AM
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
## 2020-05-27 ENCOUNTER — Inpatient Hospital Stay (HOSPITAL_COMMUNITY): Payer: Self-pay

## 2020-05-27 LAB — CBC
HCT: 26.2 % — ABNORMAL LOW (ref 39.0–52.0)
Hemoglobin: 8.2 g/dL — ABNORMAL LOW (ref 13.0–17.0)
MCH: 26.9 pg (ref 26.0–34.0)
MCHC: 31.3 g/dL (ref 30.0–36.0)
MCV: 85.9 fL (ref 80.0–100.0)
Platelets: 513 10*3/uL — ABNORMAL HIGH (ref 150–400)
RBC: 3.05 MIL/uL — ABNORMAL LOW (ref 4.22–5.81)
RDW: 13 % (ref 11.5–15.5)
WBC: 15.4 10*3/uL — ABNORMAL HIGH (ref 4.0–10.5)
nRBC: 0 % (ref 0.0–0.2)

## 2020-05-27 LAB — BLOOD GAS, ARTERIAL
Acid-Base Excess: 2.5 mmol/L — ABNORMAL HIGH (ref 0.0–2.0)
Bicarbonate: 26.7 mmol/L (ref 20.0–28.0)
Drawn by: 519031
FIO2: 80
O2 Saturation: 88.6 %
Patient temperature: 37
pCO2 arterial: 43.1 mmHg (ref 32.0–48.0)
pH, Arterial: 7.409 (ref 7.350–7.450)
pO2, Arterial: 58.7 mmHg — ABNORMAL LOW (ref 83.0–108.0)

## 2020-05-27 LAB — BASIC METABOLIC PANEL
Anion gap: 8 (ref 5–15)
BUN: 41 mg/dL — ABNORMAL HIGH (ref 6–20)
CO2: 23 mmol/L (ref 22–32)
Calcium: 7.4 mg/dL — ABNORMAL LOW (ref 8.9–10.3)
Chloride: 98 mmol/L (ref 98–111)
Creatinine, Ser: 1.51 mg/dL — ABNORMAL HIGH (ref 0.61–1.24)
GFR calc Af Amer: >60 mL/min (ref >60)
GFR calc non Af Amer: >60 mL/min (ref >60)
Glucose, Bld: 100 mg/dL — ABNORMAL HIGH (ref 70–99)
Potassium: 4.8 mmol/L (ref 3.5–5.1)
Sodium: 129 mmol/L — ABNORMAL LOW (ref 135–145)

## 2020-05-27 LAB — CK: Total CK: 18 U/L — ABNORMAL LOW (ref 49–397)

## 2020-05-27 IMAGING — DX DG CHEST 1V PORT
1 series · 1 of 1 positions shown · non-contrast
Comparison: Film from earlier in the same day

CLINICAL DATA: Hypoxia

EXAM:
PORTABLE CHEST 1 VIEW

[chest ap]
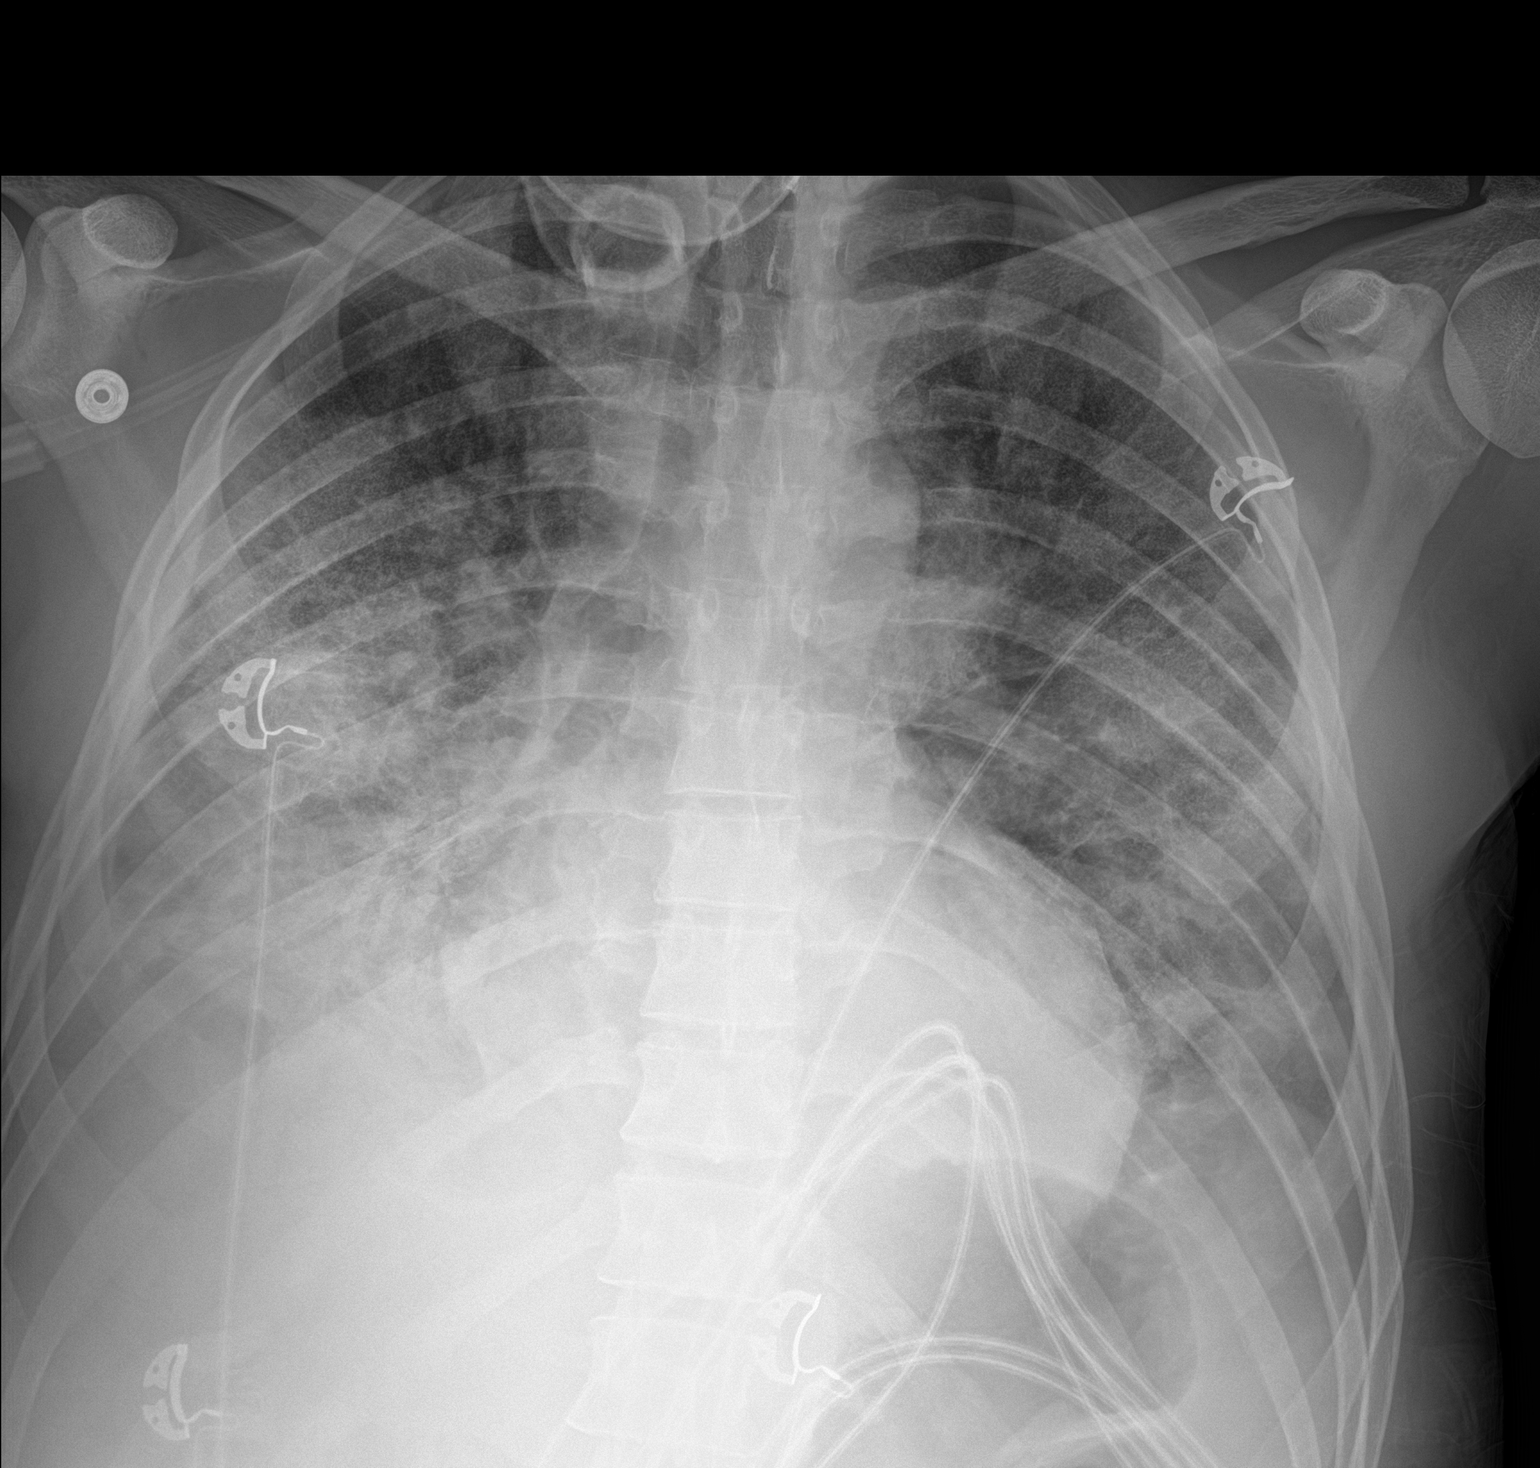

[1 of 1 positions shown; findings below may reference images not displayed]

FINDINGS: Cardiac shadow is stable. Diffuse airspace opacity is again
identified throughout both lungs stable in appearance from the
earlier exam. Pleural effusions are noted right greater than left.
No pneumothorax is seen. No bony abnormality is noted.
IMPRESSION: Given some technical variation in the film, no significant interval
changes noted.

## 2020-05-27 IMAGING — DX DG CHEST 1V PORT
1 series · 1 of 1 positions shown · non-contrast
Comparison: [DATE]

CLINICAL DATA: Shortness of breath

EXAM:
PORTABLE CHEST 1 VIEW

[chest]
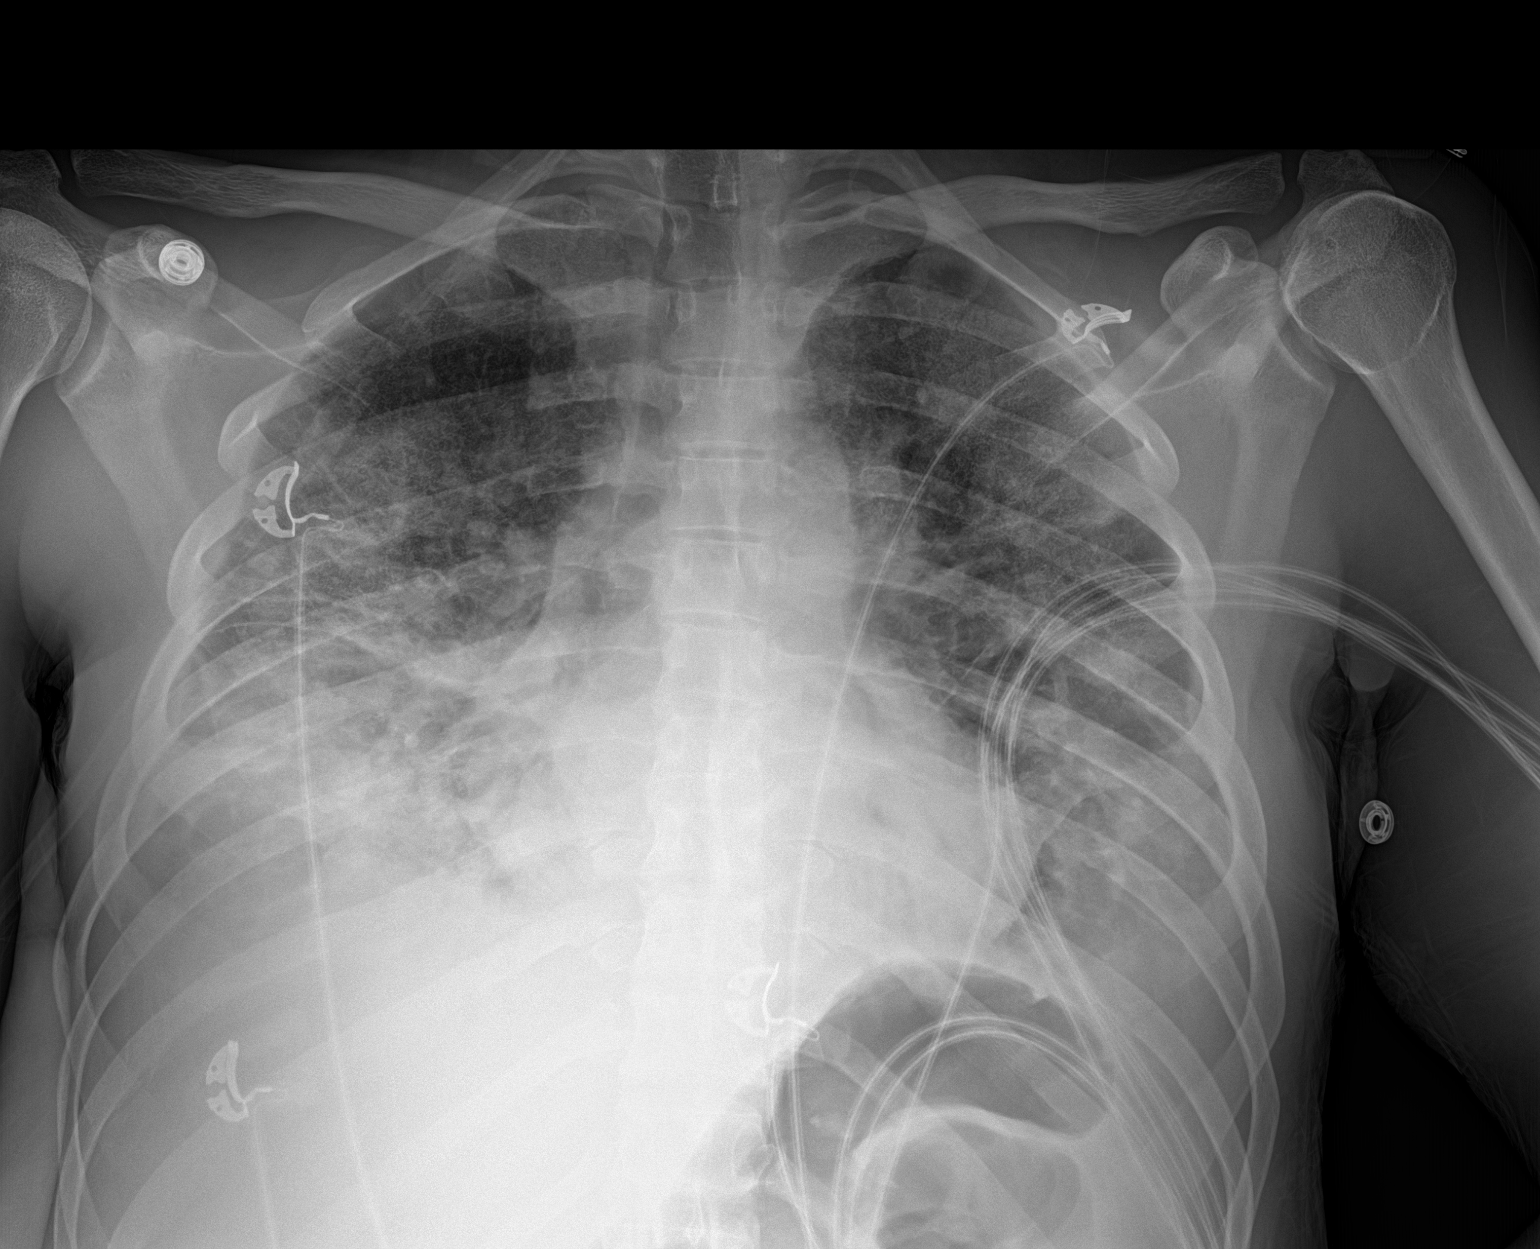

[1 of 1 positions shown; findings below may reference images not displayed]

FINDINGS: The cardiomediastinal silhouette is unchanged in contour. There are
moderate bilateral pleural effusions. No pneumothorax. There are
increased bilateral basilar predominant heterogeneous opacities.
Diffuse interstitial prominence, increased. Visualized abdomen is
unremarkable. No acute osseous abnormality.
IMPRESSION: Moderate bilateral pleural effusions with increased bilateral
basilar predominant heterogeneous opacities on a background of
diffuse interstitial prominence. Findings may be related to
pulmonary edema versus multifocal pneumonia.

## 2020-05-27 MED ORDER — PROSOURCE PLUS PO LIQD
30.0000 mL | Freq: Two times a day (BID) | ORAL | Status: DC
  Administered 2020-05-27 – 2020-06-13 (×24): 30 mL via ORAL
  Filled 2020-05-27 (×26): qty 30

## 2020-05-27 MED ORDER — OXYMETAZOLINE HCL 0.05 % NA SOLN
1.0000 | Freq: Two times a day (BID) | NASAL | Status: DC
  Administered 2020-05-27 – 2020-05-28 (×3): 1 via NASAL
  Filled 2020-05-27: qty 30

## 2020-05-27 MED ORDER — FUROSEMIDE 10 MG/ML IJ SOLN
40.0000 mg | Freq: Once | INTRAMUSCULAR | Status: AC
  Administered 2020-05-27: 40 mg via INTRAVENOUS
  Filled 2020-05-27: qty 4

## 2020-05-27 MED ORDER — GUAIFENESIN ER 600 MG PO TB12
1200.0000 mg | ORAL_TABLET | Freq: Two times a day (BID) | ORAL | Status: DC
  Administered 2020-05-27 – 2020-07-02 (×72): 1200 mg via ORAL
  Filled 2020-05-27 (×72): qty 2

## 2020-05-27 MED ORDER — FUROSEMIDE 10 MG/ML IJ SOLN
20.0000 mg | Freq: Once | INTRAMUSCULAR | Status: AC
  Administered 2020-05-27: 20 mg via INTRAVENOUS
  Filled 2020-05-27: qty 2

## 2020-05-27 MED ORDER — HYDROMORPHONE HCL 1 MG/ML IJ SOLN
0.5000 mg | INTRAMUSCULAR | Status: DC | PRN
  Administered 2020-05-27 – 2020-06-10 (×115): 0.5 mg via INTRAVENOUS
  Filled 2020-05-27 (×114): qty 0.5

## 2020-05-27 MED ORDER — ENOXAPARIN SODIUM 40 MG/0.4ML ~~LOC~~ SOLN
40.0000 mg | SUBCUTANEOUS | Status: DC
  Administered 2020-05-27 – 2020-07-01 (×35): 40 mg via SUBCUTANEOUS
  Filled 2020-05-27 (×36): qty 0.4

## 2020-05-27 NOTE — Progress Notes (Addendum)
Pharmacy Antibiotic Note  [name] is a 29 y.o. male admitted on 05/10/2020 with  MRSA bacteremia (TEE neg), R shoulder septic arthritis, L knee diffuse cellulitis/abscess.  Pharmacy has been consulted for Daptomycin dosing.  MRSA bacteremia (TEE neg), R shoulder septic arthritis, L knee diffuse cellulitis/abscess - 9/25: MRI significantly worse celluliits/myofasciitis - increased dapto. - TTE 9/20with thin mobile density below the TV annulus. Unclear whether  this is a vegetation or eustacion valve.  - TEE 9/22 no vegetations. - Surg consult r/o nec fasc -- Hep C positive, Hep A Ab reactive, hep B negative  Vanc x 1 on 9/18 CTX x 1 on 9/18 Zyvox  9/18>> 9/21 Dapto 9/21 >> (increased 9/25) >> Orita x1 >> (9/27) Cipro 9/26 >>9/28  9/21 CK 72 9/24 CK 24 9/25 CK 18 9/28:CK 14 10/5 18  9/18 urine: negative 9/18 COVID: negative 9/18 BCx: 1/4 bottles w/ Staph aureus 9/18: BCID Staph, mecA detected 9/20 repeat BCx: negF 9/27 blood>>negF  Plan is to continue dapto with the possibility of changing to oritavancin +- linezolid per ID for an extended course.   Plan:  - Dapto 800 mg (10 mg/kg) IV q24h Day 18 - Weekly CK   Height: 5\' 10"  (177.8 cm) Weight: 78 kg (171 lb 15.3 oz) IBW/kg (Calculated) : 73  Temp (24hrs), Avg:98.4 F (36.9 C), Min:98 F (36.7 C), Max:98.6 F (37 C)  Recent Labs  Lab 05/22/20 0828 05/23/20 1530 05/25/20 0327 05/26/20 0358 05/27/20 0628  WBC 31.2* 24.8* 21.1* 18.7* 15.4*  CREATININE 1.48* 1.36* 1.35* 1.46* 1.51*    Estimated Creatinine Clearance: 74.5 mL/min (A) (by C-G formula based on SCr of 1.51 mg/dL (H)).    No Known Allergies  07/27/20, PharmD, BCIDP, AAHIVP, CPP Infectious Disease Pharmacist 05/27/2020 9:05 AM   Addendum:  Ok to change SQ heparin to Lovenox for DVT prophylaxis per Dr. 07/27/2020.  Benjamine Mola, PharmD, BCIDP, AAHIVP, CPP Infectious Disease Pharmacist 05/27/2020 9:27 AM

## 2020-05-27 NOTE — Progress Notes (Addendum)
Received pt. On YELLOW MEWS
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
## 2020-05-27 NOTE — Progress Notes (Signed)
   05/26/20 2041  Assess: MEWS Score  Level of Consciousness Alert  Assess: MEWS Score  MEWS Temp 0  MEWS Systolic 0  MEWS Pulse 1  MEWS RR 1  MEWS LOC 0  MEWS Score 2  MEWS Score Color Yellow  Assess: if the MEWS score is Yellow or Red  Were vital signs taken at a resting state? Yes  Focused Assessment No change from prior assessment  Early Detection of Sepsis Score *See Row Information* Low  MEWS guidelines implemented *See Row Information* No, previously yellow, continue vital signs every 4 hours  Document  Patient Outcome Other (Comment) (chronic yellow this admit)  Progress note created (see row info) Yes   Patient is a yellow mews due to rr and hr. Pt has been yellow many times this admission.  VS will continue every 4 hours

## 2020-05-27 NOTE — Progress Notes (Signed)
   05/27/20 2000  Assess: MEWS Score  Temp 98.8 F (37.1 C)  BP 120/74  Pulse Rate (!) 105  ECG Heart Rate (!) 106  Resp (!) 30  SpO2 100 %  O2 Device HFNC;Non-rebreather Mask (heated )  Patient Activity (if Appropriate) In bed  O2 Flow Rate (L/min) 30 L/min  FiO2 (%) 100 %  Assess: MEWS Score  MEWS Temp 0  MEWS Systolic 0  MEWS Pulse 1  MEWS RR 2  MEWS LOC 0  MEWS Score 3  MEWS Score Color Yellow  Assess: if the MEWS score is Yellow or Red  Were vital signs taken at a resting state? Yes  Focused Assessment No change from prior assessment  Early Detection of Sepsis Score *See Row Information* Low  MEWS guidelines implemented *See Row Information* No, vital signs rechecked  Treat  Pain Scale 0-10  Pain Score 10  Pain Type Chronic pain  Pain Location Generalized  Pain Descriptors / Indicators Constant;Crushing;Discomfort  Pain Frequency Constant  Pain Onset On-going  Pain Intervention(s) Medication (See eMAR);Repositioned  Interventions Medication (see MAR)  Patients response to intervention  (N/A-Ongoing)  Take Vital Signs  Increase Vital Sign Frequency   (N/A ongoing issue)  Escalate  MEWS: Escalate Yellow: discuss with charge nurse/RN and consider discussing with provider and RRT  Notify: Charge Nurse/RN  Name of Charge Nurse/RN Notified Londra,RN  Date Charge Nurse/RN Notified 05/27/20  Time Charge Nurse/RN Notified 2000  Notify: Provider  Provider Name/Title  (N/a- Ongoing)  Notify: Rapid Response  Name of Rapid Response RN Notified  (N/A ongoing)

## 2020-05-27 NOTE — Progress Notes (Signed)
RT NOTES:  ABG obtained and sent to lab. Lab tech Kim notified. 

## 2020-05-27 NOTE — Progress Notes (Signed)
Pt O2 stats between 82 and 88 , pt has increased respirations and states "its getting harder to breathe especially when drinking water or talking" MD notified of status   Current vitals  BP (!) 110/58   Pulse (!) 101   Temp 98.6 F (37 C) (Oral)   Resp (!) 38   Ht 5\' 10"  (1.778 m)   Wt 78 kg   SpO2 94%   BMI 24.67 kg/m    respiratory in the room  Will continue to monitor

## 2020-05-27 NOTE — Progress Notes (Signed)
RT NOTES: Called by RN stating patient was desaturating. Placed patient on a heated high flow nasal cannula 30L/100%. Patient also requested to have NRB mask on top as well. Sats now 96%. Will continue to monitor.

## 2020-05-27 NOTE — Therapy (Signed)
Pt stated that he could not breath from his nose and we flushed and suctioned out his nose and a large plug came from the left nare appeared to be dried blood a secretions. Pt tolerated well.

## 2020-05-27 NOTE — Progress Notes (Signed)
Patient had several episodes of de-saturation this PM.  Gave 1 dose of IV lasix with 2.8L response.  BP stable in the 140s so will give another dose of IV lasix (20 mg).  Repeat x ray in 2 hours.  Can consider PRN Bipap as well.

## 2020-05-27 NOTE — Progress Notes (Signed)
RT NOTES: Patient placed on 15L HFNC, sats now 95%. Patient states he is breathing easier at this time. Patient in no notable distress. Will continue to monitor.

## 2020-05-27 NOTE — Progress Notes (Signed)
Pt called out for assistance , Upon entering room pt o2 stat 66, pt had labored breathing and stated "help I don't feel too good" , he was then placed on nonrebreather.  MD notified  respiratory notified  Will continue to monitor

## 2020-05-27 NOTE — Plan of Care (Signed)
  Problem: Education: Goal: Knowledge of General Education information will improve Description: Including pain rating scale, medication(s)/side effects and non-pharmacologic comfort measures Outcome: Progressing   Problem: Clinical Measurements: Goal: Respiratory complications will improve Outcome: Progressing  Patient Currently on heated highflow and NRB mask.  Pt. Maintaining spo2 >92%   Problem: Clinical Measurements: Goal: Cardiovascular complication will be avoided Outcome: Progressing   Problem: Activity: Goal: Risk for activity intolerance will decrease Outcome: Progressing   Problem: Nutrition: Goal: Adequate nutrition will be maintained Outcome: Progressing   Problem: Coping: Goal: Level of anxiety will decrease Outcome: Progressing   Problem: Pain Managment: Goal: General experience of comfort will improve Outcome: Progressing   Problem: Safety: Goal: Ability to remain free from injury will improve Outcome: Progressing   Problem: Skin Integrity: Goal: Risk for impaired skin integrity will decrease Outcome: Progressing

## 2020-05-28 LAB — COMPREHENSIVE METABOLIC PANEL
ALT: 23 U/L (ref 0–44)
AST: 35 U/L (ref 15–41)
Albumin: 1 g/dL — ABNORMAL LOW (ref 3.5–5.0)
Alkaline Phosphatase: 75 U/L (ref 38–126)
Anion gap: 12 (ref 5–15)
BUN: 38 mg/dL — ABNORMAL HIGH (ref 6–20)
CO2: 23 mmol/L (ref 22–32)
Calcium: 7.5 mg/dL — ABNORMAL LOW (ref 8.9–10.3)
Chloride: 97 mmol/L — ABNORMAL LOW (ref 98–111)
Creatinine, Ser: 1.72 mg/dL — ABNORMAL HIGH (ref 0.61–1.24)
GFR calc non Af Amer: 53 mL/min — ABNORMAL LOW (ref >60)
Glucose, Bld: 92 mg/dL (ref 70–99)
Potassium: 4.4 mmol/L (ref 3.5–5.1)
Sodium: 132 mmol/L — ABNORMAL LOW (ref 135–145)
Total Bilirubin: 0.4 mg/dL (ref 0.3–1.2)
Total Protein: 4.9 g/dL — ABNORMAL LOW (ref 6.5–8.1)

## 2020-05-28 LAB — CBC
HCT: 25.7 % — ABNORMAL LOW (ref 39.0–52.0)
Hemoglobin: 8.3 g/dL — ABNORMAL LOW (ref 13.0–17.0)
MCH: 27.8 pg (ref 26.0–34.0)
MCHC: 32.3 g/dL (ref 30.0–36.0)
MCV: 86 fL (ref 80.0–100.0)
Platelets: 572 10*3/uL — ABNORMAL HIGH (ref 150–400)
RBC: 2.99 MIL/uL — ABNORMAL LOW (ref 4.22–5.81)
RDW: 13.1 % (ref 11.5–15.5)
WBC: 15.9 10*3/uL — ABNORMAL HIGH (ref 4.0–10.5)
nRBC: 0 % (ref 0.0–0.2)

## 2020-05-28 LAB — MAGNESIUM: Magnesium: 1.8 mg/dL (ref 1.7–2.4)

## 2020-05-28 MED ORDER — HYDROXYZINE HCL 25 MG PO TABS
25.0000 mg | ORAL_TABLET | Freq: Three times a day (TID) | ORAL | Status: DC | PRN
  Administered 2020-05-28 – 2020-06-29 (×15): 25 mg via ORAL
  Filled 2020-05-28 (×16): qty 1

## 2020-05-28 MED ORDER — MAGNESIUM SULFATE 2 GM/50ML IV SOLN
2.0000 g | Freq: Once | INTRAVENOUS | Status: AC
  Administered 2020-05-28: 2 g via INTRAVENOUS
  Filled 2020-05-28: qty 50

## 2020-05-28 MED ORDER — PAROXETINE HCL 10 MG PO TABS
10.0000 mg | ORAL_TABLET | Freq: Every day | ORAL | Status: DC
  Administered 2020-05-28 – 2020-06-03 (×7): 10 mg via ORAL
  Filled 2020-05-28 (×7): qty 1

## 2020-05-28 MED ORDER — SALINE SPRAY 0.65 % NA SOLN
1.0000 | NASAL | Status: DC | PRN
  Filled 2020-05-28: qty 44
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
## 2020-05-28 NOTE — Progress Notes (Signed)
   05/28/20 0909  Assess: MEWS Score  Temp 98.5 F (36.9 C)  BP 103/67  Pulse Rate 100  Resp (!) 35  Level of Consciousness Alert  SpO2 100 %  O2 Device Non-rebreather Mask;HFNC (heated)  O2 Flow Rate (L/min) 25 L/min  Assess: MEWS Score  MEWS Temp 0  MEWS Systolic 0  MEWS Pulse 0  MEWS RR 2  MEWS LOC 0  MEWS Score 2  MEWS Score Color Yellow  Assess: if the MEWS score is Yellow or Red  Were vital signs taken at a resting state? Yes  Focused Assessment No change from prior assessment  Early Detection of Sepsis Score *See Row Information* Low  MEWS guidelines implemented *See Row Information* No, previously yellow, continue vital signs every 4 hours  Treat  MEWS Interventions Administered scheduled meds/treatments  Pain Scale 0-10  Pain Score 10  Pain Location Generalized  Pain Intervention(s) Medication (See eMAR)  Neuro symptoms relieved by Rest

## 2020-05-28 NOTE — Progress Notes (Signed)
    [facility]   Reason for visit: Follow up on cellulitis and myofasciitis  Interval History: events overnight noted, now on NRB.  Up in chair.  No new complaints.   WBC stable at 15.9 Afebrile Day 19 total antibiotics Day 12 daptomycin at 10 mg/kg  Physical Exam: Constitutional:  Vitals:   05/28/20 0902 05/28/20 0909  BP:  103/67  Pulse: (!) 105 100  Resp: (!) 38 (!) 35  Temp:    SpO2: 100% 100%   patient appears in NAD Respiratory: increased RR, on NRB.   Cardiovascular: RRR GI: soft, nt, nd MS: moving his upper extremities without difficulty and able to prop his body up in the chair with his arms  Review of Systems: Constitutional: negative for fevers and chills Gastrointestinal: negative for nausea and diarrhea  Lab Results  Component Value Date   WBC 15.9 (H) 05/28/2020   HGB 8.3 (L) 05/28/2020   HCT 25.7 (L) 05/28/2020   MCV 86.0 05/28/2020   PLT 572 (H) 05/28/2020    Lab Results  Component Value Date   CREATININE 1.72 (H) 05/28/2020   BUN 38 (H) 05/28/2020   NA 132 (L) 05/28/2020   K 4.4 05/28/2020   CL 97 (L) 05/28/2020   CO2 23 05/28/2020    Lab Results  Component Value Date   ALT 23 05/28/2020   AST 35 05/28/2020   ALKPHOS 75 05/28/2020     Microbiology: Recent Results (from the past 240 hour(s))  Body fluid culture (includes gram stain)     Status: None   Collection Time: 05/19/20  2:16 PM   Specimen: Pleural Fluid  Result Value Ref Range Status   Specimen Description FLUID PLEURAL RIGHT  Final   Special Requests NONE  Final   Gram Stain NO WBC SEEN NO ORGANISMS SEEN   Final   Culture   Final    NO GROWTH 3 DAYS Performed at [facility], [address], [place], [place] [address]    Report Status 05/23/2020 FINAL  Final    Impression/Plan:  1. Extensive cellulitis with myofasciitis - overall continued improvement and on appropriate antibiotics. Will continue.  With daptomycin.    2.  Bacteremia -  MRSA and on treatment as above.    3. Respiratory failure - on oxygen and getting lasix.

## 2020-05-28 NOTE — Progress Notes (Signed)
° °   05/28/20 1620  Vitals  BP 120/71  MAP (mmHg) 80  BP Location Left Arm  BP Method Automatic  Patient Position (if appropriate) Lying  Pulse Rate (!) 110  Pulse Rate Source Monitor  Resp (!) 30  Oxygen Therapy  SpO2 100 %  O2 Device Non-rebreather Mask

## 2020-05-28 NOTE — Therapy (Signed)
P assessed and weaned to 25L and 90% HHFNC. Will continue to monitor and wean as tolerated.

## 2020-05-28 NOTE — Therapy (Deleted)
Pt refused cpap hospital unit no unit currently in room 
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
## 2020-05-29 ENCOUNTER — Inpatient Hospital Stay (HOSPITAL_COMMUNITY): Payer: Self-pay

## 2020-05-29 LAB — URINALYSIS, ROUTINE W REFLEX MICROSCOPIC
Specific Gravity, Urine: 1.01 (ref 1.005–1.030)
pH: 5.5 (ref 5.0–8.0)

## 2020-05-29 LAB — COMPREHENSIVE METABOLIC PANEL
ALT: 21 U/L (ref 0–44)
AST: 36 U/L (ref 15–41)
Albumin: 1 g/dL — ABNORMAL LOW (ref 3.5–5.0)
Alkaline Phosphatase: 81 U/L (ref 38–126)
Anion gap: 9 (ref 5–15)
BUN: 38 mg/dL — ABNORMAL HIGH (ref 6–20)
CO2: 24 mmol/L (ref 22–32)
Calcium: 7.6 mg/dL — ABNORMAL LOW (ref 8.9–10.3)
Chloride: 100 mmol/L (ref 98–111)
Creatinine, Ser: 1.65 mg/dL — ABNORMAL HIGH (ref 0.61–1.24)
GFR calc non Af Amer: 55 mL/min — ABNORMAL LOW (ref >60)
Glucose, Bld: 92 mg/dL (ref 70–99)
Potassium: 4.1 mmol/L (ref 3.5–5.1)
Sodium: 133 mmol/L — ABNORMAL LOW (ref 135–145)
Total Bilirubin: 0.6 mg/dL (ref 0.3–1.2)
Total Protein: 4.9 g/dL — ABNORMAL LOW (ref 6.5–8.1)

## 2020-05-29 LAB — BLOOD GAS, ARTERIAL
Acid-Base Excess: 1.4 mmol/L (ref 0.0–2.0)
Bicarbonate: 25.5 mmol/L (ref 20.0–28.0)
Drawn by: 60057
FIO2: 100
O2 Saturation: 98.9 %
Patient temperature: 37
pCO2 arterial: 40.6 mmHg (ref 32.0–48.0)
pH, Arterial: 7.414 (ref 7.350–7.450)
pO2, Arterial: 140 mmHg — ABNORMAL HIGH (ref 83.0–108.0)

## 2020-05-29 LAB — CBC
HCT: 25.1 % — ABNORMAL LOW (ref 39.0–52.0)
Hemoglobin: 8.1 g/dL — ABNORMAL LOW (ref 13.0–17.0)
MCH: 27.6 pg (ref 26.0–34.0)
MCHC: 32.3 g/dL (ref 30.0–36.0)
MCV: 85.4 fL (ref 80.0–100.0)
Platelets: 650 10*3/uL — ABNORMAL HIGH (ref 150–400)
RBC: 2.94 MIL/uL — ABNORMAL LOW (ref 4.22–5.81)
RDW: 13.2 % (ref 11.5–15.5)
WBC: 16.6 10*3/uL — ABNORMAL HIGH (ref 4.0–10.5)
nRBC: 0 % (ref 0.0–0.2)

## 2020-05-29 LAB — URINALYSIS, MICROSCOPIC (REFLEX): RBC / HPF: >50 RBC/hpf (ref 0–5)

## 2020-05-29 IMAGING — CR DG CHEST 2V
2 series · 2 of 2 positions shown · non-contrast
Comparison: [DATE]

CLINICAL DATA: Weakness, shortness of breath

EXAM:
CHEST - 2 VIEW

[chest lat]
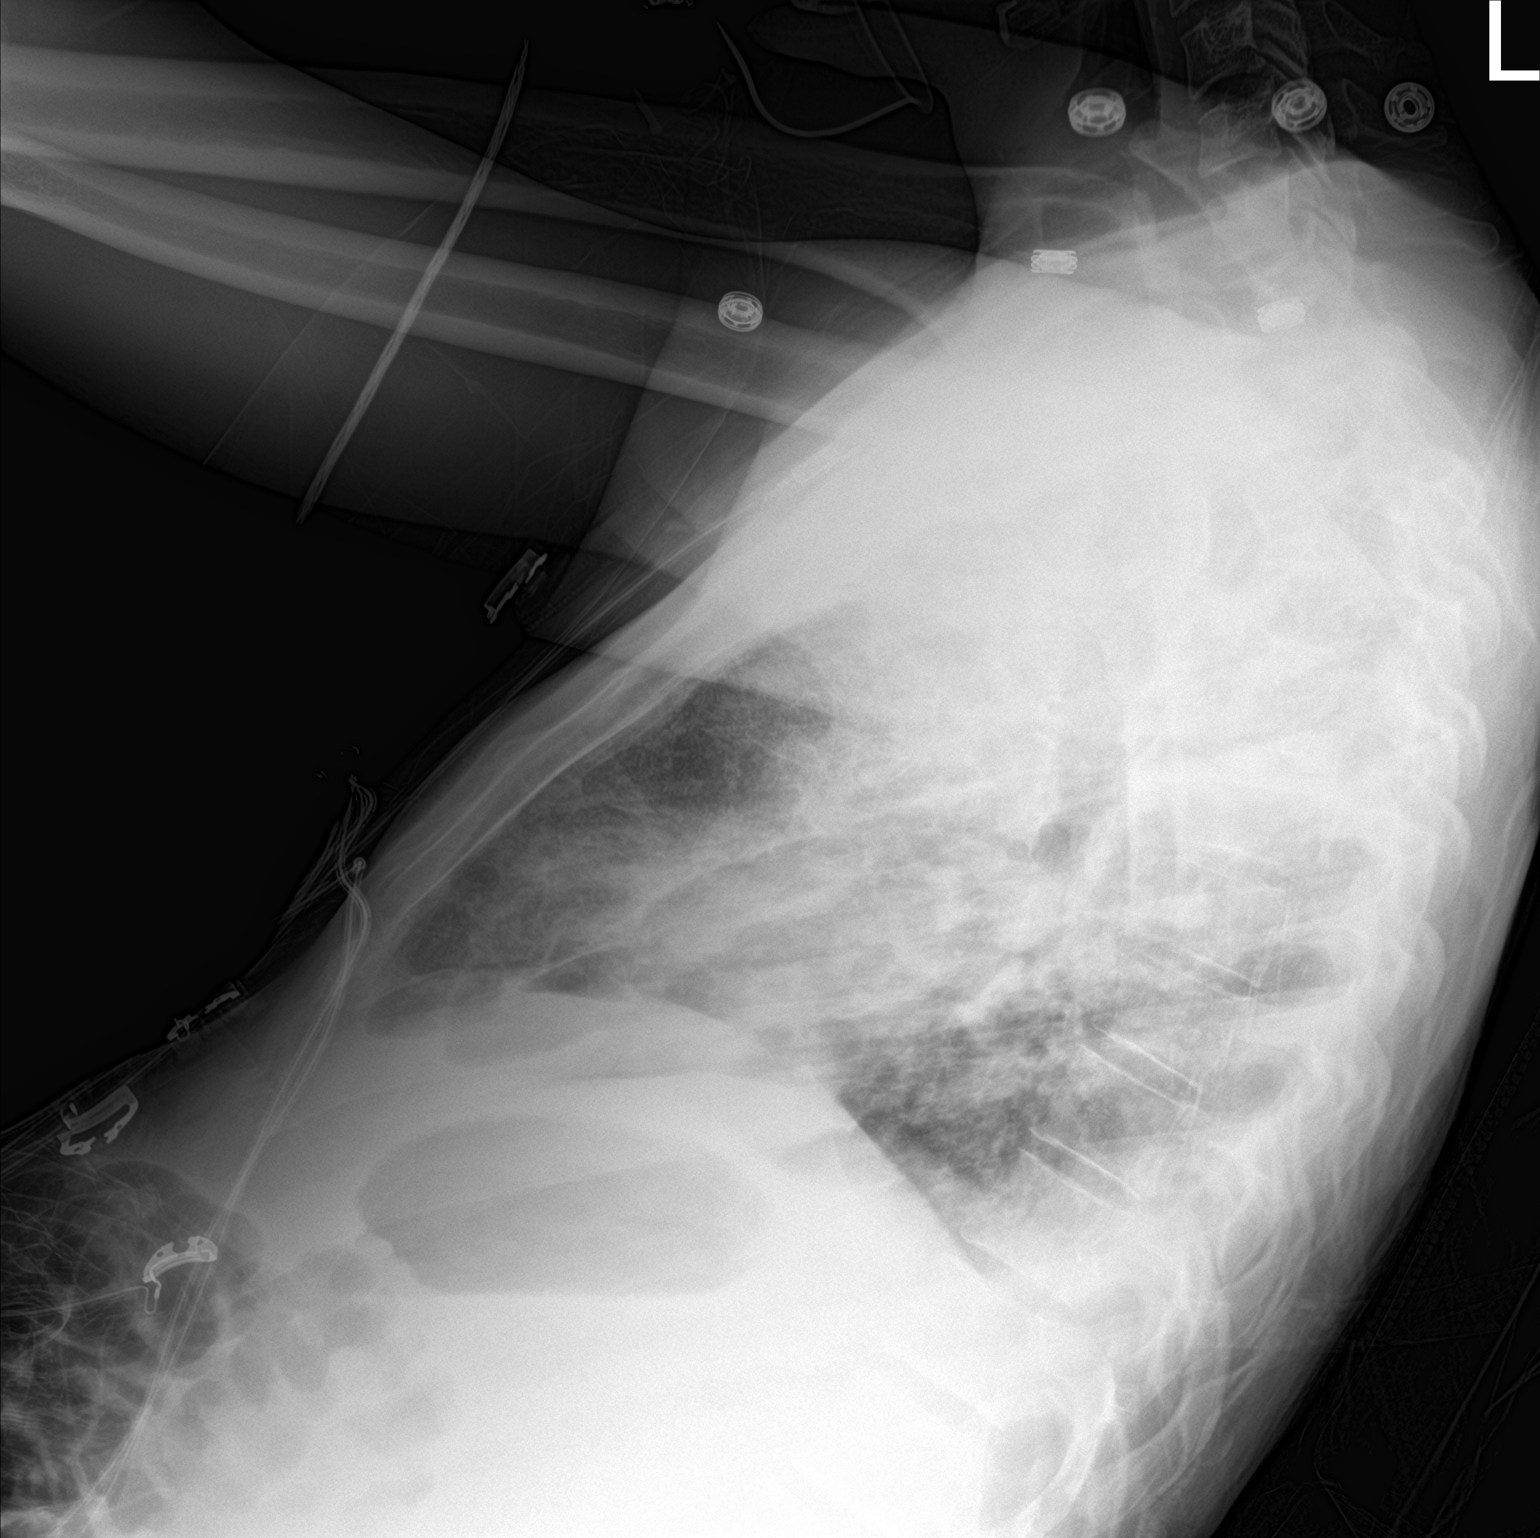

[chest ap]
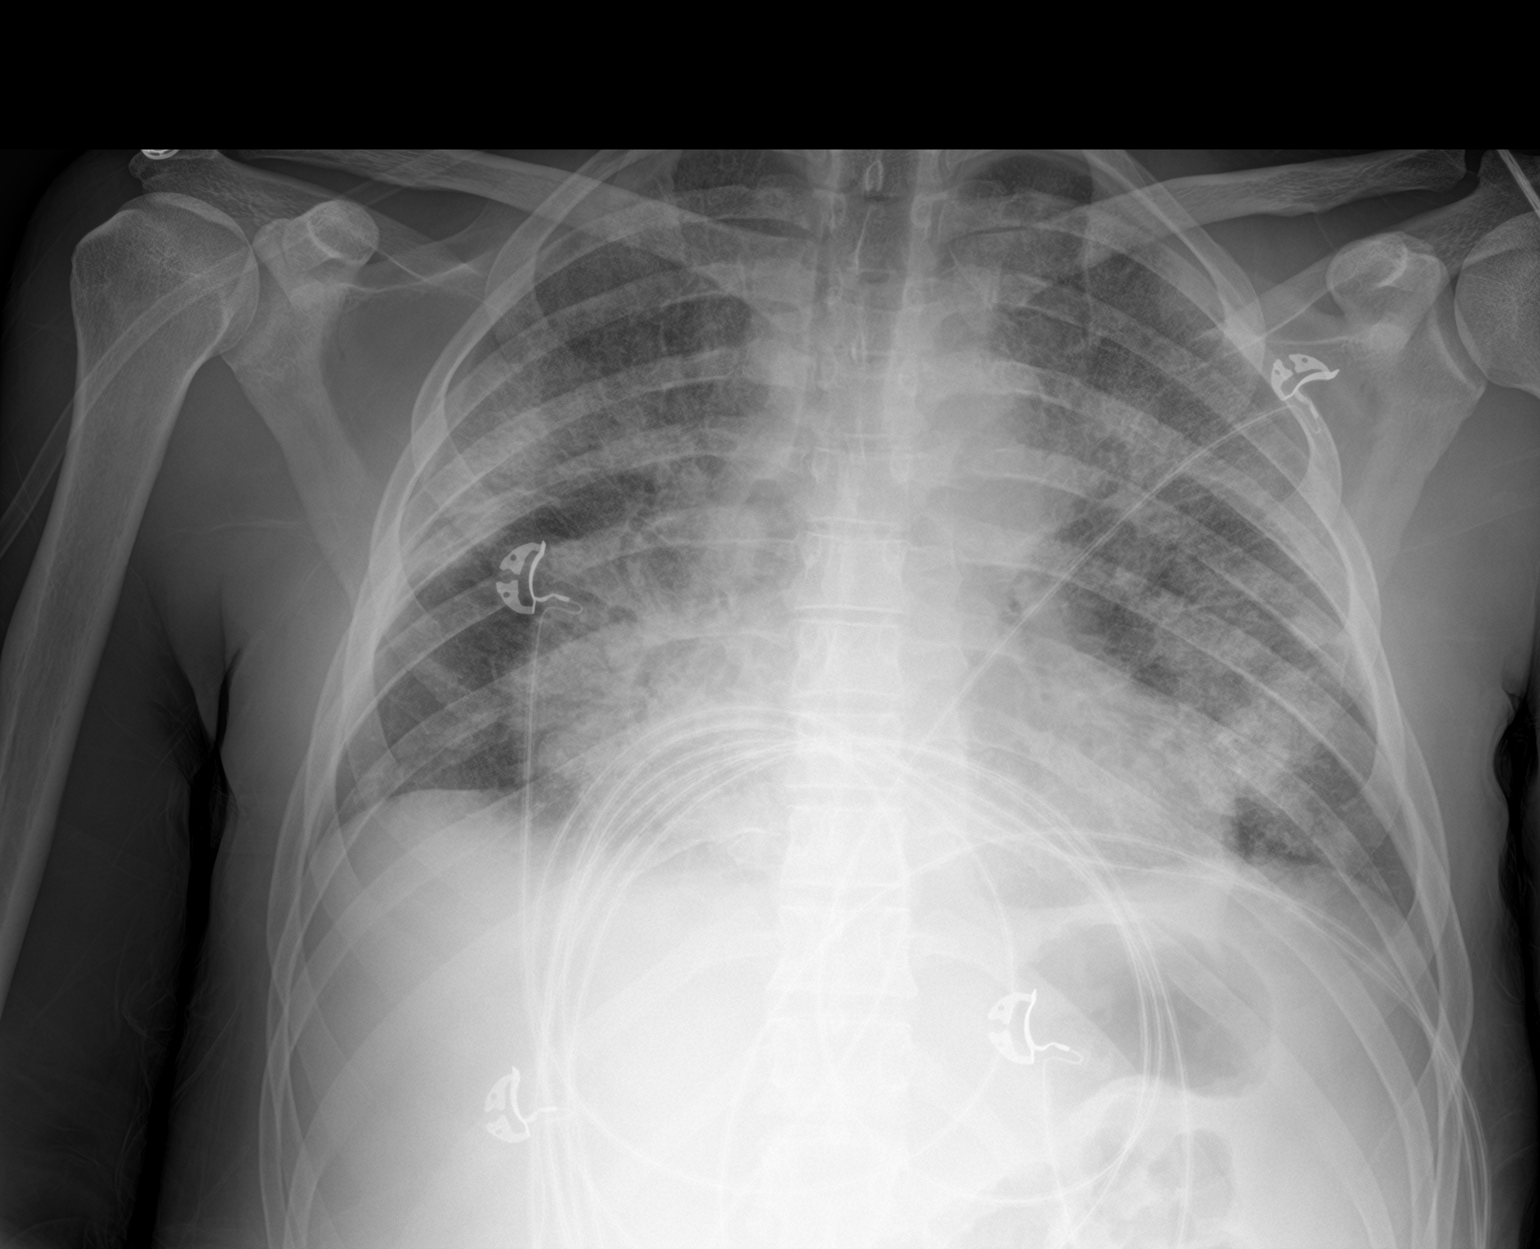

[2 of 2 positions shown; findings below may reference images not displayed]

FINDINGS: No significant interval change in chest radiographs with extensive
bilateral heterogeneous and interstitial airspace opacity and small
bilateral pleural effusions. The heart and mediastinum are
unremarkable. The osseous structures are unremarkable.
IMPRESSION: No significant interval change in chest radiographs with extensive
bilateral heterogeneous and interstitial airspace opacity and small
bilateral pleural effusions, consistent with multifocal infection
and/or edema.

## 2020-05-29 MED ORDER — FUROSEMIDE 10 MG/ML IJ SOLN
20.0000 mg | Freq: Once | INTRAMUSCULAR | Status: AC
  Administered 2020-05-29: 20 mg via INTRAVENOUS
  Filled 2020-05-29: qty 2

## 2020-05-29 NOTE — Progress Notes (Signed)
   05/29/20 0000  Assess: MEWS Score  Temp 98.9 F (37.2 C)  BP 136/76  Pulse Rate (!) 108  ECG Heart Rate (!) 109  Resp (!) 41  SpO2 98 %  O2 Device Non-rebreather Mask  Assess: MEWS Score  MEWS Temp 0  MEWS Systolic 0  MEWS Pulse 1  MEWS RR 3  MEWS LOC 0  MEWS Score 4  MEWS Score Color Red  Assess: if the MEWS score is Yellow or Red  Were vital signs taken at a resting state? Yes (ongoing pain)  Focused Assessment No change from prior assessment  Early Detection of Sepsis Score *See Row Information* Low  MEWS guidelines implemented *See Row Information* Yes  Treat  MEWS Interventions Administered prn meds/treatments;Escalated (See documentation below);Consulted Respiratory Therapy  Pain Scale 0-10  Pain Score 10  Take Vital Signs  Increase Vital Sign Frequency  Red: Q 1hr X 4 then Q 4hr X 4, if remains red, continue Q 4hrs  Escalate  MEWS: Escalate Red: discuss with charge nurse/RN and provider, consider discussing with RRT  Notify: Charge Nurse/RN  Name of Charge Nurse/RN Notified Jequetta RN  Date Charge Nurse/RN Notified 05/28/20  Time Charge Nurse/RN Notified 2335  Notify: Rapid Response  Name of Rapid Response RN Notified David RN  Date Rapid Response Notified 05/28/20  Time Rapid Response Notified 2337  Document  Patient Outcome Not stable and remains on department  Progress note created (see row info) Yes
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
## 2020-05-30 LAB — BASIC METABOLIC PANEL
Anion gap: 11 (ref 5–15)
BUN: 39 mg/dL — ABNORMAL HIGH (ref 6–20)
CO2: 23 mmol/L (ref 22–32)
Calcium: 7.8 mg/dL — ABNORMAL LOW (ref 8.9–10.3)
Chloride: 98 mmol/L (ref 98–111)
Creatinine, Ser: 1.68 mg/dL — ABNORMAL HIGH (ref 0.61–1.24)
GFR calc non Af Amer: 54 mL/min — ABNORMAL LOW (ref >60)
Glucose, Bld: 99 mg/dL (ref 70–99)
Potassium: 3.9 mmol/L (ref 3.5–5.1)
Sodium: 132 mmol/L — ABNORMAL LOW (ref 135–145)

## 2020-05-30 LAB — CBC
HCT: 27.7 % — ABNORMAL LOW (ref 39.0–52.0)
Hemoglobin: 8.7 g/dL — ABNORMAL LOW (ref 13.0–17.0)
MCH: 26.7 pg (ref 26.0–34.0)
MCHC: 31.4 g/dL (ref 30.0–36.0)
MCV: 85 fL (ref 80.0–100.0)
Platelets: 752 10*3/uL — ABNORMAL HIGH (ref 150–400)
RBC: 3.26 MIL/uL — ABNORMAL LOW (ref 4.22–5.81)
RDW: 13 % (ref 11.5–15.5)
WBC: 17.8 10*3/uL — ABNORMAL HIGH (ref 4.0–10.5)
nRBC: 0 % (ref 0.0–0.2)

## 2020-05-30 MED ORDER — SODIUM CHLORIDE 0.9 % IV SOLN
1.5000 g | Freq: Four times a day (QID) | INTRAVENOUS | Status: DC
  Administered 2020-05-30 – 2020-06-04 (×20): 1.5 g via INTRAVENOUS
  Filled 2020-05-30 (×7): qty 4
  Filled 2020-05-30: qty 1.5
  Filled 2020-05-30 (×6): qty 4
  Filled 2020-05-30: qty 1.5
  Filled 2020-05-30 (×6): qty 4
  Filled 2020-05-30: qty 1.5
  Filled 2020-05-30: qty 4
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
## 2020-05-30 NOTE — Progress Notes (Signed)
    [facility]   Reason for visit: Follow up on cellulitis and myofasciitis  Interval History: he remains on HF O2; + cough with productive sputum.  Does not feel he can participate with PT today.   Day 20 total antibiotics Day 13 daptomycin at 10 mg/kg  Physical Exam: Constitutional:  Vitals:   05/30/20 0700 05/30/20 0716  BP: 126/79   Pulse: 88 93  Resp:    Temp:    SpO2: 100% 97%   patient appears in NAD Respiratory: increased RR, on HF Cardiovascular: RRR GI: soft, nt, nd   Review of Systems: Constitutional: negative for fevers and chills Gastrointestinal: negative for nausea and diarrhea  Lab Results  Component Value Date   WBC 17.8 (H) 05/30/2020   HGB 8.7 (L) 05/30/2020   HCT 27.7 (L) 05/30/2020   MCV 85.0 05/30/2020   PLT 752 (H) 05/30/2020    Lab Results  Component Value Date   CREATININE 1.68 (H) 05/30/2020   BUN 39 (H) 05/30/2020   NA 132 (L) 05/30/2020   K 3.9 05/30/2020   CL 98 05/30/2020   CO2 23 05/30/2020    Lab Results  Component Value Date   ALT 21 05/29/2020   AST 36 05/29/2020   ALKPHOS 81 05/29/2020     Microbiology: No results found for this or any previous visit (from the past 240 hour(s)).  Impression/Plan:  1. Extensive cellulitis with myofasciitis - on daptomycin and will continue with a prolonged course.   2.  Bacteremia - repeat cultures have remained negative.    3. Respiratory failure - he has received lasix but continues to have multifocal opacities on his CXR, some increase in his WBC and with continued oxygen needs.  ? If aspiration vs pneumonia.  Will add ampicillin/sulbactam.  I will check a sputum as well.    4.  Leukocytosis - as above, ? Pneumonia.  Starting antibiotics.

## 2020-05-30 NOTE — Progress Notes (Signed)
Upon RT assessment, pt noted to be mildly dyspneic.  Rales noted on LLL.  Pt complained of pain.    Made slow attempt to titrate flow, but pt began to complain of discomfort with increased flow (~15L up from 10L).  Pt verbally complained at length without exhibiting clinical shortness of breath.  Set flow back to 10L at pt's request.  Pt c/o pain, and RN was made aware.

## 2020-05-31 LAB — BASIC METABOLIC PANEL
Anion gap: 9 (ref 5–15)
BUN: 40 mg/dL — ABNORMAL HIGH (ref 6–20)
CO2: 24 mmol/L (ref 22–32)
Calcium: 7.6 mg/dL — ABNORMAL LOW (ref 8.9–10.3)
Chloride: 100 mmol/L (ref 98–111)
Creatinine, Ser: 1.78 mg/dL — ABNORMAL HIGH (ref 0.61–1.24)
GFR, Estimated: 50 mL/min — ABNORMAL LOW (ref >60)
Glucose, Bld: 113 mg/dL — ABNORMAL HIGH (ref 70–99)
Potassium: 3.9 mmol/L (ref 3.5–5.1)
Sodium: 133 mmol/L — ABNORMAL LOW (ref 135–145)

## 2020-05-31 LAB — CBC
HCT: 25.1 % — ABNORMAL LOW (ref 39.0–52.0)
Hemoglobin: 8 g/dL — ABNORMAL LOW (ref 13.0–17.0)
MCH: 27.1 pg (ref 26.0–34.0)
MCHC: 31.9 g/dL (ref 30.0–36.0)
MCV: 85.1 fL (ref 80.0–100.0)
Platelets: 651 10*3/uL — ABNORMAL HIGH (ref 150–400)
RBC: 2.95 MIL/uL — ABNORMAL LOW (ref 4.22–5.81)
RDW: 12.9 % (ref 11.5–15.5)
WBC: 13.4 10*3/uL — ABNORMAL HIGH (ref 4.0–10.5)
nRBC: 0 % (ref 0.0–0.2)

## 2020-05-31 LAB — T4, FREE: Free T4: 0.52 ng/dL — ABNORMAL LOW (ref 0.61–1.12)

## 2020-05-31 LAB — TSH: TSH: 5.582 u[IU]/mL — ABNORMAL HIGH (ref 0.350–4.500)

## 2020-05-31 NOTE — Progress Notes (Signed)
Pharmacy Antibiotic Note  [name] is a 29 y.o. male admitted on 05/10/2020 with  MRSA bacteremia (TEE neg), R shoulder septic arthritis, L knee diffuse cellulitis/abscess.  Pharmacy has been consulted for Daptomycin dosing.  MRSA bacteremia (TEE neg), R shoulder septic arthritis, L knee diffuse cellulitis/abscess - 9/25: MRI significantly worse celluliits/myofasciitis - increased dapto. - TTE 9/20with thin mobile density below the TV annulus. Unclear whether  this is a vegetation or eustacion valve.  - TEE 9/22 no vegetations. - Surg consult r/o nec fasc -- Hep C positive, Hep A Ab reactive, hep B negative  10/9 - ID added Unasyn for possible PNA on 10/8. WBC trending down now 13.4, afebrile. Remains on oxygen but improving overall.   Vanc x 1 on 9/18 CTX x 1 on 9/18 Zyvox  9/18>> 9/21 Dapto 9/21 >> (increased 9/25) >> Unasyn 10/8 >> Orita x1 >> (9/27) Cipro 9/26 >>9/28  9/21 CK 72 9/24 CK 24 9/25 CK 18 9/28:CK 14 10/5: CK 18  9/18 urine: negative 9/18 COVID: negative 9/18 BCx: 1/4 bottles w/ Staph aureus 9/18: BCID Staph, mecA detected 9/20 repeat BCx: negF 9/27 blood>>negF  Plan: -Continue Dapto 800 mg (10 mg/kg) IV q24h Day 21 -Continue Unasyn 1.5g every 6 hours  -Follow weekly CK, clinical progress and renal function   Height: 5\' 10"  (177.8 cm) Weight: 72 kg (158 lb 11.7 oz) IBW/kg (Calculated) : 73  Temp (24hrs), Avg:98.3 F (36.8 C), Min:98 F (36.7 C), Max:98.5 F (36.9 C)  Recent Labs  Lab 05/27/20 0628 05/28/20 0659 05/29/20 0548 05/30/20 0243 05/31/20 0306  WBC 15.4* 15.9* 16.6* 17.8* 13.4*  CREATININE 1.51* 1.72* 1.65* 1.68* 1.78*    Estimated Creatinine Clearance: 62.4 mL/min (A) (by C-G formula based on SCr of 1.78 mg/dL (H)).    No Known Allergies  07/31/20, PharmD PGY1 Acute Care Pharmacy Resident Please refer to [facility] for unit-specific pharmacist

## 2020-05-31 NOTE — Progress Notes (Signed)
    [facility]   Reason for visit: Follow up on MRSA bacteremia, possible pneumonia  Interval History: he remains on HF oxygen but has been reluctant to wean down.  He feels better overall, particularly with his breathing. Afebrile and WBC down to 13.4.   Day 2 ampicillin/sulbactam Day 14 daptomycin at high dose Day 21 total antibiotics   Physical Exam: Constitutional:  Vitals:   05/31/20 0400 05/31/20 0759  BP: 140/82   Pulse: 86   Resp:    Temp: 98 F (36.7 C)   SpO2: 100% 100%   patient appears in NAD Respiratory: Normal respiratory effort; CTA B Cardiovascular: RRR GI: soft, nt, nd  Review of Systems: Constitutional: negative for fevers, chills and anorexia Respiratory: less cough, less sob Gastrointestinal: negative for nausea and diarrhea  Lab Results  Component Value Date   WBC 13.4 (H) 05/31/2020   HGB 8.0 (L) 05/31/2020   HCT 25.1 (L) 05/31/2020   MCV 85.1 05/31/2020   PLT 651 (H) 05/31/2020    Lab Results  Component Value Date   CREATININE 1.78 (H) 05/31/2020   BUN 40 (H) 05/31/2020   NA 133 (L) 05/31/2020   K 3.9 05/31/2020   CL 100 05/31/2020   CO2 24 05/31/2020    Lab Results  Component Value Date   ALT 21 05/29/2020   AST 36 05/29/2020   ALKPHOS 81 05/29/2020     Microbiology: No results found for this or any previous visit (from the past 240 hour(s)).  Impression/Plan:  1. Pulmonary opacities - pulmonary edema vs pneumonia.  Some improvement on amp/sulbactam.  May have had some element of aspiration.  WBC improved. Will conitnue with amp/sulbactam  2.  MRSA bacteremia - disseminated infection though negative TEE.  Slow, continued improvement.    3.  Renal failure - stable creat at this time.  Off of vancomycin as above.
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
## 2020-06-01 LAB — BASIC METABOLIC PANEL
Anion gap: 8 (ref 5–15)
BUN: 38 mg/dL — ABNORMAL HIGH (ref 6–20)
CO2: 25 mmol/L (ref 22–32)
Calcium: 7.7 mg/dL — ABNORMAL LOW (ref 8.9–10.3)
Chloride: 102 mmol/L (ref 98–111)
Creatinine, Ser: 1.73 mg/dL — ABNORMAL HIGH (ref 0.61–1.24)
GFR, Estimated: 52 mL/min — ABNORMAL LOW (ref >60)
Glucose, Bld: 91 mg/dL (ref 70–99)
Potassium: 4.1 mmol/L (ref 3.5–5.1)
Sodium: 135 mmol/L (ref 135–145)

## 2020-06-01 LAB — CBC
HCT: 26.2 % — ABNORMAL LOW (ref 39.0–52.0)
Hemoglobin: 8.3 g/dL — ABNORMAL LOW (ref 13.0–17.0)
MCH: 27.2 pg (ref 26.0–34.0)
MCHC: 31.7 g/dL (ref 30.0–36.0)
MCV: 85.9 fL (ref 80.0–100.0)
Platelets: 678 10*3/uL — ABNORMAL HIGH (ref 150–400)
RBC: 3.05 MIL/uL — ABNORMAL LOW (ref 4.22–5.81)
RDW: 13.1 % (ref 11.5–15.5)
WBC: 14.3 10*3/uL — ABNORMAL HIGH (ref 4.0–10.5)
nRBC: 0 % (ref 0.0–0.2)

## 2020-06-01 NOTE — Progress Notes (Signed)
Changed oxygen delivery device from heated high flow to 4L [place]ter, SATs 98%
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
## 2020-06-02 ENCOUNTER — Inpatient Hospital Stay (HOSPITAL_COMMUNITY): Payer: Self-pay

## 2020-06-02 DIAGNOSIS — Z9889 Other specified postprocedural states: Secondary | ICD-10-CM

## 2020-06-02 DIAGNOSIS — N179 Acute kidney failure, unspecified: Secondary | ICD-10-CM

## 2020-06-02 DIAGNOSIS — R7881 Bacteremia: Secondary | ICD-10-CM

## 2020-06-02 DIAGNOSIS — F1990 Other psychoactive substance use, unspecified, uncomplicated: Secondary | ICD-10-CM

## 2020-06-02 DIAGNOSIS — J90 Pleural effusion, not elsewhere classified: Secondary | ICD-10-CM

## 2020-06-02 LAB — COMPREHENSIVE METABOLIC PANEL
ALT: 29 U/L (ref 0–44)
AST: 35 U/L (ref 15–41)
Albumin: 1.2 g/dL — ABNORMAL LOW (ref 3.5–5.0)
Alkaline Phosphatase: 57 U/L (ref 38–126)
Anion gap: 10 (ref 5–15)
BUN: 38 mg/dL — ABNORMAL HIGH (ref 6–20)
CO2: 23 mmol/L (ref 22–32)
Calcium: 7.8 mg/dL — ABNORMAL LOW (ref 8.9–10.3)
Chloride: 103 mmol/L (ref 98–111)
Creatinine, Ser: 1.95 mg/dL — ABNORMAL HIGH (ref 0.61–1.24)
GFR, Estimated: 45 mL/min — ABNORMAL LOW (ref >60)
Glucose, Bld: 99 mg/dL (ref 70–99)
Potassium: 4.3 mmol/L (ref 3.5–5.1)
Sodium: 136 mmol/L (ref 135–145)
Total Bilirubin: 0.3 mg/dL (ref 0.3–1.2)
Total Protein: 5.3 g/dL — ABNORMAL LOW (ref 6.5–8.1)

## 2020-06-02 LAB — CBC
HCT: 25 % — ABNORMAL LOW (ref 39.0–52.0)
Hemoglobin: 8 g/dL — ABNORMAL LOW (ref 13.0–17.0)
MCH: 27.4 pg (ref 26.0–34.0)
MCHC: 32 g/dL (ref 30.0–36.0)
MCV: 85.6 fL (ref 80.0–100.0)
Platelets: 681 10*3/uL — ABNORMAL HIGH (ref 150–400)
RBC: 2.92 MIL/uL — ABNORMAL LOW (ref 4.22–5.81)
RDW: 13.1 % (ref 11.5–15.5)
WBC: 14.8 10*3/uL — ABNORMAL HIGH (ref 4.0–10.5)
nRBC: 0 % (ref 0.0–0.2)

## 2020-06-02 LAB — ECHOCARDIOGRAM LIMITED
Height: 70 in
Weight: 2504.43 oz

## 2020-06-02 MED ORDER — OXYMETAZOLINE HCL 0.05 % NA SOLN
1.0000 | Freq: Two times a day (BID) | NASAL | Status: DC | PRN
  Administered 2020-06-02 – 2020-06-07 (×4): 1 via NASAL
  Filled 2020-06-02: qty 30

## 2020-06-02 MED ORDER — AYR SALINE NASAL NA GEL
1.0000 "application " | Freq: Four times a day (QID) | NASAL | Status: DC | PRN
  Administered 2020-06-02 – 2020-06-03 (×2): 1 via NASAL
  Filled 2020-06-02 (×2): qty 14.1

## 2020-06-02 NOTE — Plan of Care (Signed)
?  Problem: Clinical Measurements: ?Goal: Will remain free from infection ?Outcome: Progressing ?  ?

## 2020-06-02 NOTE — Progress Notes (Signed)
Echocardiogram 2D Echocardiogram has been performed.  [name] 06/02/2020, 10:45 AM

## 2020-06-02 NOTE — Progress Notes (Signed)
Tried to get Pt down for MRI. He refused scan for today but states he will try tomorrow.
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
## 2020-06-03 ENCOUNTER — Inpatient Hospital Stay (HOSPITAL_COMMUNITY): Payer: Self-pay

## 2020-06-03 DIAGNOSIS — R0781 Pleurodynia: Secondary | ICD-10-CM

## 2020-06-03 DIAGNOSIS — M609 Myositis, unspecified: Secondary | ICD-10-CM

## 2020-06-03 DIAGNOSIS — M60039 Infective myositis, unspecified forearm: Secondary | ICD-10-CM

## 2020-06-03 LAB — BASIC METABOLIC PANEL
Anion gap: 10 (ref 5–15)
BUN: 31 mg/dL — ABNORMAL HIGH (ref 6–20)
CO2: 23 mmol/L (ref 22–32)
Calcium: 7.7 mg/dL — ABNORMAL LOW (ref 8.9–10.3)
Chloride: 102 mmol/L (ref 98–111)
Creatinine, Ser: 1.82 mg/dL — ABNORMAL HIGH (ref 0.61–1.24)
GFR, Estimated: 49 mL/min — ABNORMAL LOW (ref >60)
Glucose, Bld: 92 mg/dL (ref 70–99)
Potassium: 4.5 mmol/L (ref 3.5–5.1)
Sodium: 135 mmol/L (ref 135–145)

## 2020-06-03 LAB — CK: Total CK: 12 U/L — ABNORMAL LOW (ref 49–397)

## 2020-06-03 IMAGING — US US EXTREM UP*L* LTD
1 series · 14 of 17 positions shown · non-contrast
Comparison: None.

CLINICAL DATA: Forearm pain, swelling, cellulitis

EXAM:
ULTRASOUND LEFT UPPER EXTREMITY LIMITED
TECHNIQUE: Ultrasound examination of the upper extremity soft tissues was
performed in the area of clinical concern.

[Series 1: us soft tissue upper extremity limited left (non-v · 17 acquisitions, 14 frames shown]
[im 1/17]
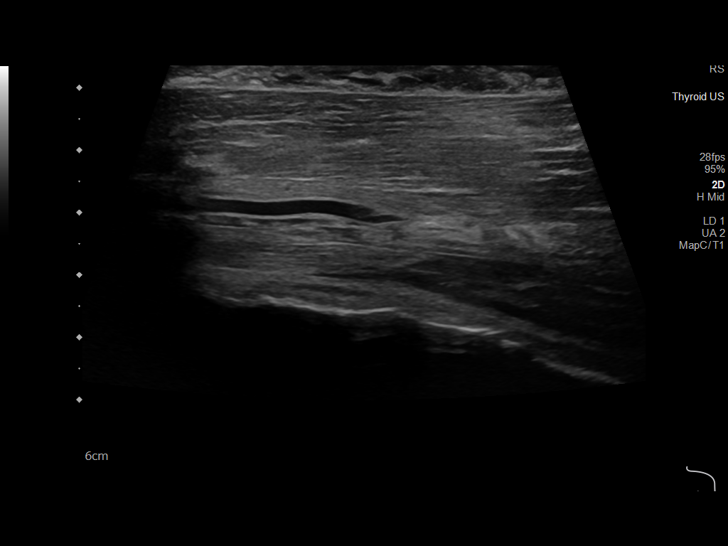
[im 2/17]
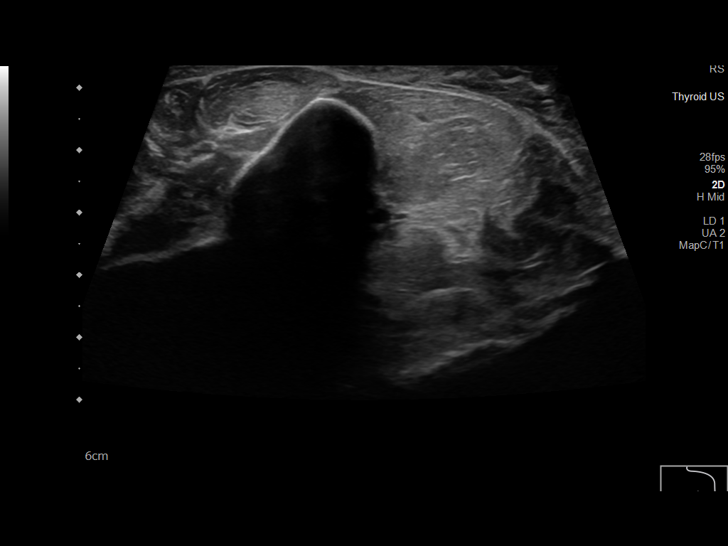
[im 4/17]
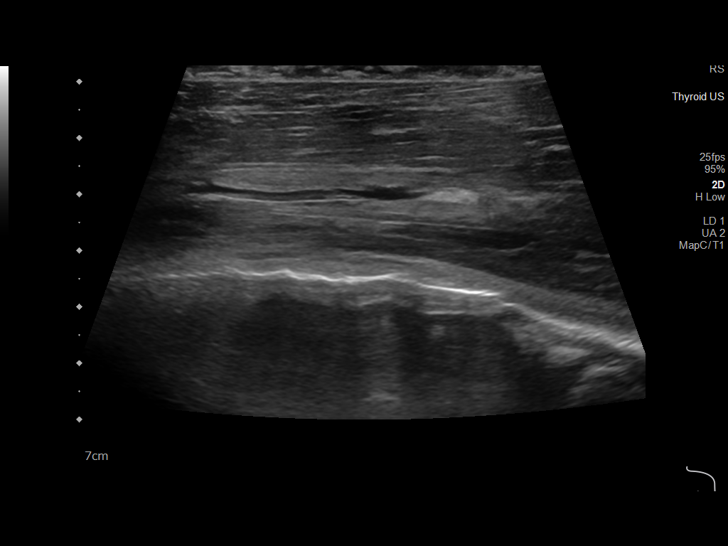
[im 5/17]
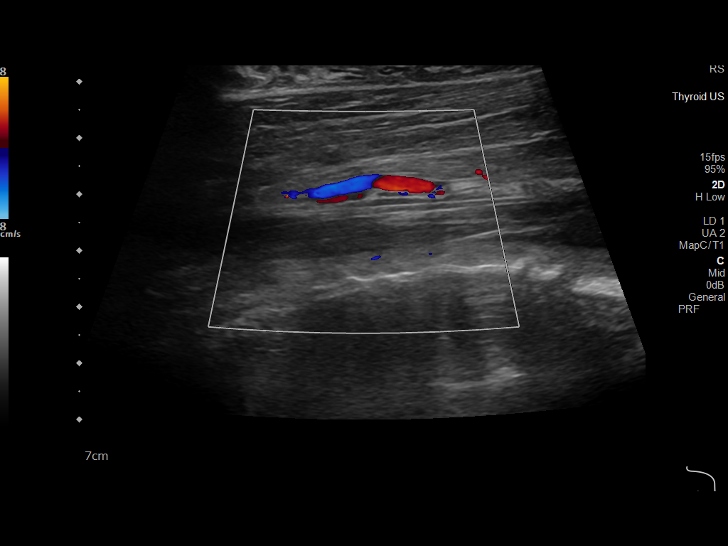
[im 6/17]
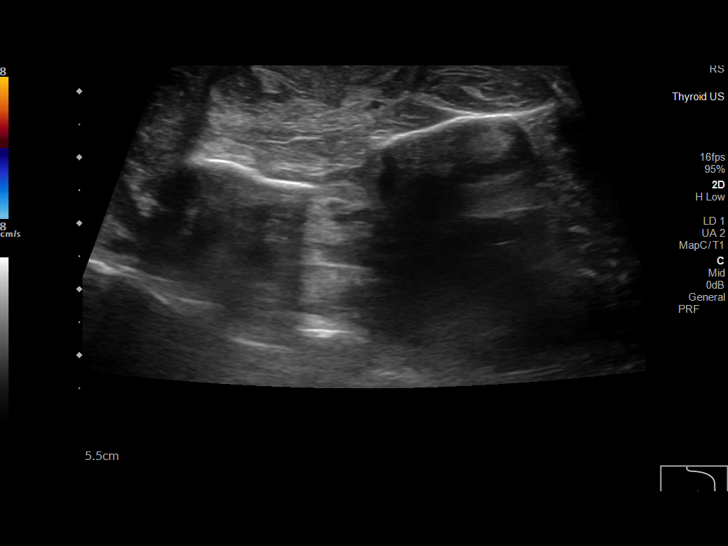
[im 7/17]
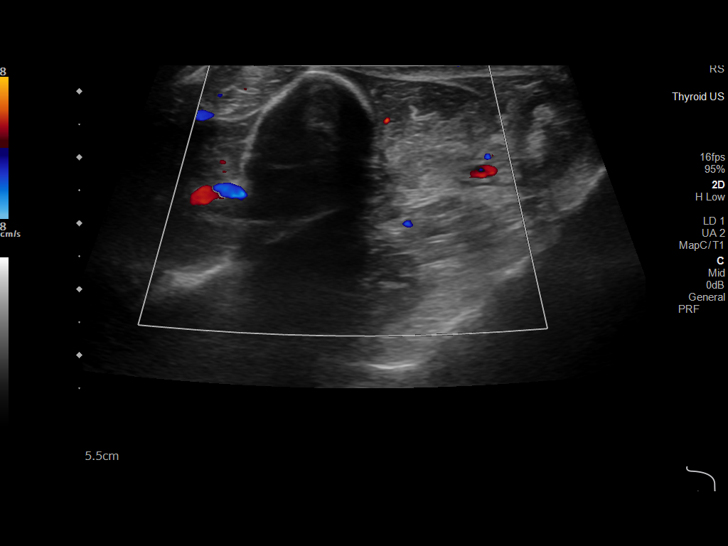
[im 8/17]
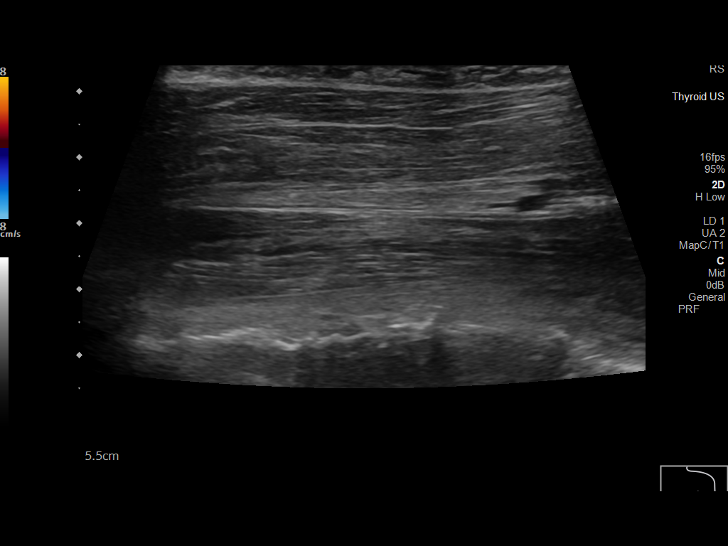
[im 10/17]
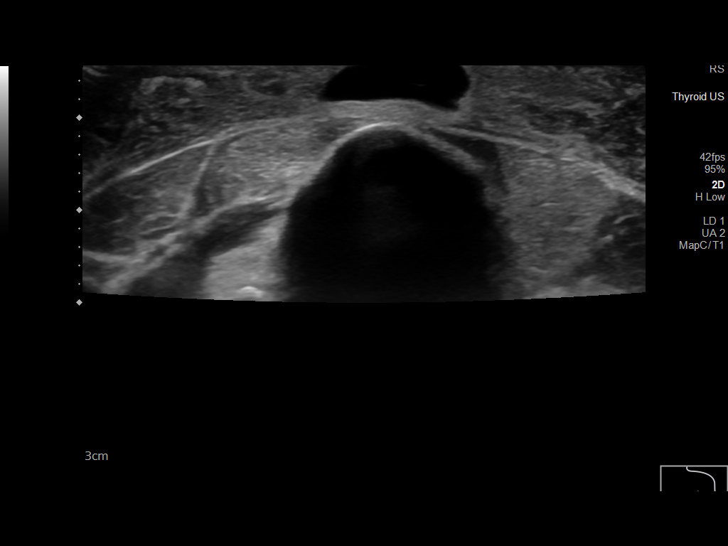
[im 11/17]
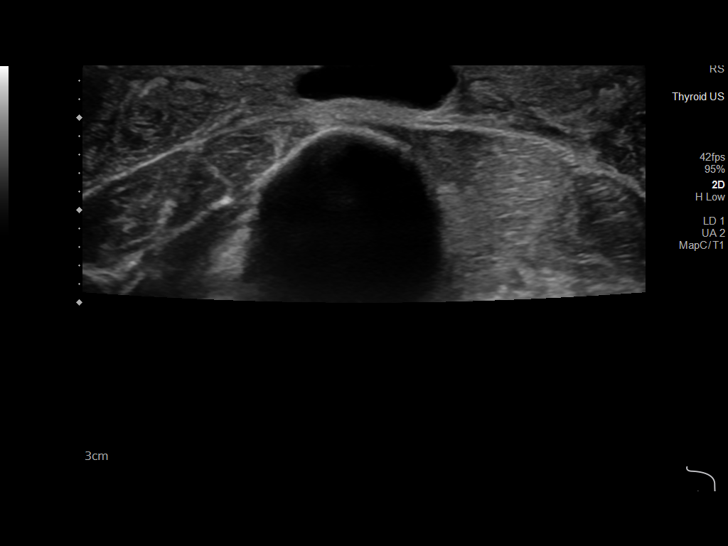
[im 12/17]
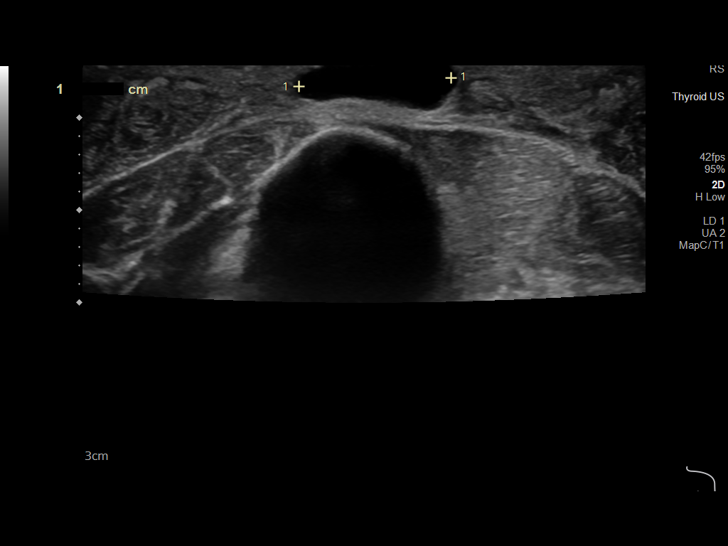
[im 13/17]
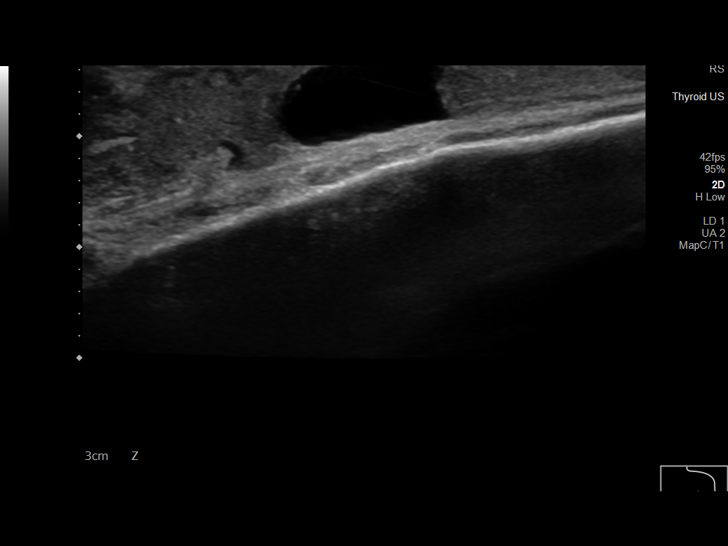
[im 14/17]
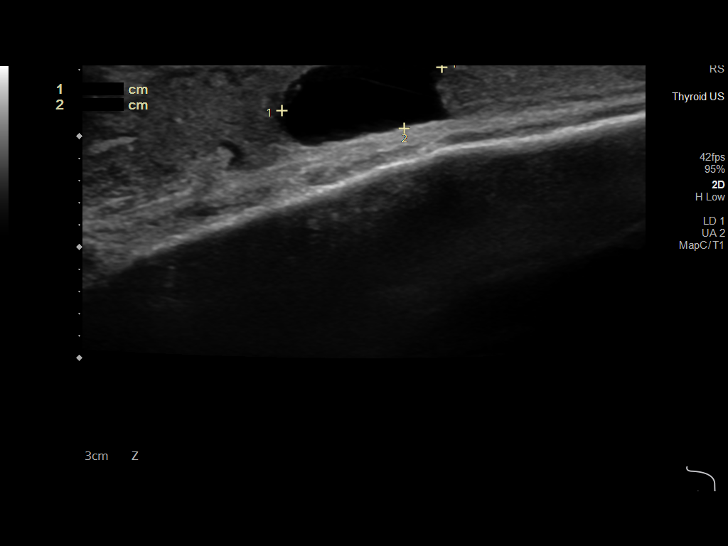
[im 16/17]
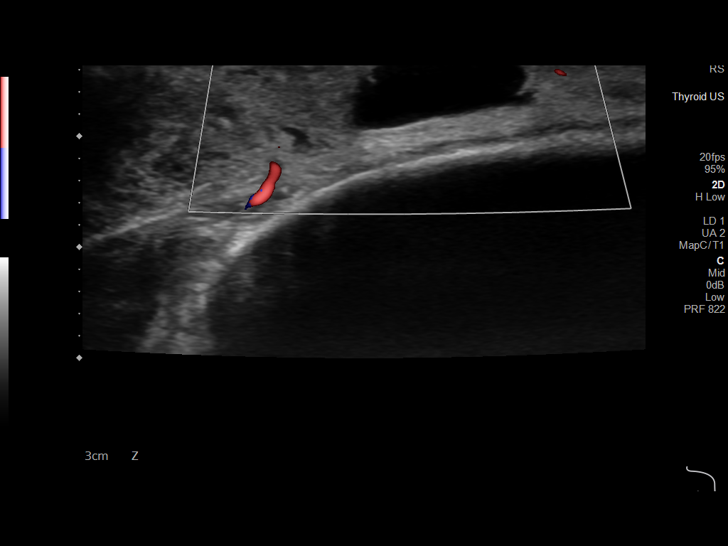
[im 17/17]
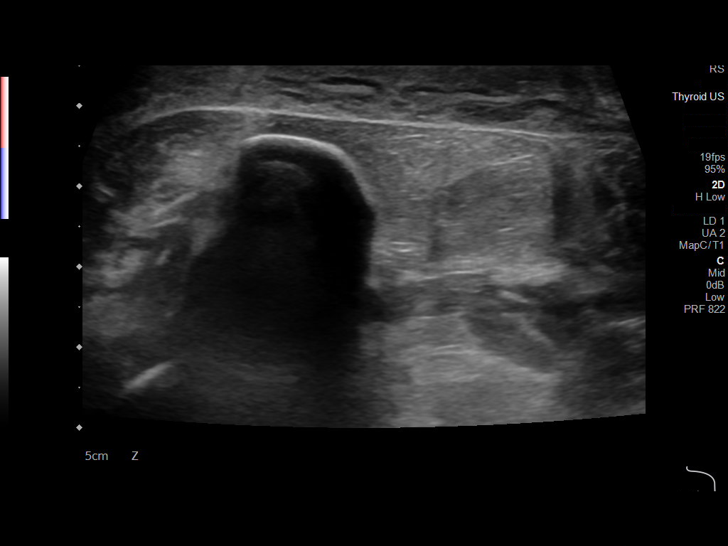

[14 of 17 positions shown; findings below may reference images not displayed]

FINDINGS: Targeted ultrasound was performed of the soft tissues of the left
forearm at area of patient's clinical concern. Within the soft
tissues is a well-defined ovoid anechoic fluid collection measuring
1.7 x 0.6 x 1.5 cm. No internal blood flow. No surrounding edema or
hyperemia. No additional solid or cystic abnormalities.
IMPRESSION: Well-defined 1.7 cm fluid collection within the soft tissues of the
left forearm at site of patient's clinical concern. Imaging features
are nonspecific but could represent sequela of hematoma or other
posttraumatic fluid collection such as a fascial degloving type
collection. The simple appearance and lack of surrounding edema or
hyperemia make abscess felt to be less likely.

## 2020-06-03 IMAGING — CT CT CHEST W/O CM
2 of 3 series · 15 of 36 positions shown, 18 images · non-contrast
Comparison: Chest radiographs [DATE], [DATE] and
[DATE]. Abdominal CT [DATE].

CLINICAL DATA: Pneumonia, unresolved sepsis. History of intravenous
drug abuse and MRSA bacteremia.

EXAM:
CT CHEST WITHOUT CONTRAST
TECHNIQUE: Multidetector CT imaging of the chest was performed following the
standard protocol without IV contrast.

[Series 3: chest w/o 2mm st · axial · non-contrast · 0.83mm/px · z∈[+1315,+1579]mm · 12 of 156 slices shown, 15 images]
[im 12/156  mediastinal]
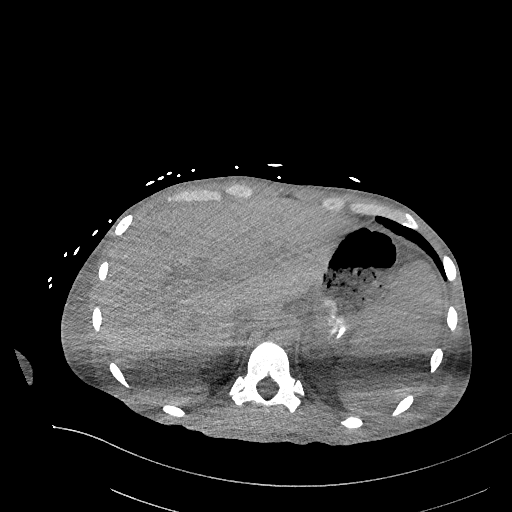
[im 12/156  lung]
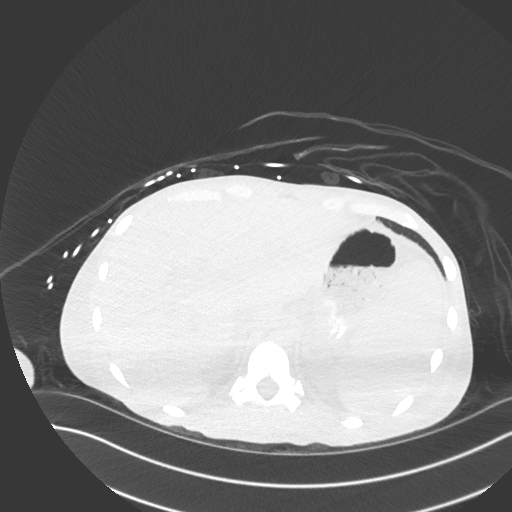
[im 23/156  lung]
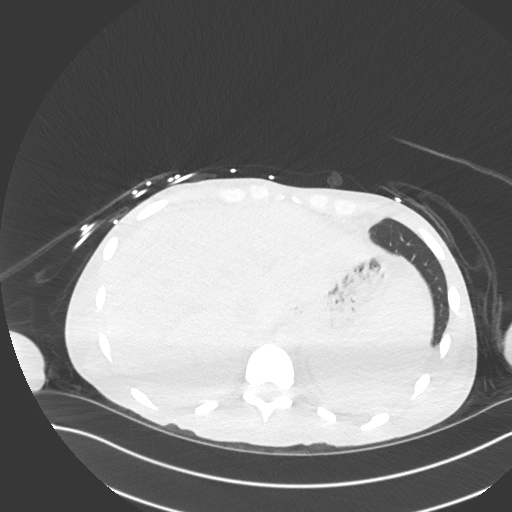
[im 35/156  lung]
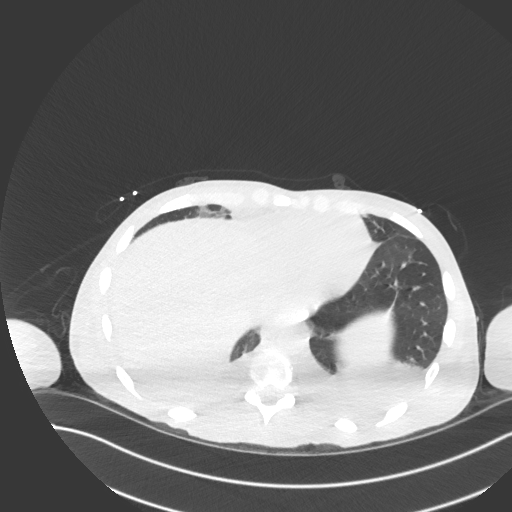
[im 46/156  lung]
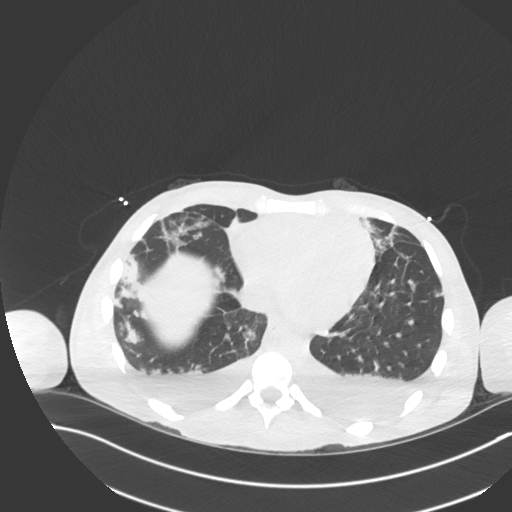
[im 58/156  mediastinal]
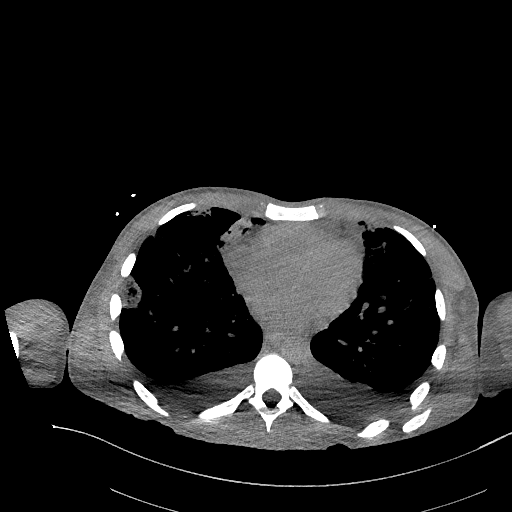
[im 58/156  lung]
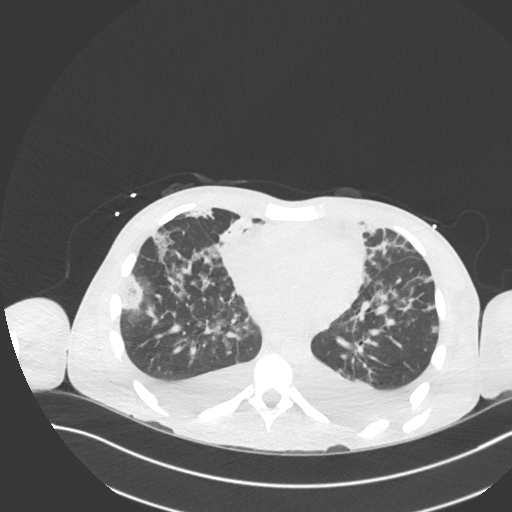
[im 69/156  lung]
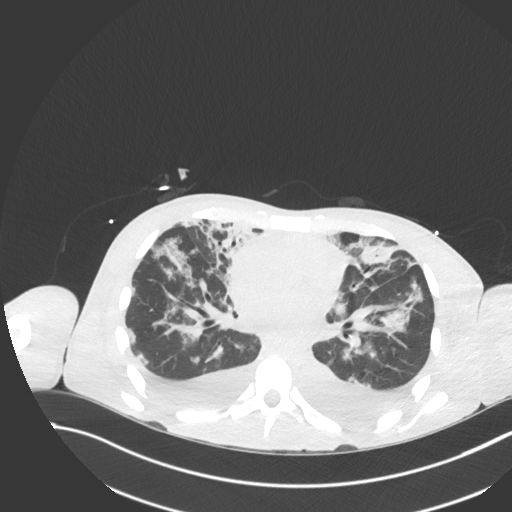
[im 87/156  lung]
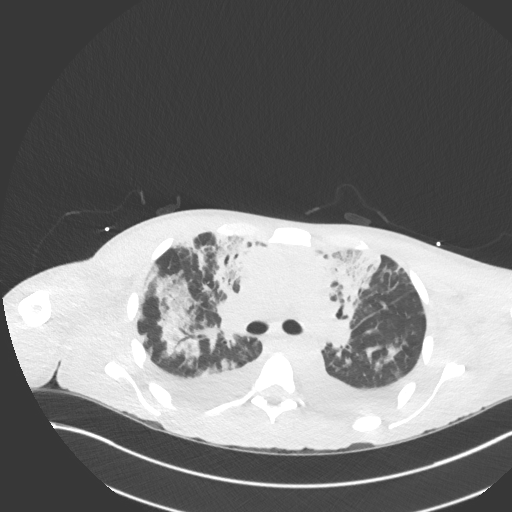
[im 98/156  lung]
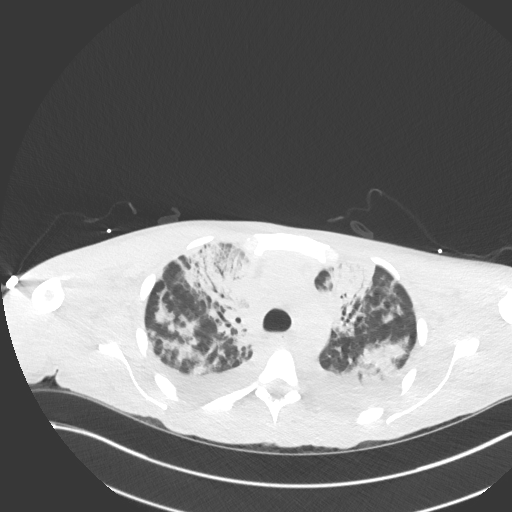
[im 110/156  mediastinal]
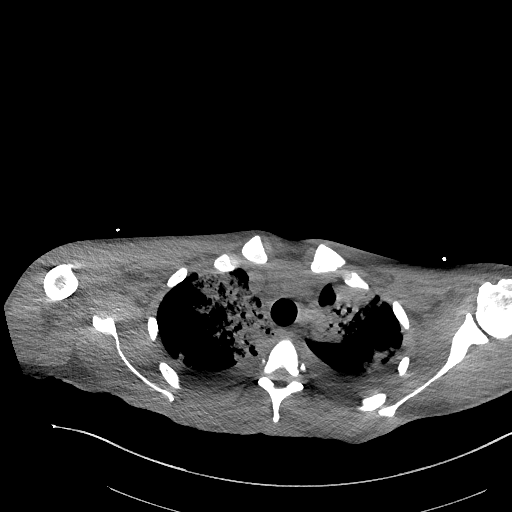
[im 110/156  lung]
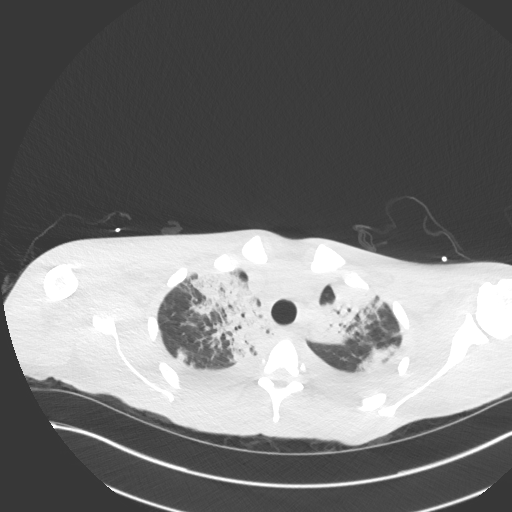
[im 121/156  lung]
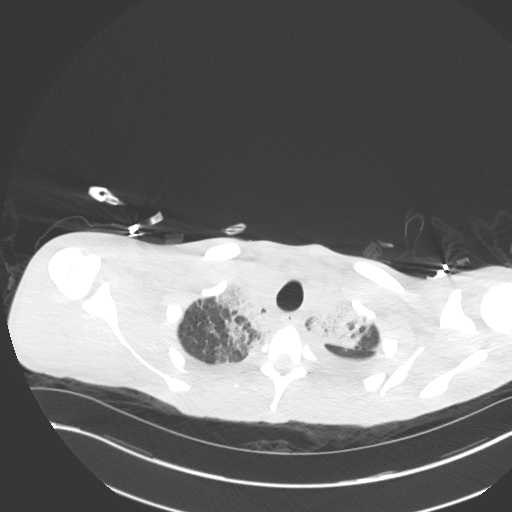
[im 133/156  lung]
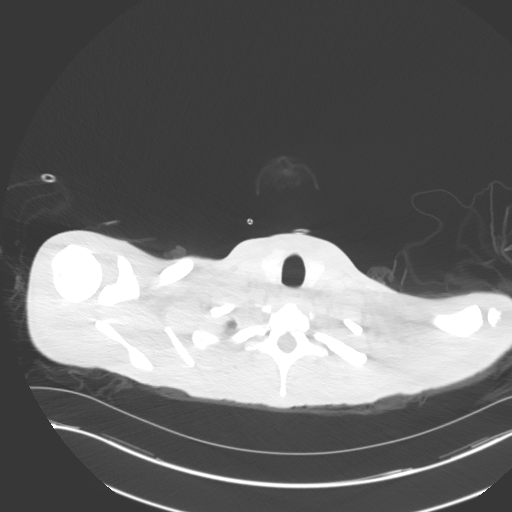
[im 144/156  lung]
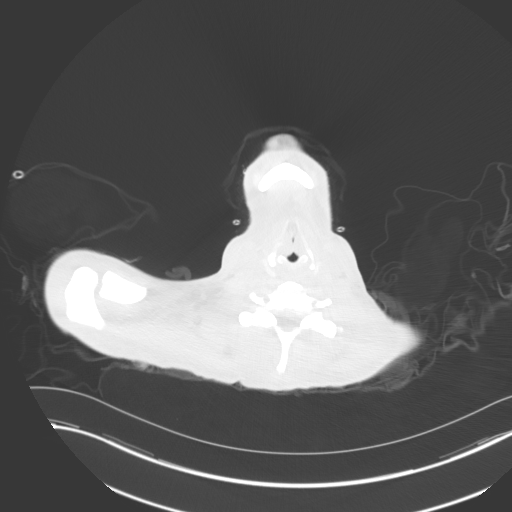

[Series 6: chest w/o 2mm st cor · coronal · non-contrast · 0.60mm/px · 3 of 122 slices shown]
[im 25/122  lung]
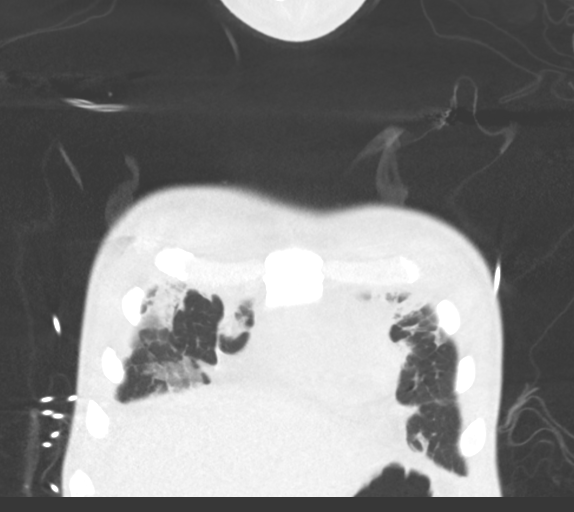
[im 49/122  lung]
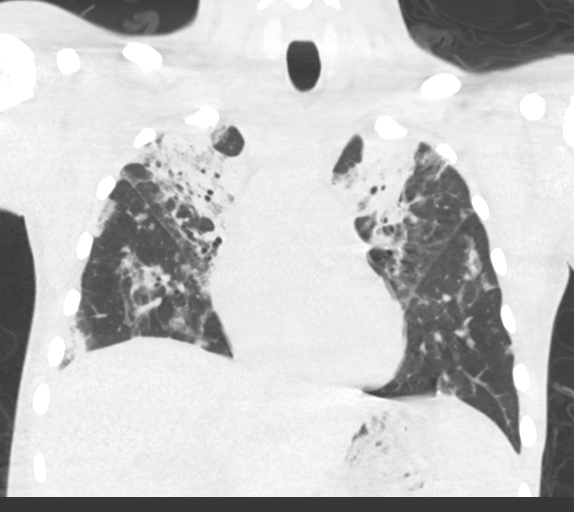
[im 73/122  lung]
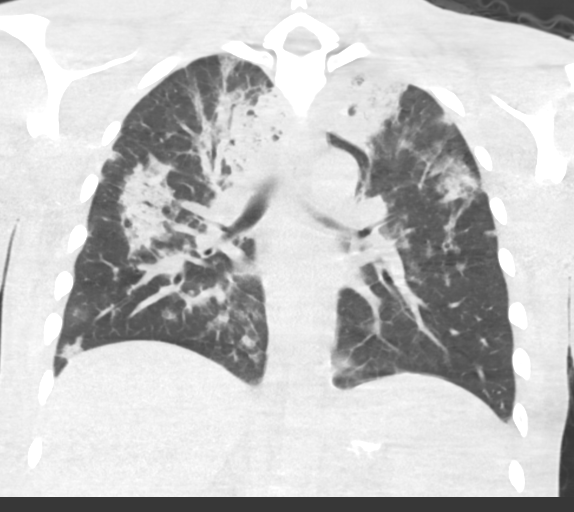

[15 of 36 positions shown; findings below may reference images not displayed]

FINDINGS: Cardiovascular: Vascular assessment limited by the lack of
intravenous contrast. There is low-density of the blood pool
consistent with anemia. No vascular abnormalities identified. The
heart size is normal. There is no pericardial effusion.

Mediastinum/Nodes: There are no enlarged mediastinal, hilar or
axillary lymph nodes.Hilar assessment is limited by the lack of
intravenous contrast, although the hilar contours appear unchanged.
The thyroid gland, trachea and esophagus demonstrate no significant
findings.

Lungs/Pleura: Moderate-sized dependent bilateral pleural effusions,
slightly improved from previous abdominal CT. Since that CT, there
is improved aeration of the dependent portions of both lower lobes.
However, there are multifocal airspace and ground-glass opacities
throughout the aerated lungs as seen on interval radiographs. There
is consolidation with air bronchograms in both upper lobes. No
endobronchial lesion, focal nodule or pneumothorax identified.

Upper abdomen: No acute or focal abnormalities are seen within the
upper abdomen.

Musculoskeletal/Chest wall: Generalized soft tissue edema consistent
with anasarca. No focal fluid collection or osseous abnormality
identified.
IMPRESSION: 1. Multifocal airspace and ground-glass opacities throughout the
aerated lungs with consolidation with air bronchograms in both upper
lobes, most consistent with multilobar pneumonia.
2. Moderate-sized dependent bilateral pleural effusions, slightly
improved from previous abdominal CT.
3. Generalized soft tissue edema consistent with anasarca.

## 2020-06-03 MED ORDER — PAROXETINE HCL 20 MG PO TABS
20.0000 mg | ORAL_TABLET | Freq: Every day | ORAL | Status: DC
  Administered 2020-06-04 – 2020-06-10 (×7): 20 mg via ORAL
  Filled 2020-06-03 (×7): qty 1
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
## 2020-06-03 NOTE — Progress Notes (Signed)
Called to unit to ask if patient is willing to go for scan. NT went and asked, patient still refusing scan. Stated maybe tomorrow.
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
## 2020-06-03 NOTE — Progress Notes (Signed)
Pharmacy Antibiotic Note  [name] is a 29 y.o. male admitted on 05/10/2020 with MRSA bacteremia due to myositis/cellulitis of left forearm and left lower extremity.  Pharmacy has been consulted for Daptomycin dosing.    Unasyn added per ID on 10/8 for possible pneumonia.    Weekly CK done today = 12. Good.   Creatinine trended up some yesterday but sl trend down today,   Weight has trended down from 80 >> 68 kg since 9/25. Current Daptomycin dose in ~11.8 mg/kg, okay.  Plan:  Continue Daptomycin 800 mg IV q24h.  Follow renal function and weights for any need to modify regimen.  Weekly CK on Tuesdays.  Unasyn 1.5 gm IV q6hrs per ID.  Follow up antibiotic plans.    Height: 5\' 10"  (177.8 cm) Weight: 68 kg (149 lb 14.6 oz) IBW/kg (Calculated) : 73  Temp (24hrs), Avg:98.3 F (36.8 C), Min:98.3 F (36.8 C), Max:98.3 F (36.8 C)  Recent Labs  Lab 05/29/20 0548 05/29/20 0548 05/30/20 0243 05/31/20 0306 06/01/20 0742 06/02/20 0702 06/03/20 0552  WBC 16.6*  --  17.8* 13.4* 14.3* 14.8*  --   CREATININE 1.65*   < > 1.68* 1.78* 1.73* 1.95* 1.82*   < > = values in this interval not displayed.    Estimated Creatinine Clearance: 57.6 mL/min (A) (by C-G formula based on SCr of 1.82 mg/dL (H)).    No Known Allergies  Antimicrobials this admission: Vancomycin x 1 on 9/18 Ceftriaxone x 1 on 9/18 Zyvox  9/18>> 9/21 Daptomycin 9/21 >> (increased 9/25) >> Unasyn 10/8 >> Oritavaccin x 1 on 9/27 Cipro 9/26 >>9/28  Dose adjustments this admission:  9/25: Daptomycin increased from ~7 to ~10 mg/kg/day  9/21 CK 72 9/24 CK 24 9/25 CK 18 9/28:CK 14 10/5: CK 189/21 CK 72 9/24 CK 24 9/25 CK 18 9/28:CK 14 10/5: CK 18 10/12: CK 12  Microbiology results: 9/18 urine: negative 9/18 COVID: negative 9/18 Blood: 1/4 bottles w/ Staph aureus 9/18:BCID Staph, mecA detected 9/20 repeat blood: negF 9/27 blood: negF  Thank you for allowing pharmacy to be a part of this patient's  care.  10/27, Dennie Fetters Phone: [phone] 06/03/2020 2:53 PM
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
## 2020-06-04 DIAGNOSIS — M00012 Staphylococcal arthritis, left shoulder: Secondary | ICD-10-CM

## 2020-06-04 LAB — CBC
HCT: 25.1 % — ABNORMAL LOW (ref 39.0–52.0)
Hemoglobin: 7.9 g/dL — ABNORMAL LOW (ref 13.0–17.0)
MCH: 26.8 pg (ref 26.0–34.0)
MCHC: 31.5 g/dL (ref 30.0–36.0)
MCV: 85.1 fL (ref 80.0–100.0)
Platelets: 552 10*3/uL — ABNORMAL HIGH (ref 150–400)
RBC: 2.95 MIL/uL — ABNORMAL LOW (ref 4.22–5.81)
RDW: 13.2 % (ref 11.5–15.5)
WBC: 13.9 10*3/uL — ABNORMAL HIGH (ref 4.0–10.5)
nRBC: 0 % (ref 0.0–0.2)

## 2020-06-04 LAB — BASIC METABOLIC PANEL
Anion gap: 8 (ref 5–15)
BUN: 30 mg/dL — ABNORMAL HIGH (ref 6–20)
CO2: 22 mmol/L (ref 22–32)
Calcium: 7.8 mg/dL — ABNORMAL LOW (ref 8.9–10.3)
Chloride: 104 mmol/L (ref 98–111)
Creatinine, Ser: 1.83 mg/dL — ABNORMAL HIGH (ref 0.61–1.24)
GFR, Estimated: 49 mL/min — ABNORMAL LOW (ref >60)
Glucose, Bld: 142 mg/dL — ABNORMAL HIGH (ref 70–99)
Potassium: 4.2 mmol/L (ref 3.5–5.1)
Sodium: 134 mmol/L — ABNORMAL LOW (ref 135–145)

## 2020-06-04 MED ORDER — DOXYCYCLINE HYCLATE 100 MG PO TABS
100.0000 mg | ORAL_TABLET | Freq: Two times a day (BID) | ORAL | Status: DC
  Administered 2020-06-04 – 2020-06-12 (×18): 100 mg via ORAL
  Filled 2020-06-04 (×19): qty 1
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
## 2020-06-04 NOTE — Plan of Care (Signed)
  Problem: Clinical Measurements: Goal: Respiratory complications will improve Outcome: Progressing   Problem: Nutrition: Goal: Adequate nutrition will be maintained Outcome: Progressing   Problem: Coping: Goal: Level of anxiety will decrease Outcome: Progressing   
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
## 2020-06-05 LAB — BASIC METABOLIC PANEL
Anion gap: 8 (ref 5–15)
BUN: 31 mg/dL — ABNORMAL HIGH (ref 6–20)
CO2: 22 mmol/L (ref 22–32)
Calcium: 7.7 mg/dL — ABNORMAL LOW (ref 8.9–10.3)
Chloride: 103 mmol/L (ref 98–111)
Creatinine, Ser: 1.82 mg/dL — ABNORMAL HIGH (ref 0.61–1.24)
GFR, Estimated: 49 mL/min — ABNORMAL LOW (ref >60)
Glucose, Bld: 93 mg/dL (ref 70–99)
Potassium: 4.6 mmol/L (ref 3.5–5.1)
Sodium: 133 mmol/L — ABNORMAL LOW (ref 135–145)
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
## 2020-06-05 NOTE — Plan of Care (Signed)
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
## 2020-06-05 NOTE — Progress Notes (Addendum)
CSW spoke with NP Junious Silk in reference to requesting a psych consult for pt due to his current state and things that pt disclosed to Chaplain. NP Rennis Harding spoke at pt dc disposition, CSW explained that pt is not SNF appropriate in a number of ways including lack of insurance and substance use. CSW sent financial counselor an email in regards to trying to help pt set up insurance, was told that pt has already been assigned to [facility] . CSW will continue to follow.

## 2020-06-05 NOTE — Plan of Care (Signed)
  Problem: Pain Managment: Goal: General experience of comfort will improve Outcome: Not Progressing   

## 2020-06-06 DIAGNOSIS — F321 Major depressive disorder, single episode, moderate: Secondary | ICD-10-CM | POA: Diagnosis present

## 2020-06-06 DIAGNOSIS — F1120 Opioid dependence, uncomplicated: Secondary | ICD-10-CM | POA: Diagnosis present

## 2020-06-06 LAB — BASIC METABOLIC PANEL
Anion gap: 7 (ref 5–15)
BUN: 29 mg/dL — ABNORMAL HIGH (ref 6–20)
CO2: 23 mmol/L (ref 22–32)
Calcium: 8.1 mg/dL — ABNORMAL LOW (ref 8.9–10.3)
Chloride: 103 mmol/L (ref 98–111)
Creatinine, Ser: 1.71 mg/dL — ABNORMAL HIGH (ref 0.61–1.24)
GFR, Estimated: 53 mL/min — ABNORMAL LOW (ref >60)
Glucose, Bld: 84 mg/dL (ref 70–99)
Potassium: 4.7 mmol/L (ref 3.5–5.1)
Sodium: 133 mmol/L — ABNORMAL LOW (ref 135–145)

## 2020-06-06 MED ORDER — DOXYCYCLINE HYCLATE 100 MG PO CAPS: 100.0000 mg | ORAL_CAPSULE | Freq: Two times a day (BID) | ORAL | 0 refills | Status: AC

## 2020-06-06 MED ORDER — DOXYCYCLINE HYCLATE 100 MG PO CAPS: 100.0000 mg | ORAL_CAPSULE | Freq: Two times a day (BID) | ORAL | 0 refills | Status: DC

## 2020-06-06 MED FILL — DOXYCYCLINE HYCLATE 100 MG: 100 | 30 days supply | Qty: 60 | Fill #0

## 2020-06-06 NOTE — Progress Notes (Signed)
Pain meds given to patient so he will participate with rehab demanded IV dilaudid he was given po oxy reported if med ineffective would follow up with IV dilaudid for pain after arguing a bit with me and PT he finally agreed sat on side of bed.

## 2020-06-06 NOTE — Progress Notes (Signed)
In get patient oob and attempt ambulation talked with him about sitting on side of bed,he reported hurting too bad did not feel like would try later. He has had his pain meds already trying to arrange activities around his pain meds so he will be comfortable with getting oob and ambulation.

## 2020-06-06 NOTE — Plan of Care (Signed)
  Problem: Nutrition: Goal: Adequate nutrition will be maintained Outcome: Completed/Met

## 2020-06-06 NOTE — Progress Notes (Signed)
Patient asking for assistance to sign up for "[employer] Care" insurance so he can get his shoulder fixed,writer alert case manager she states [employer] [facility] is working on his case already she would go talk with patient.
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
## 2020-06-06 NOTE — Discharge Instructions (Signed)
Substance Abuse Intensive Outpatient Care -[facility] Phone: [phone] Address: [address] [place], [place] [address] Hours: Open 24/7, No appointment required.

## 2020-06-06 NOTE — Progress Notes (Signed)
   06/05/20 2100  Assess: MEWS Score  ECG Heart Rate 79  Resp (!) 26 (pt was in pain, pain meds given )  Assess: MEWS Score  MEWS Temp 0  MEWS Systolic 0  MEWS Pulse 0  MEWS RR 2  MEWS LOC 0  MEWS Score 2  MEWS Score Color Yellow  Assess: if the MEWS score is Yellow or Red  Were vital signs taken at a resting state? No (pt was in pain )  Focused Assessment No change from prior assessment  Early Detection of Sepsis Score *See Row Information* Low  MEWS guidelines implemented *See Row Information* No, previously yellow, continue vital signs every 4 hours  Document  Patient Outcome Stabilized after interventions  Progress note created (see row info) Yes
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
## 2020-06-06 NOTE — Progress Notes (Addendum)
CSW went in to speak with pt, pt stated he would like to get update on insurance status. CSW will email financial counselor. Pt stated he would be interested in going to rehab once he is medically stable. Pt stated if possible he would like some reading materials like [employer] or fantasy books. CSW will try to locate and continue to follow.

## 2020-06-07 ENCOUNTER — Inpatient Hospital Stay: Payer: Self-pay

## 2020-06-07 ENCOUNTER — Inpatient Hospital Stay (HOSPITAL_COMMUNITY): Payer: Self-pay

## 2020-06-07 LAB — BASIC METABOLIC PANEL
Anion gap: 12 (ref 5–15)
BUN: 29 mg/dL — ABNORMAL HIGH (ref 6–20)
CO2: 18 mmol/L — ABNORMAL LOW (ref 22–32)
Calcium: 8.3 mg/dL — ABNORMAL LOW (ref 8.9–10.3)
Chloride: 103 mmol/L (ref 98–111)
Creatinine, Ser: 1.68 mg/dL — ABNORMAL HIGH (ref 0.61–1.24)
GFR, Estimated: 54 mL/min — ABNORMAL LOW (ref >60)
Glucose, Bld: 89 mg/dL (ref 70–99)
Potassium: 4.8 mmol/L (ref 3.5–5.1)
Sodium: 133 mmol/L — ABNORMAL LOW (ref 135–145)

## 2020-06-07 IMAGING — DX DG ABDOMEN 1V
1 series · 1 of 1 positions shown · non-contrast
Comparison: None.

CLINICAL DATA: Abdominal pain.

EXAM:
ABDOMEN - 1 VIEW

[abdomen kub]
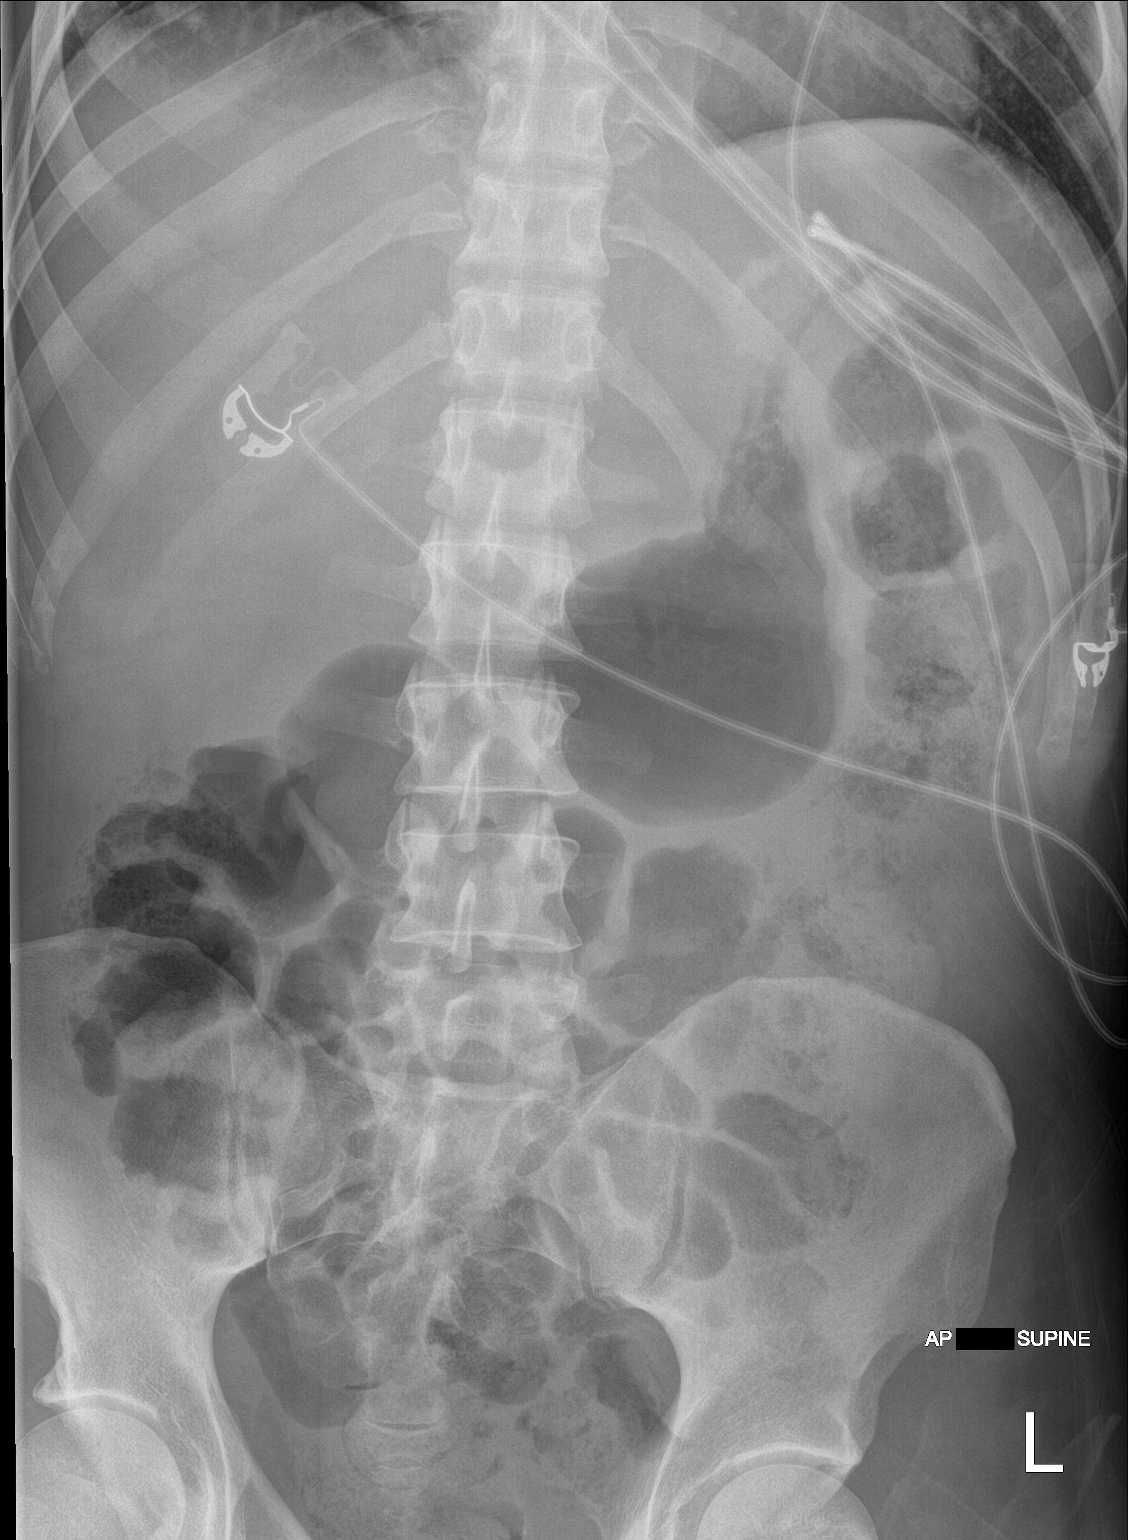

[1 of 1 positions shown; findings below may reference images not displayed]

FINDINGS: There is moderate fecal loading in the descending and sigmoid colon.
No evidence of bowel obstruction. No free air, portal venous gas, or
pneumatosis. No other acute abnormalities.
IMPRESSION: Moderate fecal loading in the descending and sigmoid colon. No other
abnormalities.

## 2020-06-07 IMAGING — DX DG CHEST 1V PORT
1 series · 1 of 1 positions shown · non-contrast
Comparison: [DATE]

CLINICAL DATA: Left-sided chest pain with inspiration.

EXAM:
PORTABLE CHEST 1 VIEW

[chest ap]
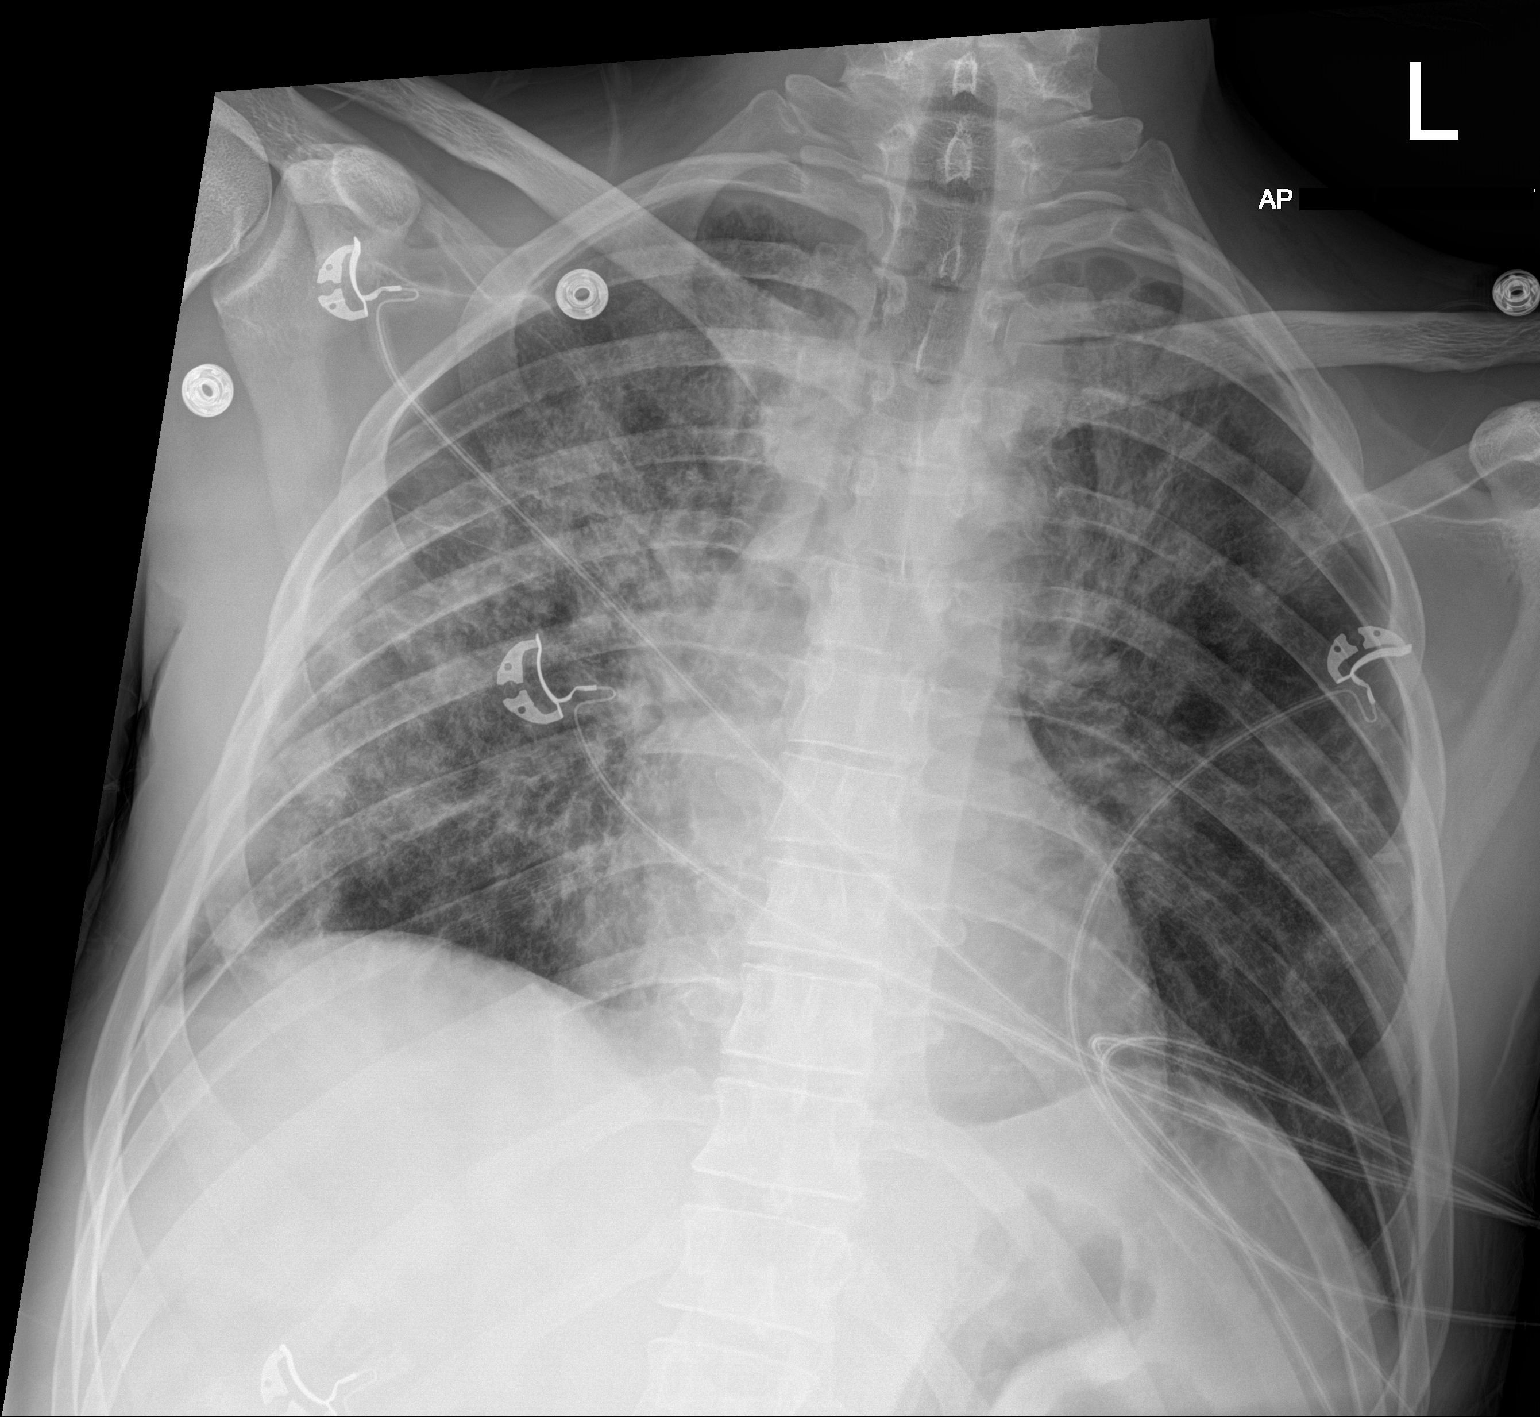

[1 of 1 positions shown; findings below may reference images not displayed]

FINDINGS: No pneumothorax. The heart, hila, mediastinum, and pleura are
unremarkable. Bilateral pulmonary infiltrates persist, mildly
improved on the left but mildly worsened on the right in the
interval. No other acute abnormalities.
IMPRESSION: Interval worsening of right-sided pulmonary infiltrates. Mild
improvement of left-sided pulmonary infiltrate which does remain
however. Findings are likely due to multifocal pneumonia. No other
acute abnormalities.

## 2020-06-07 MED ORDER — PROMETHAZINE HCL 25 MG/ML IJ SOLN
12.5000 mg | Freq: Four times a day (QID) | INTRAMUSCULAR | Status: DC | PRN
  Administered 2020-06-14: 12.5 mg via INTRAVENOUS
  Filled 2020-06-07 (×2): qty 1

## 2020-06-07 MED ORDER — CHLORHEXIDINE GLUCONATE CLOTH 2 % EX PADS
6.0000 | MEDICATED_PAD | Freq: Every day | CUTANEOUS | Status: DC
  Administered 2020-06-07 – 2020-07-02 (×21): 6 via TOPICAL

## 2020-06-07 MED ORDER — SODIUM CHLORIDE 0.9% FLUSH
10.0000 mL | Freq: Two times a day (BID) | INTRAVENOUS | Status: DC
  Administered 2020-06-07 – 2020-06-18 (×14): 10 mL
  Administered 2020-06-19: 20 mL
  Administered 2020-06-20 – 2020-07-02 (×22): 10 mL

## 2020-06-07 MED ORDER — SODIUM CHLORIDE 0.9% FLUSH: 10.0000 mL | INTRAVENOUS | Status: DC | PRN
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
## 2020-06-08 LAB — BASIC METABOLIC PANEL
Anion gap: 8 (ref 5–15)
BUN: 30 mg/dL — ABNORMAL HIGH (ref 6–20)
CO2: 23 mmol/L (ref 22–32)
Calcium: 8.3 mg/dL — ABNORMAL LOW (ref 8.9–10.3)
Chloride: 103 mmol/L (ref 98–111)
Creatinine, Ser: 1.56 mg/dL — ABNORMAL HIGH (ref 0.61–1.24)
GFR, Estimated: 59 mL/min — ABNORMAL LOW (ref >60)
Glucose, Bld: 88 mg/dL (ref 70–99)
Potassium: 4.9 mmol/L (ref 3.5–5.1)
Sodium: 134 mmol/L — ABNORMAL LOW (ref 135–145)

## 2020-06-08 MED ORDER — BISACODYL 5 MG PO TBEC
5.0000 mg | DELAYED_RELEASE_TABLET | Freq: Every day | ORAL | Status: DC | PRN
  Administered 2020-06-08: 5 mg via ORAL
  Filled 2020-06-08: qty 1

## 2020-06-08 NOTE — Progress Notes (Cosign Needed)
MSW Intern met with patient to discuss SA resources.  Patient expressed that he is interested in residential substance abuse program. Patient has no social supports and is homeless.  Patient expressed that he feels he has reached his limit with his substance use and wants to make a change.  Patient has a 29 year old son that he has not seen and feels now is the time to do something different.  Patient noted he has spoken to someone about applying for Medicaid.    TOC will continue to follow for discharge supports.
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
## 2020-06-09 LAB — BASIC METABOLIC PANEL
Anion gap: 10 (ref 5–15)
BUN: 27 mg/dL — ABNORMAL HIGH (ref 6–20)
CO2: 23 mmol/L (ref 22–32)
Calcium: 8.6 mg/dL — ABNORMAL LOW (ref 8.9–10.3)
Chloride: 102 mmol/L (ref 98–111)
Creatinine, Ser: 1.31 mg/dL — ABNORMAL HIGH (ref 0.61–1.24)
GFR, Estimated: >60 mL/min (ref >60)
Glucose, Bld: 86 mg/dL (ref 70–99)
Potassium: 4.6 mmol/L (ref 3.5–5.1)
Sodium: 135 mmol/L (ref 135–145)

## 2020-06-09 MED ORDER — DEXAMETHASONE SODIUM PHOSPHATE 10 MG/ML IJ SOLN
4.0000 mg | Freq: Once | INTRAMUSCULAR | Status: AC
  Administered 2020-06-09: 4 mg via INTRAVENOUS
  Filled 2020-06-09: qty 1

## 2020-06-09 MED ORDER — BUTALBITAL-APAP-CAFFEINE 50-325-40 MG PO TABS
1.0000 | ORAL_TABLET | ORAL | Status: DC | PRN
  Administered 2020-06-16: 1 via ORAL
  Filled 2020-06-09: qty 1

## 2020-06-09 MED ORDER — MAGNESIUM HYDROXIDE 400 MG/5ML PO SUSP
30.0000 mL | Freq: Once | ORAL | Status: AC
  Administered 2020-06-09: 30 mL via ORAL
  Filled 2020-06-09: qty 30

## 2020-06-09 NOTE — Progress Notes (Signed)
Patient c/o constipation given MOM today and prune juice for c/o.
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
## 2020-06-09 NOTE — Plan of Care (Signed)
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
## 2020-06-10 ENCOUNTER — Inpatient Hospital Stay (HOSPITAL_COMMUNITY): Payer: Self-pay

## 2020-06-10 LAB — BASIC METABOLIC PANEL
Anion gap: 8 (ref 5–15)
BUN: 28 mg/dL — ABNORMAL HIGH (ref 6–20)
CO2: 26 mmol/L (ref 22–32)
Calcium: 9 mg/dL (ref 8.9–10.3)
Chloride: 103 mmol/L (ref 98–111)
Creatinine, Ser: 1.39 mg/dL — ABNORMAL HIGH (ref 0.61–1.24)
GFR, Estimated: >60 mL/min (ref >60)
Glucose, Bld: 79 mg/dL (ref 70–99)
Potassium: 4.6 mmol/L (ref 3.5–5.1)
Sodium: 137 mmol/L (ref 135–145)

## 2020-06-10 IMAGING — CT CT ABD-PELV W/O CM
2 of 4 series · 15 of 46 positions shown, 17 images · non-contrast
Comparison: None.

CLINICAL DATA: Acute abdominal pain, bacteremia

EXAM:
CT ABDOMEN AND PELVIS WITHOUT CONTRAST
TECHNIQUE: Multidetector CT imaging of the abdomen and pelvis was performed
following the standard protocol without IV contrast.

[Series 4: abd/ pelvis 5.0 i30f 2 · axial · 0.87mm/px · z∈[+876,+1300]mm · 12 of 97 slices shown, 14 images]
[im 8/97  soft-tissue]
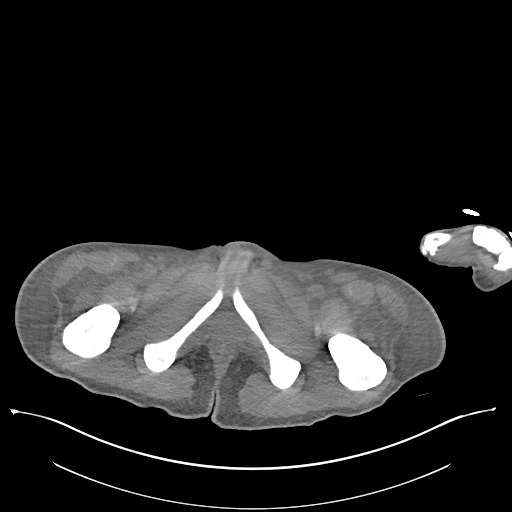
[im 8/97  bone]
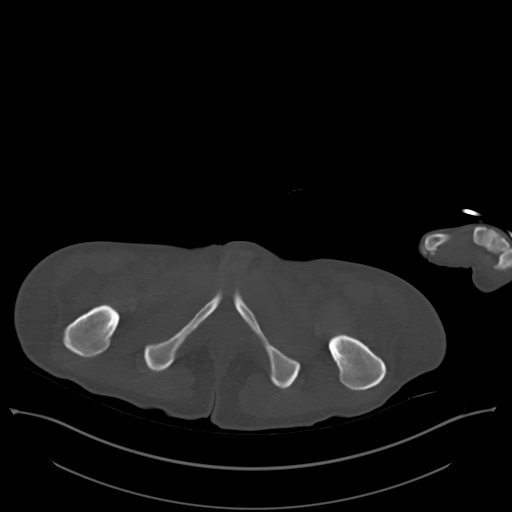
[im 16/97  soft-tissue]
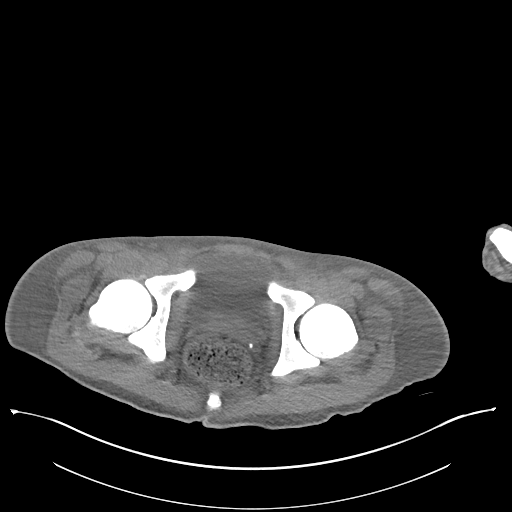
[im 24/97  soft-tissue]
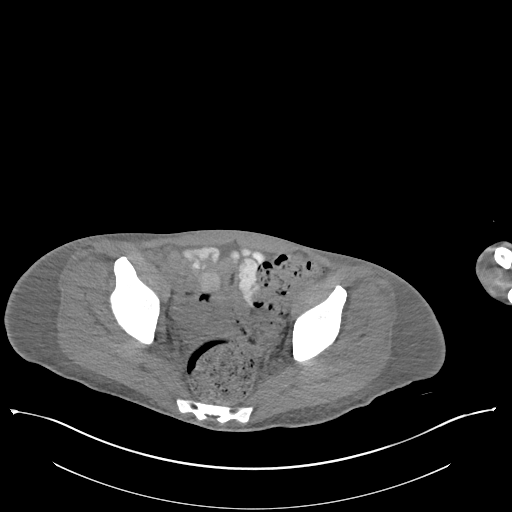
[im 31/97  soft-tissue]
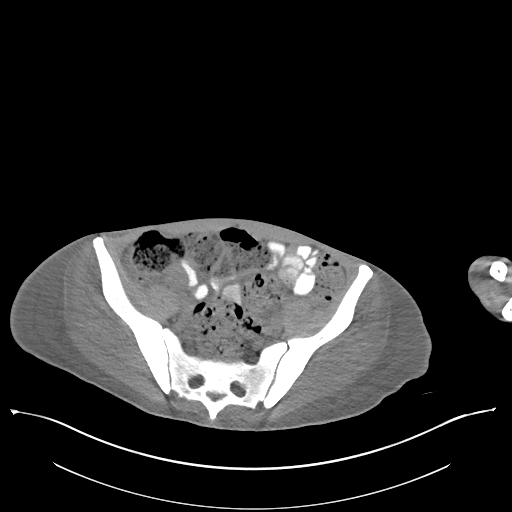
[im 39/97  soft-tissue]
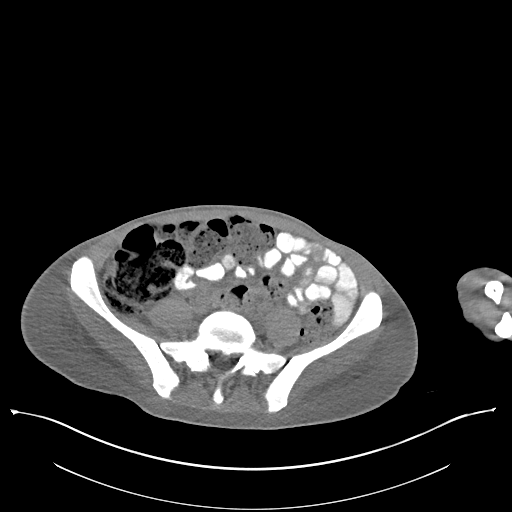
[im 47/97  soft-tissue]
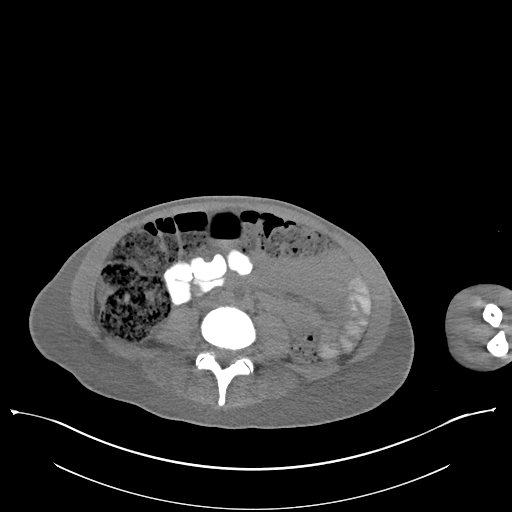
[im 54/97  soft-tissue]
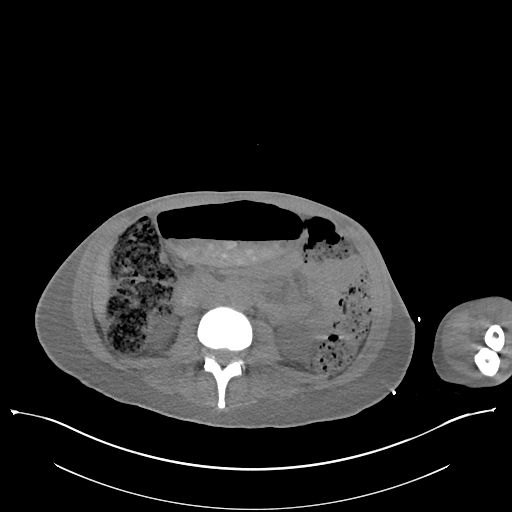
[im 62/97  soft-tissue]
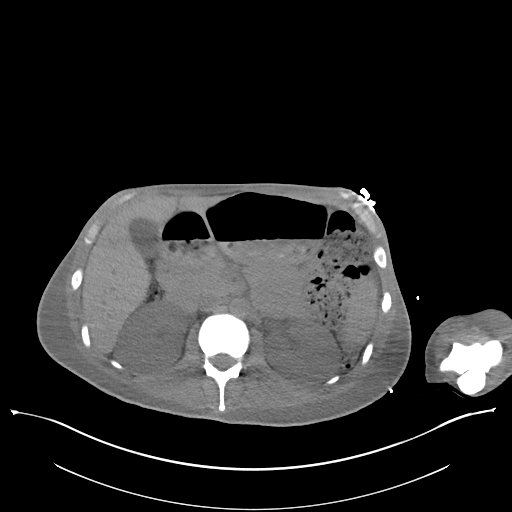
[im 70/97  soft-tissue]
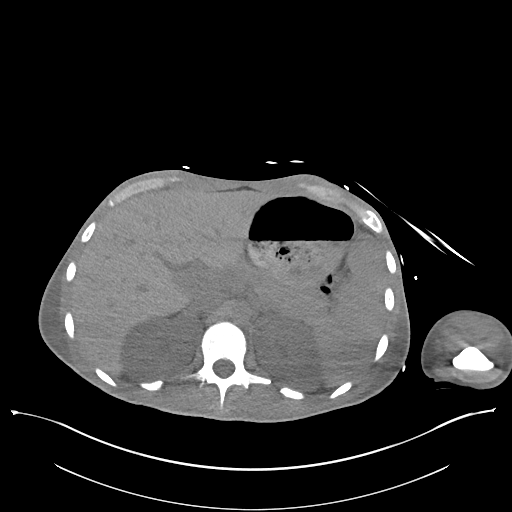
[im 70/97  bone]
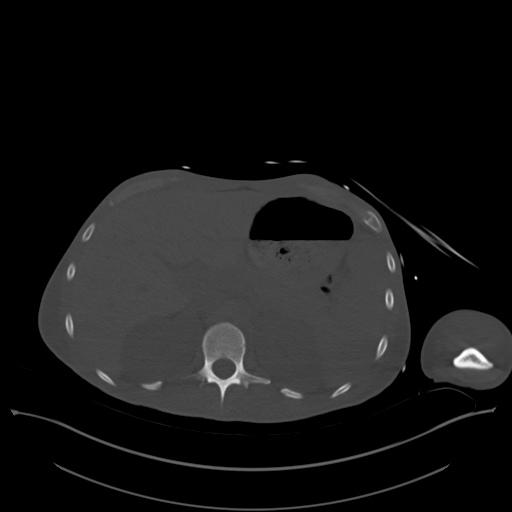
[im 77/97  soft-tissue]
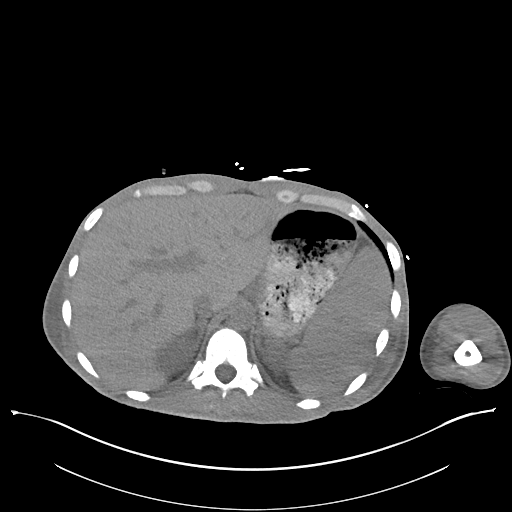
[im 85/97  soft-tissue]
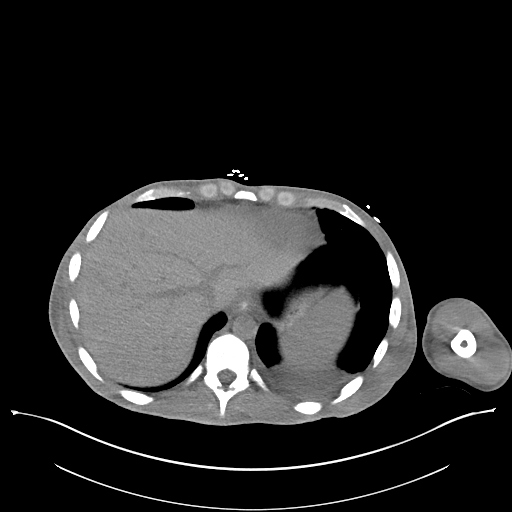
[im 93/97  soft-tissue]
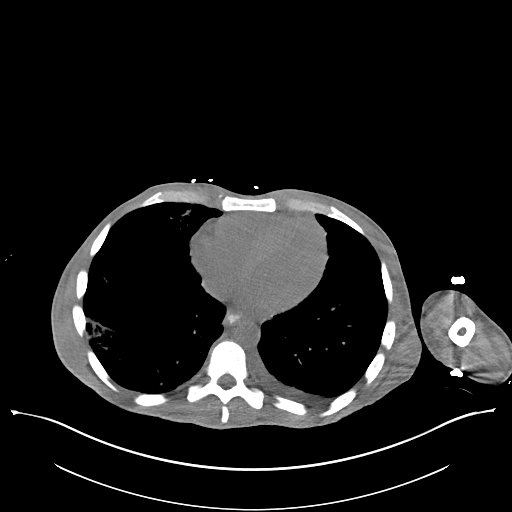

[Series 7: cor st · coronal · 0.70mm/px · 3 of 100 slices shown]
[im 34/100  soft-tissue]
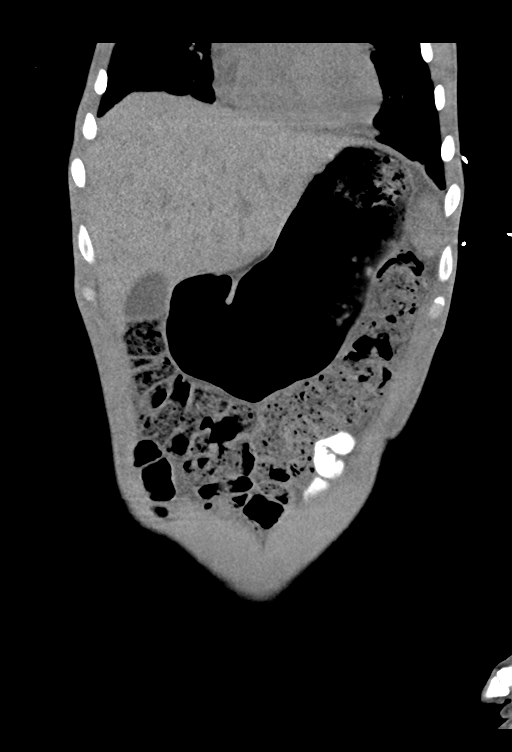
[im 45/100  soft-tissue]
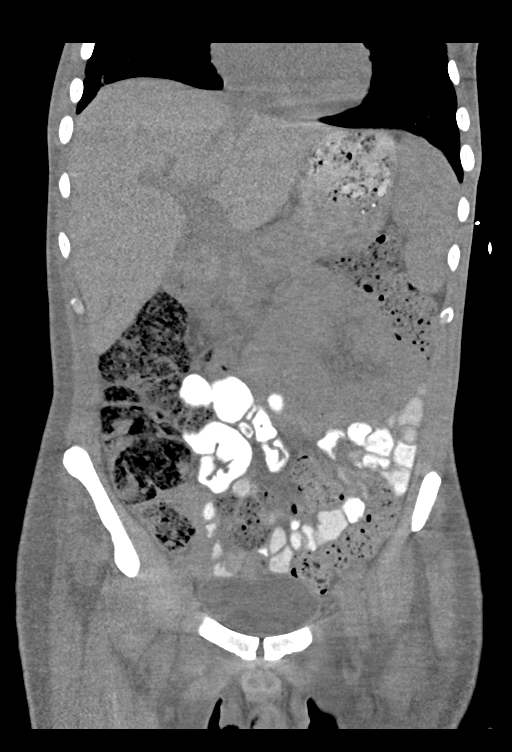
[im 56/100  soft-tissue]
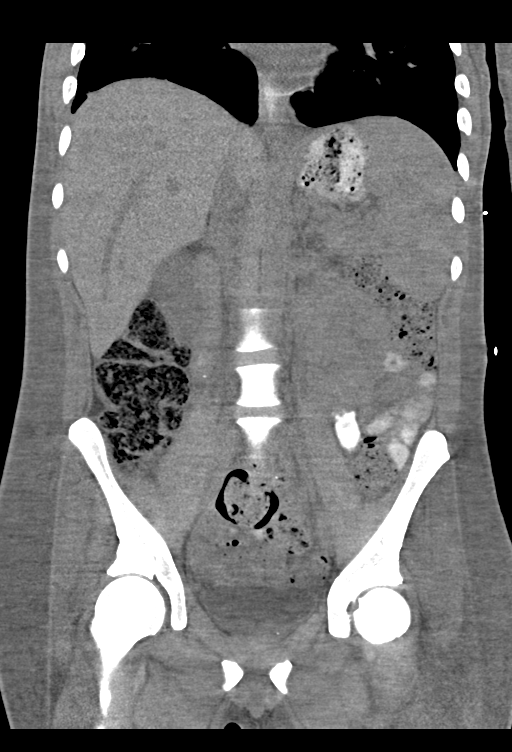

[15 of 46 positions shown; findings below may reference images not displayed]

FINDINGS: Lower chest: Bibasilar pulmonary infiltrates are present,
asymmetrically more severe within the right lung base, likely
infectious or inflammatory. Previously noted pleural effusions have
significantly improved, resolved on the right and now small on the
left. Lower lobe collapse has resolved on the right and minimal
compressive atelectasis remains on the left. Hypoattenuation of the
cardiac blood pool is in keeping with at least moderate anemia.
Cardiac size is within normal limits. No pericardial effusion.

Hepatobiliary: Mild hyperattenuation of the liver parenchyma is
nonspecific, but can be seen with chronic administration of certain
medications, such as amiodarone, depositional disease such as
hemochromatosis, or glycogen storage disease. No focal liver lesion.
No intra or extrahepatic biliary ductal dilation. Gallbladder
unremarkable.

Pancreas: Unremarkable

Spleen: Mild splenomegaly is stable.

Adrenals/Urinary Tract: Adrenal glands are unremarkable. Kidneys are
normal, without renal calculi, focal lesion, or hydronephrosis.
Bladder is unremarkable.

Stomach/Bowel: Large stool seen throughout the colon extending into
the rectal vault. No evidence of obstruction. Stomach and small
bowel are unremarkable. Appendix normal.

Vascular/Lymphatic: No significant vascular findings are present. No
enlarged abdominal or pelvic lymph nodes.

Reproductive: Prostate is unremarkable.

Other: There is diffuse body wall subcutaneous edema in keeping with
anasarca.

Musculoskeletal: No lytic or blastic bone lesions. No acute bone
abnormality.
IMPRESSION: Large stool throughout the colon and rectal vault. Correlation for
fecal impaction may be helpful.

Near complete resolution of bilateral pleural effusions noted on
prior examination with re-expansion of the lower lobes. Bibasilar
pulmonary infiltrates now evident, likely infectious or inflammatory
in nature.

Diffuse body wall edema in keeping with anasarca.

## 2020-06-10 MED ORDER — BISACODYL 10 MG RE SUPP
10.0000 mg | Freq: Once | RECTAL | Status: AC
  Administered 2020-06-10: 10 mg via RECTAL
  Filled 2020-06-10: qty 1

## 2020-06-10 MED ORDER — HYDROMORPHONE HCL 1 MG/ML IJ SOLN
0.5000 mg | INTRAMUSCULAR | Status: DC | PRN
  Administered 2020-06-10 – 2020-06-13 (×14): 0.5 mg via INTRAVENOUS
  Filled 2020-06-10 (×14): qty 0.5

## 2020-06-10 MED ORDER — OXYCODONE HCL 5 MG PO TABS
7.5000 mg | ORAL_TABLET | ORAL | Status: DC | PRN
  Administered 2020-06-10 – 2020-06-16 (×26): 7.5 mg via ORAL
  Filled 2020-06-10 (×26): qty 2

## 2020-06-10 MED ORDER — FLEET ENEMA 7-19 GM/118ML RE ENEM
1.0000 | ENEMA | Freq: Once | RECTAL | Status: DC
  Filled 2020-06-10: qty 1

## 2020-06-10 MED ORDER — IOHEXOL 9 MG/ML PO SOLN
500.0000 mL | ORAL | Status: AC
  Administered 2020-06-10 (×2): 500 mL via ORAL

## 2020-06-10 MED ORDER — PAROXETINE HCL 30 MG PO TABS
30.0000 mg | ORAL_TABLET | Freq: Every day | ORAL | Status: DC
  Administered 2020-06-11 – 2020-07-02 (×22): 30 mg via ORAL
  Filled 2020-06-10 (×22): qty 1

## 2020-06-10 NOTE — Plan of Care (Signed)

## 2020-06-10 NOTE — Progress Notes (Signed)
Patient requesting personal assistance to the bathroom. Upon entering room patient was already sitting on the EOB independently. Patient was able to bring feet to knees to assist with putting on socks. Gait belt used and front wheel walker to assist patient to the bathroom. Patient able to pull himself from a sitting position to standing and from there this RN used a steadying assist as patient is a little wobbly on his feet.
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
## 2020-06-11 LAB — BASIC METABOLIC PANEL
Anion gap: 7 (ref 5–15)
BUN: 30 mg/dL — ABNORMAL HIGH (ref 6–20)
CO2: 27 mmol/L (ref 22–32)
Calcium: 8.9 mg/dL (ref 8.9–10.3)
Chloride: 104 mmol/L (ref 98–111)
Creatinine, Ser: 1.45 mg/dL — ABNORMAL HIGH (ref 0.61–1.24)
GFR, Estimated: >60 mL/min (ref >60)
Glucose, Bld: 92 mg/dL (ref 70–99)
Potassium: 4.7 mmol/L (ref 3.5–5.1)
Sodium: 138 mmol/L (ref 135–145)

## 2020-06-11 MED ORDER — LACTULOSE 10 GM/15ML PO SOLN
30.0000 g | Freq: Once | ORAL | Status: AC
  Administered 2020-06-11: 30 g via ORAL
  Filled 2020-06-11: qty 45

## 2020-06-11 MED ORDER — METHOCARBAMOL 500 MG PO TABS
750.0000 mg | ORAL_TABLET | Freq: Three times a day (TID) | ORAL | Status: DC
  Administered 2020-06-11 – 2020-06-23 (×38): 750 mg via ORAL
  Filled 2020-06-11 (×38): qty 2

## 2020-06-11 MED ORDER — SODIUM CHLORIDE 0.9 % IV SOLN: INTRAVENOUS | Status: DC

## 2020-06-11 MED ORDER — MUSCLE RUB 10-15 % EX CREA
TOPICAL_CREAM | CUTANEOUS | Status: DC | PRN
  Administered 2020-06-12: 1 via TOPICAL
  Filled 2020-06-11: qty 85

## 2020-06-11 NOTE — Plan of Care (Signed)
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
## 2020-06-12 ENCOUNTER — Inpatient Hospital Stay: Payer: Self-pay | Admitting: Physician Assistant

## 2020-06-12 LAB — BASIC METABOLIC PANEL
Anion gap: 8 (ref 5–15)
BUN: 21 mg/dL — ABNORMAL HIGH (ref 6–20)
CO2: 26 mmol/L (ref 22–32)
Calcium: 9 mg/dL (ref 8.9–10.3)
Chloride: 103 mmol/L (ref 98–111)
Creatinine, Ser: 1.44 mg/dL — ABNORMAL HIGH (ref 0.61–1.24)
GFR, Estimated: >60 mL/min (ref >60)
Glucose, Bld: 104 mg/dL — ABNORMAL HIGH (ref 70–99)
Potassium: 4.6 mmol/L (ref 3.5–5.1)
Sodium: 137 mmol/L (ref 135–145)

## 2020-06-12 MED ORDER — POLYETHYLENE GLYCOL 3350 17 GM/SCOOP PO POWD
1.0000 | Freq: Once | ORAL | Status: DC
  Filled 2020-06-12: qty 255

## 2020-06-12 MED ORDER — POLYETHYLENE GLYCOL 3350 17 G PO PACK
17.0000 g | PACK | Freq: Two times a day (BID) | ORAL | Status: DC
  Administered 2020-06-12 – 2020-07-02 (×33): 17 g via ORAL
  Filled 2020-06-12 (×33): qty 1

## 2020-06-12 MED ORDER — DOXYCYCLINE HYCLATE 100 MG PO TABS
100.0000 mg | ORAL_TABLET | Freq: Two times a day (BID) | ORAL | Status: DC
  Administered 2020-06-13 – 2020-07-02 (×39): 100 mg via ORAL
  Filled 2020-06-12 (×41): qty 1
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
## 2020-06-12 NOTE — Plan of Care (Signed)

## 2020-06-12 NOTE — Plan of Care (Signed)
  Problem: Education: Goal: Knowledge of General Education information will improve Description: Including pain rating scale, medication(s)/side effects and non-pharmacologic comfort measures Outcome: Progressing   Problem: Clinical Measurements: Goal: Ability to maintain clinical measurements within normal limits will improve Outcome: Progressing Goal: Will remain free from infection Outcome: Progressing Goal: Diagnostic test results will improve Outcome: Progressing Goal: Respiratory complications will improve Outcome: Progressing Goal: Cardiovascular complication will be avoided Outcome: Progressing   Problem: Activity: Goal: Risk for activity intolerance will decrease Outcome: Progressing   Problem: Coping: Goal: Level of anxiety will decrease Outcome: Progressing   Problem: Elimination: Goal: Will not experience complications related to bowel motility Outcome: Progressing Goal: Will not experience complications related to urinary retention Outcome: Progressing   Problem: Pain Managment: Goal: General experience of comfort will improve Outcome: Progressing   Problem: Skin Integrity: Goal: Risk for impaired skin integrity will decrease Outcome: Progressing   Problem: Safety: Goal: Ability to remain free from injury will improve Outcome: Progressing
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
## 2020-06-13 NOTE — Plan of Care (Signed)
  Problem: Education: Goal: Knowledge of General Education information will improve Description Including pain rating scale, medication(s)/side effects and non-pharmacologic comfort measures Outcome: Progressing   
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
## 2020-06-14 ENCOUNTER — Inpatient Hospital Stay (HOSPITAL_COMMUNITY): Payer: Self-pay

## 2020-06-14 DIAGNOSIS — M009 Pyogenic arthritis, unspecified: Secondary | ICD-10-CM

## 2020-06-14 IMAGING — DX DG SHOULDER 1V*L*
2 series · 2 of 2 positions shown · non-contrast
Comparison: None.

CLINICAL DATA: Fall and pain

EXAM:
LEFT SHOULDER

[shoulder ap (1 of 2)]
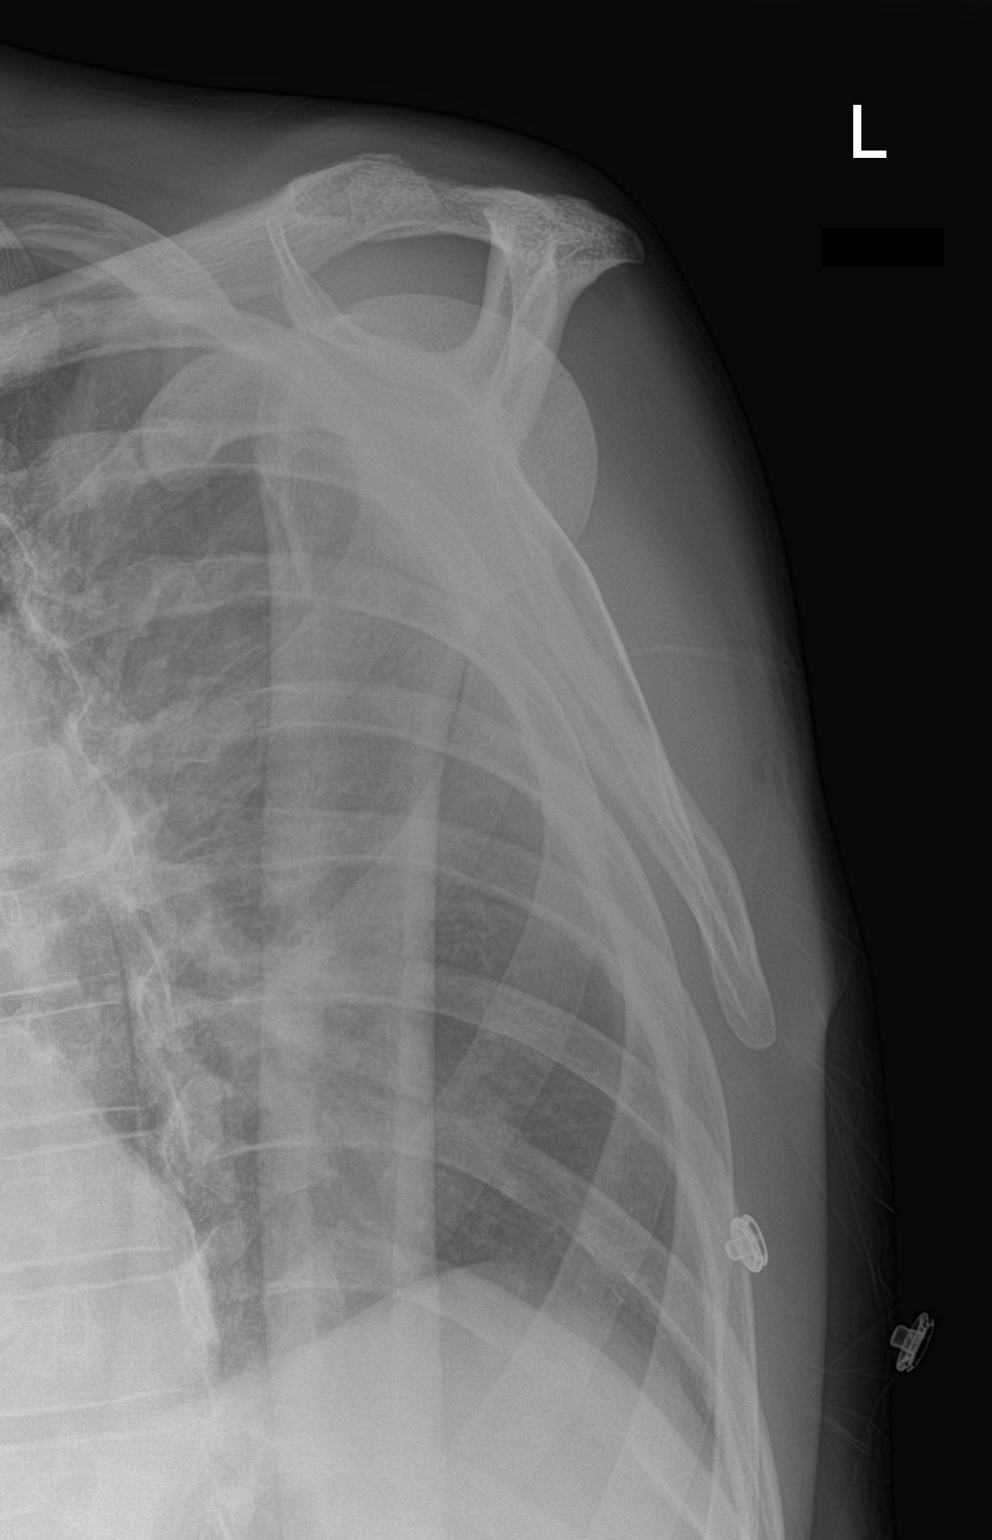

[shoulder ap (2 of 2)]
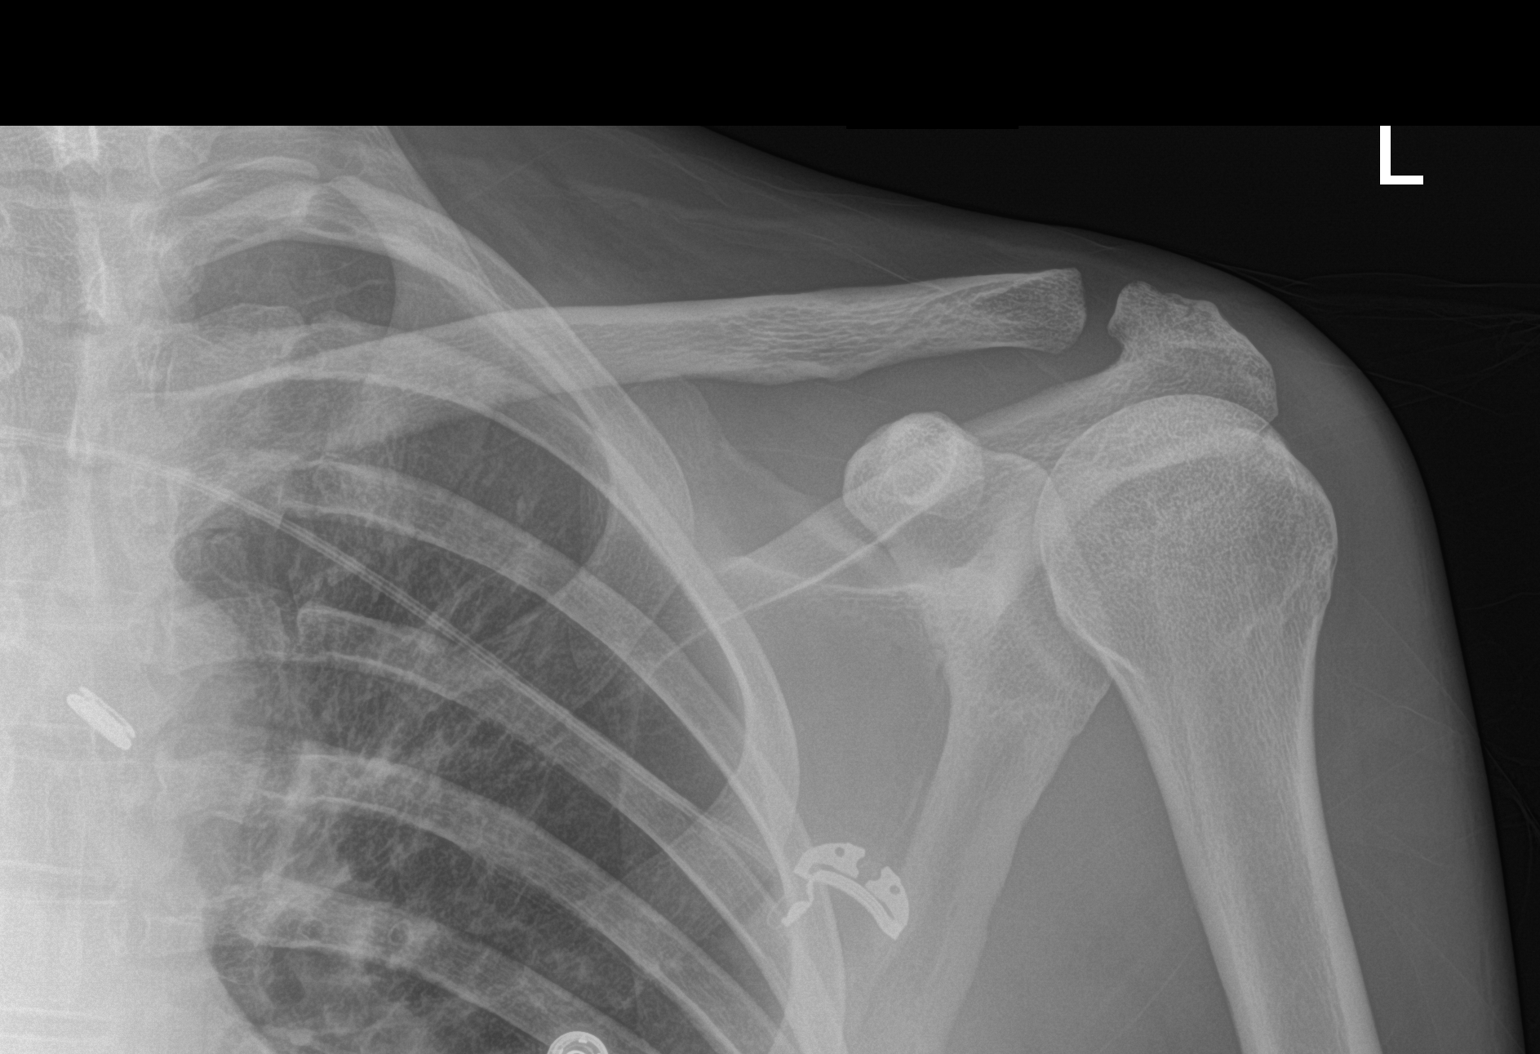

[2 of 2 positions shown; findings below may reference images not displayed]

FINDINGS: There is a nondisplaced fracture seen through the superior chromium.
No AC joint widening is seen. No other definite fracture is
identified. Mild overlying soft tissue swelling seen.
IMPRESSION: Nondisplaced fracture seen through the superior acromion.

## 2020-06-14 MED ORDER — HYDROMORPHONE HCL 2 MG PO TABS
4.0000 mg | ORAL_TABLET | ORAL | Status: AC | PRN
  Administered 2020-06-14 – 2020-06-15 (×3): 4 mg via ORAL
  Filled 2020-06-14 (×3): qty 2

## 2020-06-14 NOTE — Progress Notes (Signed)
Patient was seen after an unwitnessed fall. He reports getting up to use bathroom, was trying to support himself on bedside table but it rolled out from under him and he fell to his bottom. He denies hitting head, denies any buttock/hip/LE pain, but complains of severe worsening in his preexisting left shoulder pain.   Exam of left shoulder is limited by severe pain with any palpation or ROM. Patient concerned the shoulder is dislocated though no gross deformity. Neurovascularly intact distally.  Plan to check plain films of left shoulder and control pain with a very limited course of oral Dilaudid. Discussed plan with patient and RN.
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
## 2020-06-15 NOTE — Progress Notes (Signed)
Patient found sitting on floor beside bed. Pt said that he was trying to get to rest room and bedside table rolled from under him. Pt complains of left shoulder pain. When I asked why he got up alone (he has not gotten up alone this entire hospital stay) he said that he had been getting up alone during day shift and walking the halls. No bruises or lacerations were noted to patient. Vital Signs stable. On call MD notified. Bed alarm set and patient up graded to high fall risk.

## 2020-06-16 MED ORDER — OXYCODONE HCL 5 MG PO TABS
5.0000 mg | ORAL_TABLET | ORAL | Status: DC | PRN
  Administered 2020-06-16 – 2020-06-19 (×10): 5 mg via ORAL
  Filled 2020-06-16 (×10): qty 1

## 2020-06-16 MED ORDER — OXYCODONE HCL ER 20 MG PO T12A
20.0000 mg | EXTENDED_RELEASE_TABLET | Freq: Two times a day (BID) | ORAL | Status: DC
  Administered 2020-06-16 – 2020-06-20 (×9): 20 mg via ORAL
  Filled 2020-06-16 (×9): qty 1

## 2020-06-16 NOTE — Progress Notes (Signed)
Per progression nurse, patient will not be probably d/c today. Will go ahead and order a low bed since patient had a fall few days ago.

## 2020-06-16 NOTE — Progress Notes (Signed)
Low bed not avalable at this time per portable.
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
## 2020-06-16 NOTE — Progress Notes (Signed)
Contacted ortho tech about sling.

## 2020-06-16 NOTE — Progress Notes (Signed)
Orthopedic Tech Progress Note Patient Details:  [name] 1991-01-31 [id number] Asked patient if he would like to have it on at this moment and he said no, He was in a comfortable position at the time. Ortho Devices Type of Ortho Device: Shoulder immobilizer Ortho Device/Splint Location: LUE Ortho Device/Splint Interventions: Ordered   Post Interventions Patient Tolerated: Well Instructions Provided: Care of device   [name] 06/16/2020, 10:33 AM
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
## 2020-06-19 ENCOUNTER — Ambulatory Visit: Payer: Self-pay | Admitting: Internal Medicine

## 2020-06-19 NOTE — TOC Progression Note (Signed)
Transition of Care [facility]) - Progression Note    Patient Details  Name: [name] MRN: [id number] Date of Birth: [birth year]  Transition of Care [facility]) CM/SW Contact  Leone Haven, RN Phone Number: 06/19/2020, 4:50 PM  Clinical Narrative:    Pt still on pain meds, not appropriate for [facility] until completely off of pain meds. TOC team will cont to follow for dc needs.         Expected Discharge Plan and Services                                                 Social Determinants of Health (SDOH) Interventions Financial Strain Interventions: [id number] Referral Housing Interventions: [id number] Referral Stress Interventions: Intervention Not Indicated Social Connections Interventions: Intervention Not Indicated Transportation Interventions: [id number] Referral  Readmission Risk Interventions No flowsheet data found.
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
## 2020-06-19 NOTE — Progress Notes (Signed)
Pt remains A/Ox4. VSS. Takes medication whole and tolerated well. Ambulated  With assistance to BR x1 for BM; good urine output using condom cath. Safety maintained. Bed lowered and locked, call light in reach and bed alarm activated. Will continue to monitor.
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
## 2020-06-19 NOTE — Progress Notes (Signed)
Pt still on pain meds, not appropriate for [facility] while on any meds. Patient was and is able to walk and move about freely until the fall. CSW will continue to follow for appropriate placement options.

## 2020-06-20 LAB — BASIC METABOLIC PANEL
Anion gap: 8 (ref 5–15)
BUN: 26 mg/dL — ABNORMAL HIGH (ref 6–20)
CO2: 26 mmol/L (ref 22–32)
Calcium: 9.2 mg/dL (ref 8.9–10.3)
Chloride: 105 mmol/L (ref 98–111)
Creatinine, Ser: 1.29 mg/dL — ABNORMAL HIGH (ref 0.61–1.24)
GFR, Estimated: >60 mL/min (ref >60)
Glucose, Bld: 102 mg/dL — ABNORMAL HIGH (ref 70–99)
Potassium: 4.4 mmol/L (ref 3.5–5.1)
Sodium: 139 mmol/L (ref 135–145)

## 2020-06-20 MED ORDER — IBUPROFEN 600 MG PO TABS
600.0000 mg | ORAL_TABLET | Freq: Three times a day (TID) | ORAL | Status: DC
  Administered 2020-06-20 – 2020-06-23 (×9): 600 mg via ORAL
  Filled 2020-06-20 (×9): qty 1

## 2020-06-20 MED ORDER — PANTOPRAZOLE SODIUM 40 MG PO TBEC
40.0000 mg | DELAYED_RELEASE_TABLET | Freq: Every day | ORAL | Status: DC
  Administered 2020-06-20 – 2020-06-29 (×10): 40 mg via ORAL
  Filled 2020-06-20 (×10): qty 1

## 2020-06-20 MED ORDER — OXYCODONE HCL ER 10 MG PO T12A
10.0000 mg | EXTENDED_RELEASE_TABLET | Freq: Two times a day (BID) | ORAL | Status: DC
  Administered 2020-06-20 – 2020-06-23 (×6): 10 mg via ORAL
  Filled 2020-06-20 (×6): qty 1
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
## 2020-06-21 NOTE — Progress Notes (Signed)
CSW met with the pt at the provider's request as pt was asking assistance with contacting his mother and sister and pt states the only way to contact them is via [employer]) [employer].  Pt provided verbal permission for the CSW to contact his sister Ziyon Soltau and his mother Ramar Nobrega who both live in [place] via [place].  CSW contacted both and provided the CSW's #, the information # for [facility], the pt's unit designation and the pt's room #, as well and asked them to get in touch with the CSW, the hospital or both.  CSW will continue to follow for D/C needs.  Alphonse Guild. Greydis Stlouis  MSW, LCSW, LCAS, CCS Transitions of Care Clinical Social Worker Care Coordination Department Ph: [phone]

## 2020-06-22 LAB — BASIC METABOLIC PANEL
Anion gap: 9 (ref 5–15)
BUN: 35 mg/dL — ABNORMAL HIGH (ref 6–20)
CO2: 25 mmol/L (ref 22–32)
Calcium: 8.7 mg/dL — ABNORMAL LOW (ref 8.9–10.3)
Chloride: 105 mmol/L (ref 98–111)
Creatinine, Ser: 1.11 mg/dL (ref 0.61–1.24)
GFR, Estimated: >60 mL/min (ref >60)
Glucose, Bld: 86 mg/dL (ref 70–99)
Potassium: 4.5 mmol/L (ref 3.5–5.1)
Sodium: 139 mmol/L (ref 135–145)
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
## 2020-06-23 MED ORDER — OXYCODONE HCL ER 10 MG PO T12A
10.0000 mg | EXTENDED_RELEASE_TABLET | Freq: Every morning | ORAL | Status: DC
  Administered 2020-06-24: 10 mg via ORAL
  Filled 2020-06-23: qty 1

## 2020-06-23 MED ORDER — IBUPROFEN 200 MG PO TABS
400.0000 mg | ORAL_TABLET | Freq: Three times a day (TID) | ORAL | Status: DC
  Administered 2020-06-23 – 2020-07-01 (×23): 400 mg via ORAL
  Filled 2020-06-23 (×24): qty 2

## 2020-06-23 NOTE — TOC Progression Note (Addendum)
Transition of Care [facility]) - Progression Note    Patient Details  Name: [name] MRN: [id number] Date of Birth: [birth year]  [facility]) CM/SW Contact  Carmina Miller, LCSWA Phone Number: 06/23/2020, 9:43 AM  Clinical Narrative:    Update 1:30 pm-CSW received phone call from June at [facility], stated [facility] would not be able to accept pt. June stated PT notes show pt is suffering from generalized weakness and has been recommended to SNF. June stated the notes suggest that pt is very deconditioned and suggest pt needs 24 hour supervision, feels that pt can't take care of himself. MD/NP made aware.   CSW reached out to [facility], spoke with June. June stated concern for pt still being on meds, stated they like to see pt off of meds for a couple of days to see how pt does with pain. Concern for pt's pain and if that will be an issue if/when he is able to come. At this time, pt is not a candidate for placement at [facility], but they will review medical paperwork. CSW sent an official referral for medical team to review. CSW will continue to follow.         Expected Discharge Plan and Services                                                 Social Determinants of Health (SDOH) Interventions Financial Strain Interventions: [id number] Referral Housing Interventions: [id number] Referral Stress Interventions: Intervention Not Indicated Social Connections Interventions: Intervention Not Indicated Transportation Interventions: [id number] Referral  Readmission Risk Interventions No flowsheet data found.

## 2020-06-23 NOTE — TOC Progression Note (Signed)
Transition of Care [facility]) - Progression Note    Patient Details  Name: [name] MRN: [id number] Date of Birth: 1990-10-08  Transition of Care [facility]) CM/SW Contact  Carmina Miller, LCSWA Phone Number: 06/23/2020, 4:44 PM  Clinical Narrative:    CSW attempted to reach out to pt's mother per the number on file, voicemail is for a "Roberta", pt's mom is named Coralee North, CSW still left a vm.         Expected Discharge Plan and Services                                                 Social Determinants of Health (SDOH) Interventions Financial Strain Interventions: [id number] Referral Housing Interventions: [id number] Referral Stress Interventions: Intervention Not Indicated Social Connections Interventions: Intervention Not Indicated Transportation Interventions: [id number] Referral  Readmission Risk Interventions No flowsheet data found.
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
## 2020-06-24 LAB — T4, FREE: Free T4: 0.53 ng/dL — ABNORMAL LOW (ref 0.61–1.12)

## 2020-06-24 LAB — TSH: TSH: 2.113 u[IU]/mL (ref 0.350–4.500)

## 2020-06-24 MED ORDER — METHOCARBAMOL 500 MG PO TABS
500.0000 mg | ORAL_TABLET | Freq: Three times a day (TID) | ORAL | Status: DC | PRN
  Administered 2020-06-24 – 2020-06-30 (×6): 500 mg via ORAL
  Filled 2020-06-24 (×6): qty 1

## 2020-06-24 MED ORDER — METHOCARBAMOL 500 MG PO TABS: 500.0000 mg | ORAL_TABLET | Freq: Three times a day (TID) | ORAL | Status: DC

## 2020-06-24 MED ORDER — OXYCODONE HCL 5 MG PO TABS
5.0000 mg | ORAL_TABLET | Freq: Two times a day (BID) | ORAL | Status: AC | PRN
  Administered 2020-06-24 – 2020-06-27 (×7): 5 mg via ORAL
  Filled 2020-06-24 (×8): qty 1
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
## 2020-06-25 LAB — T3, FREE: T3, Free: 2.2 pg/mL (ref 2.0–4.4)
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
## 2020-06-25 NOTE — Plan of Care (Signed)

## 2020-06-25 NOTE — TOC Progression Note (Signed)
Transition of Care [facility]) - Progression Note    Patient Details  Name: [name] MRN: [id number] Date of Birth: Jan 09, 1991  [facility] CM/SW Contact  Carmina Miller, LCSWA Phone Number: 06/25/2020, 2:06 PM  Clinical Narrative:    Finding placement for pt will be difficult due to pt's substance abuse issues, lack of insurance, and lack of money.   CSW received call from [name] [phone], same number we have for pt's mom [name], stated this is not her phone number and she doesn't know who [name] is.        Expected Discharge Plan and Services                                                 Social Determinants of Health (SDOH) Interventions Financial Strain Interventions: [id number] Referral Housing Interventions: [id number] Referral Stress Interventions: Intervention Not Indicated Social Connections Interventions: Intervention Not Indicated Transportation Interventions: [id number] Referral  Readmission Risk Interventions No flowsheet data found.

## 2020-06-26 NOTE — Progress Notes (Signed)
Complaint of "stabbing" back pain 10/10 on pain scale to lower back.  States he felt it "snap" when he arose from the toilet independently.  Refused assistance to reposition in bed.  Says "medication will not help".  Asked for xray, message to provider.
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
## 2020-06-27 MED ORDER — DICLOFENAC SODIUM 1 % EX GEL
4.0000 g | Freq: Four times a day (QID) | CUTANEOUS | Status: DC
  Administered 2020-06-27 – 2020-07-02 (×13): 4 g via TOPICAL
  Filled 2020-06-27: qty 100

## 2020-06-27 NOTE — Progress Notes (Signed)
Pt. found on the floor in the bathroom, states hes knees gave out on him. Pt had just walked in the hallway with physical therapy and stated he had did the stairs. Low Bed in place, yellow socks, and arm band in place. Bed alarm not set. Awaiting a call back from MD. Instructed pt he needs to have assistance to move about the room and bed alarm set.

## 2020-06-27 NOTE — Progress Notes (Signed)
NP responded no further testing.

## 2020-06-27 NOTE — TOC Progression Note (Signed)
Transition of Care [facility]) - Progression Note    Patient Details  Name: [name] MRN: [id number] Date of Birth: May 29, 1991  Transition of Care [facility]) CM/SW Contact  Carmina Miller, LCSWA Phone Number: 06/27/2020, 5:32 PM  Clinical Narrative:    CSW advised by RN that pt had another fall after PT left the room. CSW told by NP Junious Silk that pt would have all narcotics stopped by midnight tonight (prior to pt falling). CSW told by NP Revonda Standard that pt still on muscle relaxers. CSW reached back out to [facility] to see if they would reconsider taking pt as PT/OT and MD no longer recommending SNF for pt. CSW left vm with June at [facility] to see if they would be willing to reassess pt for possible admission.         Expected Discharge Plan and Services                                                 Social Determinants of Health (SDOH) Interventions Financial Strain Interventions: [id number] Referral Housing Interventions: [id number] Referral Stress Interventions: Intervention Not Indicated Social Connections Interventions: Intervention Not Indicated Transportation Interventions: [id number] Referral  Readmission Risk Interventions No flowsheet data found.
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
## 2020-06-27 NOTE — Progress Notes (Signed)
md paged to get tele sitter order. No response.
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
## 2020-06-28 NOTE — Plan of Care (Signed)
  Problem: Clinical Measurements: Goal: Will remain free from infection Outcome: Progressing   Problem: Coping: Goal: Level of anxiety will decrease Outcome: Progressing   Problem: Pain Managment: Goal: General experience of comfort will improve Outcome: Progressing   Problem: Safety: Goal: Ability to remain free from injury will improve Outcome: Progressing   

## 2020-06-29 LAB — CBC WITH DIFFERENTIAL/PLATELET
Abs Immature Granulocytes: 0.06 10*3/uL (ref 0.00–0.07)
Basophils Absolute: 0 10*3/uL (ref 0.0–0.1)
Basophils Relative: 0 %
Eosinophils Absolute: 0.2 10*3/uL (ref 0.0–0.5)
Eosinophils Relative: 4 %
HCT: 25.4 % — ABNORMAL LOW (ref 39.0–52.0)
Hemoglobin: 7.9 g/dL — ABNORMAL LOW (ref 13.0–17.0)
Immature Granulocytes: 1 %
Lymphocytes Relative: 39 %
Lymphs Abs: 1.9 10*3/uL (ref 0.7–4.0)
MCH: 28 pg (ref 26.0–34.0)
MCHC: 31.1 g/dL (ref 30.0–36.0)
MCV: 90.1 fL (ref 80.0–100.0)
Monocytes Absolute: 0.5 10*3/uL (ref 0.1–1.0)
Monocytes Relative: 10 %
Neutro Abs: 2.2 10*3/uL (ref 1.7–7.7)
Neutrophils Relative %: 46 %
Platelets: 280 10*3/uL (ref 150–400)
RBC: 2.82 MIL/uL — ABNORMAL LOW (ref 4.22–5.81)
RDW: 16.3 % — ABNORMAL HIGH (ref 11.5–15.5)
WBC: 4.8 10*3/uL (ref 4.0–10.5)
nRBC: 0 % (ref 0.0–0.2)

## 2020-06-29 LAB — COMPREHENSIVE METABOLIC PANEL
ALT: 158 U/L — ABNORMAL HIGH (ref 0–44)
AST: 81 U/L — ABNORMAL HIGH (ref 15–41)
Albumin: 2.7 g/dL — ABNORMAL LOW (ref 3.5–5.0)
Alkaline Phosphatase: 81 U/L (ref 38–126)
Anion gap: 8 (ref 5–15)
BUN: 34 mg/dL — ABNORMAL HIGH (ref 6–20)
CO2: 22 mmol/L (ref 22–32)
Calcium: 9.3 mg/dL (ref 8.9–10.3)
Chloride: 109 mmol/L (ref 98–111)
Creatinine, Ser: 1.13 mg/dL (ref 0.61–1.24)
GFR, Estimated: >60 mL/min (ref >60)
Glucose, Bld: 113 mg/dL — ABNORMAL HIGH (ref 70–99)
Potassium: 5.8 mmol/L — ABNORMAL HIGH (ref 3.5–5.1)
Sodium: 139 mmol/L (ref 135–145)
Total Bilirubin: 0.4 mg/dL (ref 0.3–1.2)
Total Protein: 6.8 g/dL (ref 6.5–8.1)

## 2020-06-29 LAB — IRON AND TIBC
Iron: 86 ug/dL (ref 45–182)
Saturation Ratios: 30 % (ref 17.9–39.5)
TIBC: 286 ug/dL (ref 250–450)
UIBC: 200 ug/dL

## 2020-06-29 LAB — SEDIMENTATION RATE: Sed Rate: 72 mm/hr — ABNORMAL HIGH (ref 0–16)

## 2020-06-29 LAB — FERRITIN: Ferritin: 361 ng/mL — ABNORMAL HIGH (ref 24–336)

## 2020-06-29 LAB — RETICULOCYTES
Immature Retic Fract: 16.3 % — ABNORMAL HIGH (ref 2.3–15.9)
RBC.: 2.85 MIL/uL — ABNORMAL LOW (ref 4.22–5.81)
Retic Count, Absolute: 179.8 10*3/uL (ref 19.0–186.0)
Retic Ct Pct: 6.3 % — ABNORMAL HIGH (ref 0.4–3.1)

## 2020-06-29 LAB — FOLATE: Folate: 9.4 ng/mL (ref >5.9)

## 2020-06-29 LAB — MAGNESIUM: Magnesium: 1.7 mg/dL (ref 1.7–2.4)

## 2020-06-29 LAB — POTASSIUM: Potassium: 6 mmol/L — ABNORMAL HIGH (ref 3.5–5.1)

## 2020-06-29 LAB — PHOSPHORUS: Phosphorus: 5.7 mg/dL — ABNORMAL HIGH (ref 2.5–4.6)

## 2020-06-29 MED ORDER — PANTOPRAZOLE SODIUM 40 MG IV SOLR
40.0000 mg | INTRAVENOUS | Status: DC
  Administered 2020-06-29: 40 mg via INTRAVENOUS
  Filled 2020-06-29: qty 40

## 2020-06-29 MED ORDER — SODIUM CHLORIDE 0.9 % IV SOLN: INTRAVENOUS | Status: AC

## 2020-06-29 MED ORDER — TRAMADOL HCL 50 MG PO TABS
50.0000 mg | ORAL_TABLET | Freq: Three times a day (TID) | ORAL | Status: DC | PRN
  Administered 2020-06-29 – 2020-07-01 (×3): 50 mg via ORAL
  Filled 2020-06-29 (×4): qty 1
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
## 2020-06-30 ENCOUNTER — Inpatient Hospital Stay (HOSPITAL_COMMUNITY): Payer: Self-pay

## 2020-06-30 LAB — COMPREHENSIVE METABOLIC PANEL
ALT: 160 U/L — ABNORMAL HIGH (ref 0–44)
AST: 71 U/L — ABNORMAL HIGH (ref 15–41)
Albumin: 2.9 g/dL — ABNORMAL LOW (ref 3.5–5.0)
Alkaline Phosphatase: 81 U/L (ref 38–126)
Anion gap: 6 (ref 5–15)
BUN: 31 mg/dL — ABNORMAL HIGH (ref 6–20)
CO2: 24 mmol/L (ref 22–32)
Calcium: 9.7 mg/dL (ref 8.9–10.3)
Chloride: 107 mmol/L (ref 98–111)
Creatinine, Ser: 1.03 mg/dL (ref 0.61–1.24)
GFR, Estimated: >60 mL/min (ref >60)
Glucose, Bld: 86 mg/dL (ref 70–99)
Potassium: 5.8 mmol/L — ABNORMAL HIGH (ref 3.5–5.1)
Sodium: 137 mmol/L (ref 135–145)
Total Bilirubin: 0.6 mg/dL (ref 0.3–1.2)
Total Protein: 7.3 g/dL (ref 6.5–8.1)

## 2020-06-30 LAB — CK: Total CK: 25 U/L — ABNORMAL LOW (ref 49–397)

## 2020-06-30 IMAGING — MR MR LUMBAR SPINE W/O CM
4 of 5 series · 26 of 48 positions shown · non-contrast
Comparison: CT [DATE]

CLINICAL DATA: Low back pain.  History of IV drug abuse

EXAM:
MRI LUMBAR SPINE WITHOUT CONTRAST
TECHNIQUE: Multiplanar, multisequence MR imaging of the lumbar spine was
performed. No intravenous contrast was administered.

[Series 5: T2 · sagittal · 4.0mm · 0.73mm/px · 6 of 16 slices shown (1 of 2)]
[im 1/16]
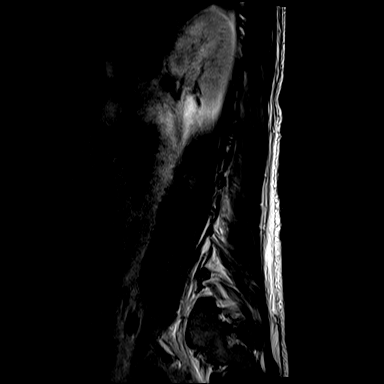
[im 4/16]
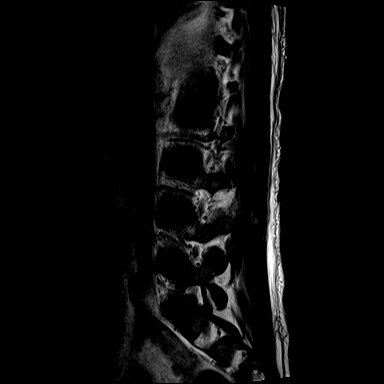
[im 7/16]
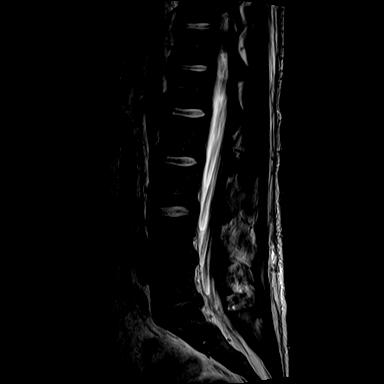
[im 10/16]
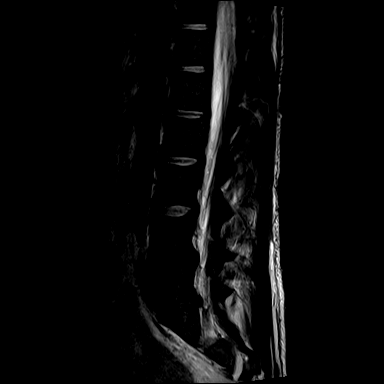
[im 13/16]
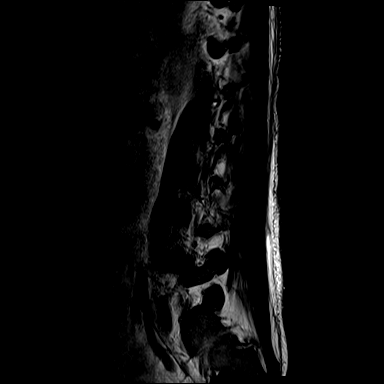
[im 16/16]
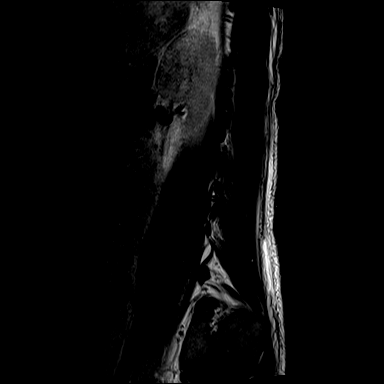

[Series 7: T1 · sagittal · 4.0mm · 0.88mm/px · 7 of 16 slices shown (1 of 2)]
[im 1/16]
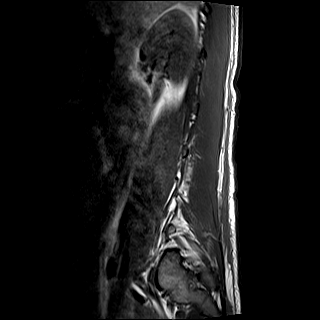
[im 3/16]
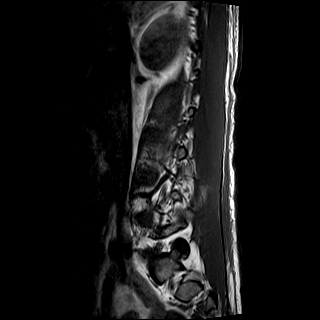
[im 6/16]
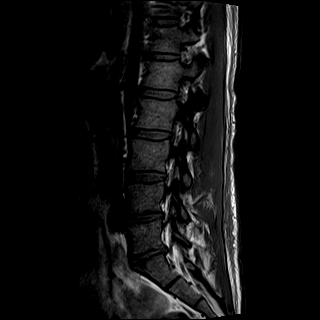
[im 8/16]
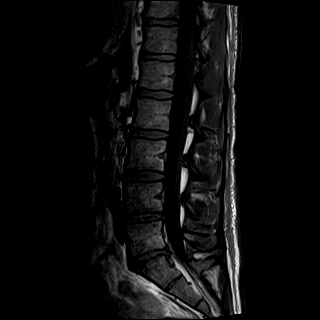
[im 11/16]
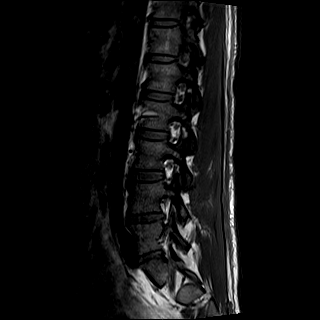
[im 13/16]
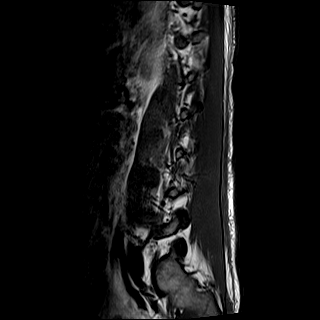
[im 16/16]
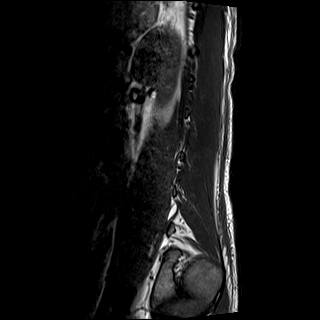

[Series 8: T2 · axial · 4.0mm · 0.57mm/px · z∈[-150,+48]mm · 8 of 32 slices shown (2 of 2)]
[im 1/32]
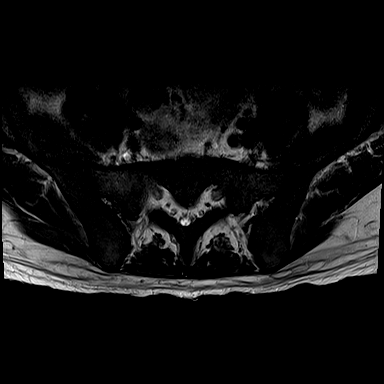
[im 5/32]
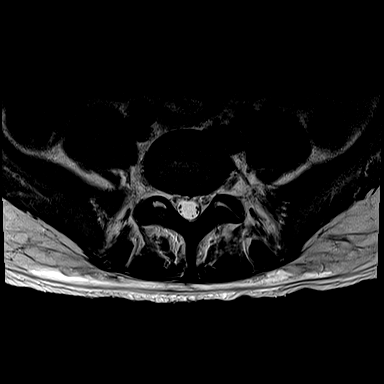
[im 10/32]
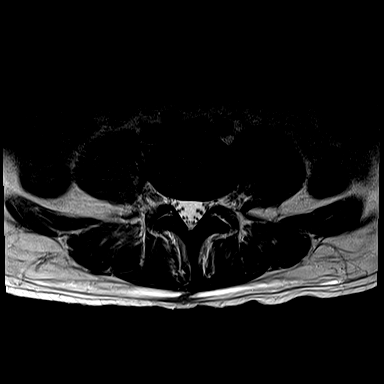
[im 15/32]
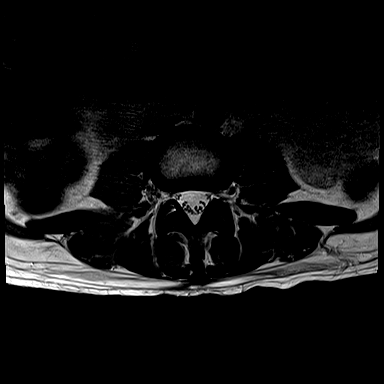
[im 17/32]
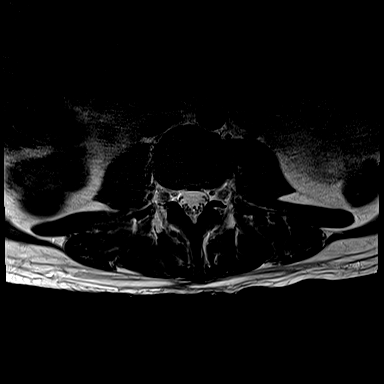
[im 22/32]
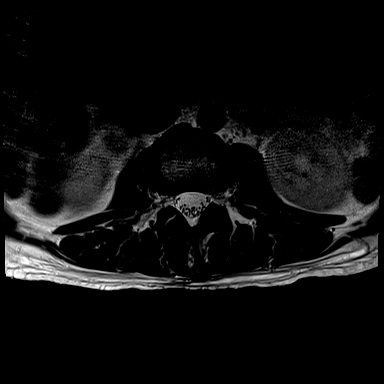
[im 27/32]
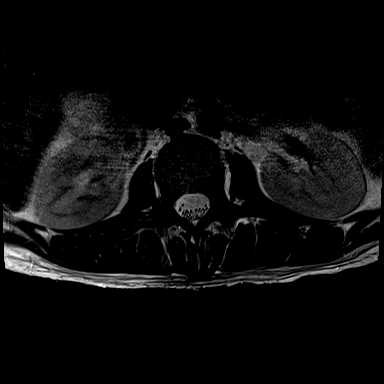
[im 32/32]
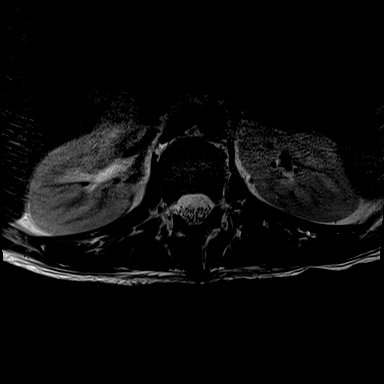

[Series 9: T1 · axial · 4.0mm · 0.34mm/px · z∈[-150,+23]mm · 5 of 32 slices shown (2 of 2)]
[im 1/32]
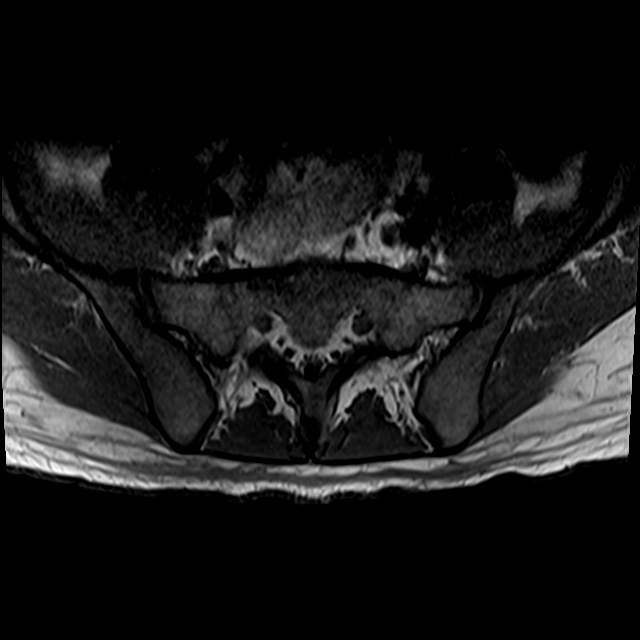
[im 5/32]
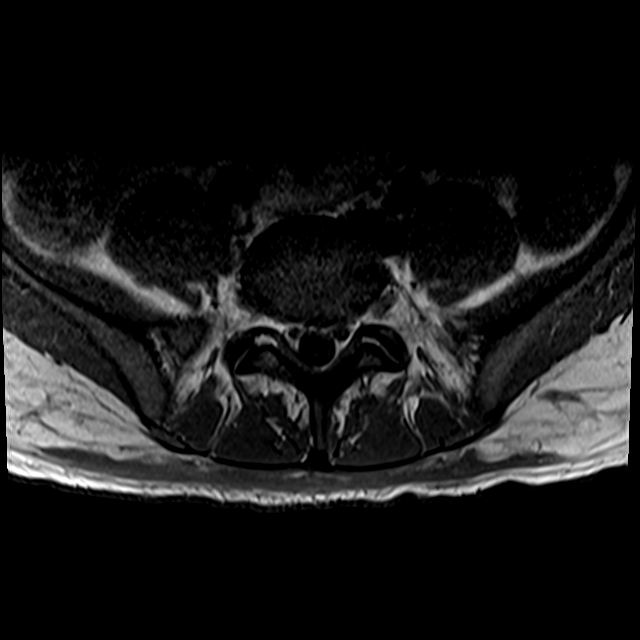
[im 10/32]
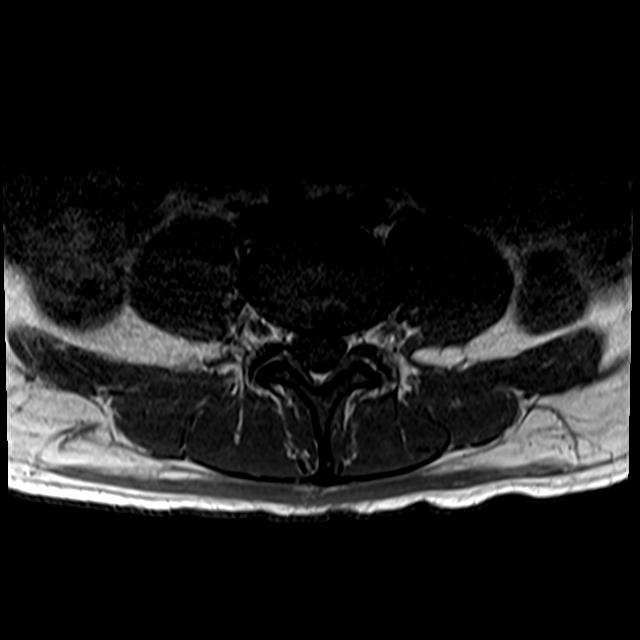
[im 17/32]
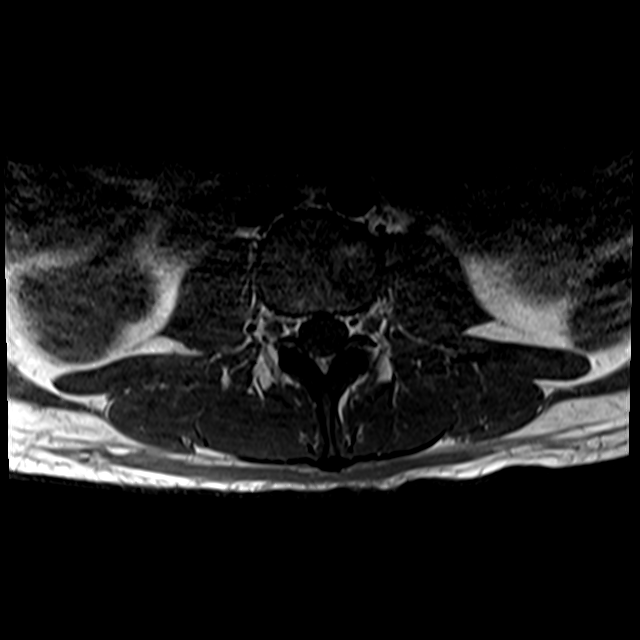
[im 27/32]
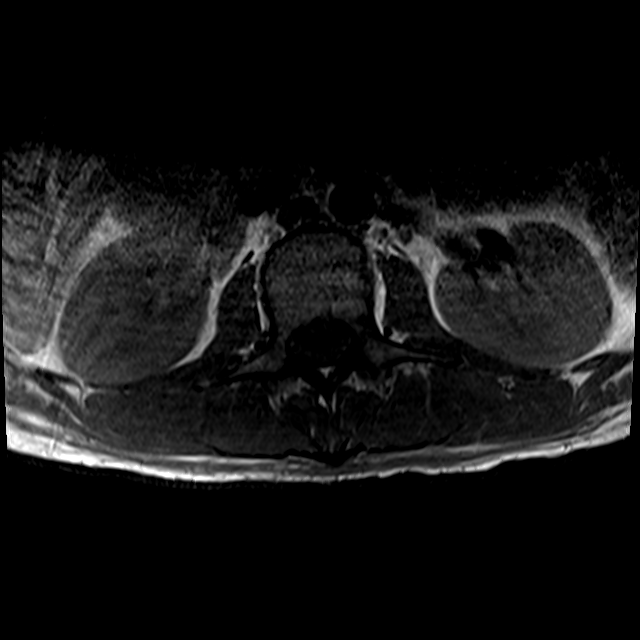

[26 of 48 positions shown; findings below may reference images not displayed]

FINDINGS: Segmentation:  Standard.

Alignment:  Physiologic.

Vertebrae: Negative for acute fracture. No evidence of discitis. No
bone marrow edema. No suspicious bone lesion.

Conus medullaris and cauda equina: Conus extends to the L1 level.
Conus and cauda equina appear normal.

Paraspinal and other soft tissues: Nonspecific dependent
subcutaneous edema. No soft tissue fluid collection. No paraspinal
muscle edema or fluid. No visualized retroperitoneal abnormality.

Disc levels:

T12-L1 through L3-L4: Unremarkable.

L4-L5: Mild disc desiccation.  No foraminal or canal stenosis.

L5-S1: Disc desiccation and height loss with posterior annular
fissure and focal left paracentral disc protrusion. Disc material
abuts the descending left S1 nerve root. No canal or foraminal
stenosis.
IMPRESSION: 1. No evidence of discitis or osteomyelitis of the lumbar spine.
2. Small focal left paracentral disc protrusion at L5-S1 abuts the
descending left S1 nerve root. Correlate for radicular symptoms in
this distribution.
3. No canal or foraminal stenosis at any level.

## 2020-06-30 MED ORDER — MUPIROCIN 2 % EX OINT
TOPICAL_OINTMENT | Freq: Two times a day (BID) | CUTANEOUS | Status: DC
  Administered 2020-06-30: 1 via NASAL
  Filled 2020-06-30 (×2): qty 22
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
## 2020-06-30 NOTE — TOC Progression Note (Signed)
Transition of Care [facility]) - Progression Note    Patient Details  Name: [name] MRN: [id number] Date of Birth: 06/24/1991  Transition of Care [facility]) CM/SW Contact  Carmina Miller, LCSWA Phone Number: 06/30/2020, 2:43 PM  Clinical Narrative:    CSW received phone call from June at [facility], stated pt is not medically stable for admission. NP, RNCM, and DCRN made aware.         Expected Discharge Plan and Services                                                 Social Determinants of Health (SDOH) Interventions Financial Strain Interventions: [id number] Referral Housing Interventions: [id number] Referral Stress Interventions: Intervention Not Indicated Social Connections Interventions: Intervention Not Indicated Transportation Interventions: [id number] Referral  Readmission Risk Interventions No flowsheet data found.
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
## 2020-06-30 NOTE — Progress Notes (Addendum)
NT noticed patient's toes are bleeding. Patient has ripped off all ten toenails and placed them in a cup for the RN to see. Patient is alert and oriented x 4 and there is no active bleeding from the toes. Patient toes has been cleansed, 4x4 gauze has been applied over the toes, toes wrapped in kerlex. Patient has also been educated on not disarming the bed alarm set in place for his safety. Patient verbalizes understanding. Call bell is within reach and bed alarm is on.
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
## 2020-06-30 NOTE — Plan of Care (Signed)
  Problem: Elimination: Goal: Will not experience complications related to bowel motility Outcome: Completed/Met Goal: Will not experience complications related to urinary retention Outcome: Completed/Met

## 2020-06-30 NOTE — TOC Progression Note (Signed)
Transition of Care [facility]) - Progression Note    Patient Details  Name: [name] MRN: [id number] Date of Birth: Apr 10, 1991  Transition of Care [facility]) CM/SW Contact  Carmina Miller, LCSWA Phone Number: 06/30/2020, 10:46 AM  Clinical Narrative:    CSW reached back out to June at [facility], requested another review of pt clinicals. June agreed, CSW sent updated clinicals from PT/OT, MD, and medications. CSW will continue to follow.        Expected Discharge Plan and Services                                                 Social Determinants of Health (SDOH) Interventions Financial Strain Interventions: [id number] Referral Housing Interventions: [id number] Referral Stress Interventions: Intervention Not Indicated Social Connections Interventions: Intervention Not Indicated Transportation Interventions: [id number] Referral  Readmission Risk Interventions No flowsheet data found.

## 2020-07-01 ENCOUNTER — Other Ambulatory Visit (HOSPITAL_COMMUNITY): Payer: Self-pay | Admitting: Nurse Practitioner

## 2020-07-01 ENCOUNTER — Telehealth: Payer: Self-pay | Admitting: *Deleted

## 2020-07-01 LAB — BASIC METABOLIC PANEL
Anion gap: 6 (ref 5–15)
BUN: 29 mg/dL — ABNORMAL HIGH (ref 6–20)
CO2: 27 mmol/L (ref 22–32)
Calcium: 9.7 mg/dL (ref 8.9–10.3)
Chloride: 103 mmol/L (ref 98–111)
Creatinine, Ser: 1.02 mg/dL (ref 0.61–1.24)
GFR, Estimated: >60 mL/min (ref >60)
Glucose, Bld: 86 mg/dL (ref 70–99)
Potassium: 5.5 mmol/L — ABNORMAL HIGH (ref 3.5–5.1)
Sodium: 136 mmol/L (ref 135–145)

## 2020-07-01 MED ORDER — TRAZODONE HCL 100 MG PO TABS: 100.0000 mg | ORAL_TABLET | Freq: Every evening | ORAL | 3 refills | Status: DC | PRN

## 2020-07-01 MED ORDER — MELATONIN 3 MG PO TABS: 3.0000 mg | ORAL_TABLET | Freq: Every day | ORAL | 0 refills | Status: AC

## 2020-07-01 MED ORDER — METHOCARBAMOL 500 MG PO TABS: 500.0000 mg | ORAL_TABLET | Freq: Three times a day (TID) | ORAL | 0 refills | Status: DC | PRN

## 2020-07-01 MED ORDER — ADULT MULTIVITAMIN W/MINERALS CH: 1.0000 | ORAL_TABLET | Freq: Every day | ORAL | Status: AC

## 2020-07-01 MED ORDER — HYDROXYZINE HCL 25 MG PO TABS
25.0000 mg | ORAL_TABLET | Freq: Three times a day (TID) | ORAL | 0 refills | Status: DC | PRN
Start: 2020-07-01 — End: 2020-07-01

## 2020-07-01 MED ORDER — PAROXETINE HCL 30 MG PO TABS
30.0000 mg | ORAL_TABLET | Freq: Every day | ORAL | 3 refills | Status: DC
Start: 2020-07-02 — End: 2020-07-02

## 2020-07-01 MED ORDER — DOXYCYCLINE HYCLATE 100 MG PO TABS
100.0000 mg | ORAL_TABLET | Freq: Two times a day (BID) | ORAL | 0 refills | Status: DC
Start: 2020-07-01 — End: 2020-07-02

## 2020-07-01 MED ORDER — DICLOFENAC SODIUM 1 % EX GEL: 4.0000 g | Freq: Four times a day (QID) | CUTANEOUS | 3 refills | Status: DC

## 2020-07-01 MED ORDER — ACETAMINOPHEN 325 MG PO TABS: 650.0000 mg | ORAL_TABLET | Freq: Four times a day (QID) | ORAL | Status: AC | PRN

## 2020-07-01 MED ORDER — TRAMADOL HCL 50 MG PO TABS: 50.0000 mg | ORAL_TABLET | Freq: Three times a day (TID) | ORAL | 0 refills | Status: AC | PRN

## 2020-07-01 MED FILL — DOXYCYCLINE HYCLATE 100 MG: 100 | 3 days supply | Qty: 6 | Fill #0

## 2020-07-01 MED FILL — traZODone HCL 100 MG TABS: 100 | 30 days supply | Qty: 30 | Fill #0

## 2020-07-01 MED FILL — traMADol HCL 50 MG TABS: 50 | 10 days supply | Qty: 30 | Fill #0

## 2020-07-01 MED FILL — PARoxetine HCL 30 MG TABS: 30 | 30 days supply | Qty: 30 | Fill #0

## 2020-07-01 MED FILL — METHOCARBAMOL 500 MG TABS: 500 | 10 days supply | Qty: 30 | Fill #0

## 2020-07-01 MED FILL — hydrOXYzine HCL 25 MG TABS: 25 | 10 days supply | Qty: 30 | Fill #0

## 2020-07-01 MED FILL — DICLOFENAC SODIUM 1 % GEL: 1 | 3 days supply | Qty: 100 | Fill #0
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
## 2020-07-01 NOTE — Telephone Encounter (Signed)
Pt calls and states he was told by his doctors to call clinic and get an appt. He states his disch is next week. Informed pt that nurse would need to make contact with his providers or csw. Spoke w/ csw and she states he is supposed to be disch tomorrow. Have ask her if pt was going to be prescribed suboxone, informed her that next open appt will be 11/16 at 0915.   Pt has been using for at least 6 years He has used weed, alcohol, pills and meth in the past. He hasnt used a lot of meth because he doesn't like it. His drug of choice is heroin.  His father is deceased due to alcoholism His mother is a functioning alcoholic He will be residing with the mother of his child and she has no addictions and will not allow him to stay if he does use. Per NP Rennis Harding he will be given enough tramadol to last till appt He will also seek mental health counseling at [facility]. Went over where OUD is located, where to call for assist, will see dr Mikey Bussing, urine samples, pharmacy and to apply for assist. Sending to dr's mullen, vincent, hoffman, guilloud, patel, opyd, ellisNP

## 2020-07-01 NOTE — Progress Notes (Signed)
   07/01/20 2041  Assess: MEWS Score  Temp 99.2 F (37.3 C)  BP 125/80  Pulse Rate (!) 112  Resp 18  SpO2 97 %  O2 Device Room Air  Assess: MEWS Score  MEWS Temp 0  MEWS Systolic 0  MEWS Pulse 2  MEWS RR 0  MEWS LOC 0  MEWS Score 2  MEWS Score Color Yellow  Assess: if the MEWS score is Yellow or Red  Were vital signs taken at a resting state? No  Focused Assessment No change from prior assessment  Early Detection of Sepsis Score *See Row Information* Low  MEWS guidelines implemented *See Row Information* No, vital signs rechecked  Document  Patient Outcome Other (Comment) (no intervention necessary)  Progress note created (see row info) Yes  Checked apical pulse to confirm - pt HR tachycardic in 100s but below 110. No intervention necessary at this time.
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
## 2020-07-02 DIAGNOSIS — F1121 Opioid dependence, in remission: Secondary | ICD-10-CM

## 2020-07-02 DIAGNOSIS — M609 Myositis, unspecified: Secondary | ICD-10-CM

## 2020-07-02 LAB — BASIC METABOLIC PANEL
Anion gap: 6 (ref 5–15)
BUN: 24 mg/dL — ABNORMAL HIGH (ref 6–20)
CO2: 28 mmol/L (ref 22–32)
Calcium: 9.9 mg/dL (ref 8.9–10.3)
Chloride: 103 mmol/L (ref 98–111)
Creatinine, Ser: 1.05 mg/dL (ref 0.61–1.24)
GFR, Estimated: >60 mL/min (ref >60)
Glucose, Bld: 84 mg/dL (ref 70–99)
Potassium: 5.1 mmol/L (ref 3.5–5.1)
Sodium: 137 mmol/L (ref 135–145)

## 2020-07-02 NOTE — Discharge Summary (Addendum)
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 06/30/20 0818  AST 81* 71*  ALT 158* 160*  ALKPHOS 81 81  BILITOT 0.4 0.6  PROT 6.8 7.3  ALBUMIN 2.7* 2.9*   No results for input(s): LIPASE, AMYLASE in the last 168 hours. No results for input(s): AMMONIA in the last 168 hours. CBC: Recent Labs  Lab 06/29/20 1030  WBC 4.8  NEUTROABS 2.2  HGB 7.9*  HCT 25.4*  MCV 90.1  PLT 280   Cardiac Enzymes: Recent Labs  Lab 06/30/20 0818  CKTOTAL 25*   BNP: BNP (last 3 results) No results for input(s): BNP in the last 8760 hours.  ProBNP (last 3 results) No results for input(s): PROBNP in the last 8760 hours.  CBG: No results for input(s): GLUCAP in the last 168 hours.     Signed:  Junious Silk ANP [facility] 07/02/2020, 11:37 AM

## 2020-07-02 NOTE — Plan of Care (Signed)
  Problem: Education: Goal: Knowledge of General Education information will improve Description: Including pain rating scale, medication(s)/side effects and non-pharmacologic comfort measures Outcome: Adequate for Discharge   Problem: Health Behavior/Discharge Planning: Goal: Ability to manage health-related needs will improve Outcome: Adequate for Discharge   Problem: Clinical Measurements: Goal: Ability to maintain clinical measurements within normal limits will improve Outcome: Adequate for Discharge Goal: Will remain free from infection Outcome: Adequate for Discharge Goal: Diagnostic test results will improve Outcome: Adequate for Discharge Goal: Respiratory complications will improve Outcome: Adequate for Discharge Goal: Cardiovascular complication will be avoided Outcome: Adequate for Discharge   Problem: Activity: Goal: Risk for activity intolerance will decrease Outcome: Adequate for Discharge   Problem: Coping: Goal: Level of anxiety will decrease Outcome: Adequate for Discharge   Problem: Pain Managment: Goal: General experience of comfort will improve Outcome: Adequate for Discharge   Problem: Safety: Goal: Ability to remain free from injury will improve Outcome: Adequate for Discharge   Problem: Skin Integrity: Goal: Risk for impaired skin integrity will decrease Outcome: Adequate for Discharge   
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
## 2020-07-02 NOTE — Telephone Encounter (Signed)
Agree with plan.  Can add him on to OUD schedule.

## 2020-07-02 NOTE — Progress Notes (Signed)
Pt remains A/Ox4. VSS. IV team consult placed for PICC line removal. Discharge paperwork in process. Pt states girlfriend will be here in 30 minutes to transport. Safety maintained. Bed lowered and locked, call light in reach and bed alarm activated. Will continue to monitor.

## 2020-07-02 NOTE — Progress Notes (Signed)
D/C instructions given and reviewed. No questions asked but encouraged to call with any concerns. Awaiting significant other for transport home.

## 2020-07-21 ENCOUNTER — Inpatient Hospital Stay: Payer: Self-pay | Admitting: Internal Medicine
